# Patient Record
Sex: Female | Born: 1985 | Race: Black or African American | Hispanic: No | Marital: Married | Smoking: Never smoker
Health system: Southern US, Community
[De-identification: ages and names within clinical notes are randomized; demographics above are authoritative.]

## PROBLEM LIST (undated history)

## (undated) ENCOUNTER — Inpatient Hospital Stay (HOSPITAL_COMMUNITY): Payer: Self-pay

## (undated) DIAGNOSIS — M25569 Pain in unspecified knee: Secondary | ICD-10-CM

## (undated) DIAGNOSIS — F419 Anxiety disorder, unspecified: Secondary | ICD-10-CM

## (undated) DIAGNOSIS — M2550 Pain in unspecified joint: Secondary | ICD-10-CM

## (undated) DIAGNOSIS — N979 Female infertility, unspecified: Secondary | ICD-10-CM

## (undated) DIAGNOSIS — N390 Urinary tract infection, site not specified: Secondary | ICD-10-CM

## (undated) DIAGNOSIS — R002 Palpitations: Secondary | ICD-10-CM

## (undated) DIAGNOSIS — K5900 Constipation, unspecified: Secondary | ICD-10-CM

## (undated) DIAGNOSIS — E559 Vitamin D deficiency, unspecified: Secondary | ICD-10-CM

## (undated) DIAGNOSIS — O139 Gestational [pregnancy-induced] hypertension without significant proteinuria, unspecified trimester: Secondary | ICD-10-CM

## (undated) DIAGNOSIS — D649 Anemia, unspecified: Secondary | ICD-10-CM

## (undated) DIAGNOSIS — N289 Disorder of kidney and ureter, unspecified: Secondary | ICD-10-CM

## (undated) DIAGNOSIS — M1990 Unspecified osteoarthritis, unspecified site: Secondary | ICD-10-CM

## (undated) DIAGNOSIS — O165 Unspecified maternal hypertension, complicating the puerperium: Secondary | ICD-10-CM

## (undated) HISTORY — DX: Female infertility, unspecified: N97.9

## (undated) HISTORY — DX: Unspecified osteoarthritis, unspecified site: M19.90

## (undated) HISTORY — DX: Pain in unspecified joint: M25.50

## (undated) HISTORY — DX: Palpitations: R00.2

## (undated) HISTORY — DX: Unspecified maternal hypertension, complicating the puerperium: O16.5

## (undated) HISTORY — DX: Urinary tract infection, site not specified: N39.0

## (undated) HISTORY — DX: Constipation, unspecified: K59.00

## (undated) HISTORY — DX: Vitamin D deficiency, unspecified: E55.9

## (undated) HISTORY — PX: INCISE AND DRAIN ABCESS: PRO64

## (undated) HISTORY — DX: Disorder of kidney and ureter, unspecified: N28.9

## (undated) HISTORY — DX: Anemia, unspecified: D64.9

## (undated) HISTORY — DX: Gestational (pregnancy-induced) hypertension without significant proteinuria, unspecified trimester: O13.9

## (undated) HISTORY — DX: Pain in unspecified knee: M25.569

## (undated) HISTORY — DX: Anxiety disorder, unspecified: F41.9

---

## 2016-08-28 ENCOUNTER — Encounter: Payer: Self-pay | Admitting: Family

## 2016-08-28 ENCOUNTER — Ambulatory Visit (INDEPENDENT_AMBULATORY_CARE_PROVIDER_SITE_OTHER): Payer: 59 | Admitting: Family

## 2016-08-28 VITALS — BP 116/70 | HR 49 | Temp 98.1°F | Ht 69.0 in | Wt 216.4 lb

## 2016-08-28 DIAGNOSIS — Z0000 Encounter for general adult medical examination without abnormal findings: Secondary | ICD-10-CM | POA: Insufficient documentation

## 2016-08-28 LAB — TSH: TSH: 0.85 u[IU]/mL (ref 0.35–4.50)

## 2016-08-28 LAB — CBC WITH DIFFERENTIAL/PLATELET
BASOS PCT: 0.7 % (ref 0.0–3.0)
Basophils Absolute: 0 10*3/uL (ref 0.0–0.1)
EOS PCT: 1.6 % (ref 0.0–5.0)
Eosinophils Absolute: 0.1 10*3/uL (ref 0.0–0.7)
HCT: 37.2 % (ref 36.0–46.0)
HEMOGLOBIN: 12.2 g/dL (ref 12.0–15.0)
LYMPHS PCT: 38.3 % (ref 12.0–46.0)
Lymphs Abs: 2.2 10*3/uL (ref 0.7–4.0)
MCHC: 32.8 g/dL (ref 30.0–36.0)
MCV: 84 fl (ref 78.0–100.0)
Monocytes Absolute: 0.4 10*3/uL (ref 0.1–1.0)
Monocytes Relative: 6.9 % (ref 3.0–12.0)
NEUTROS PCT: 52.5 % (ref 43.0–77.0)
Neutro Abs: 3 10*3/uL (ref 1.4–7.7)
Platelets: 202 10*3/uL (ref 150.0–400.0)
RBC: 4.43 Mil/uL (ref 3.87–5.11)
RDW: 13.6 % (ref 11.5–15.5)
WBC: 5.6 10*3/uL (ref 4.0–10.5)

## 2016-08-28 LAB — COMPREHENSIVE METABOLIC PANEL
ALBUMIN: 4.2 g/dL (ref 3.5–5.2)
ALK PHOS: 36 U/L — AB (ref 39–117)
ALT: 10 U/L (ref 0–35)
AST: 15 U/L (ref 0–37)
BUN: 16 mg/dL (ref 6–23)
CHLORIDE: 103 meq/L (ref 96–112)
CO2: 29 mEq/L (ref 19–32)
CREATININE: 0.77 mg/dL (ref 0.40–1.20)
Calcium: 9.5 mg/dL (ref 8.4–10.5)
GFR: 91.98 mL/min (ref >60.00)
Glucose, Bld: 89 mg/dL (ref 70–99)
Potassium: 3.7 mEq/L (ref 3.5–5.1)
SODIUM: 138 meq/L (ref 135–145)
TOTAL PROTEIN: 7 g/dL (ref 6.0–8.3)
Total Bilirubin: 0.4 mg/dL (ref 0.2–1.2)

## 2016-08-28 LAB — VITAMIN D 25 HYDROXY (VIT D DEFICIENCY, FRACTURES): VITD: 23 ng/mL — ABNORMAL LOW (ref 30.00–100.00)

## 2016-08-28 LAB — LIPID PANEL
CHOL/HDL RATIO: 3
Cholesterol: 173 mg/dL (ref 0–200)
HDL: 59.1 mg/dL (ref >39.00)
LDL Cholesterol: 106 mg/dL — ABNORMAL HIGH (ref 0–99)
NonHDL: 114.05
Triglycerides: 40 mg/dL (ref 0.0–149.0)
VLDL: 8 mg/dL (ref 0.0–40.0)

## 2016-08-28 LAB — HEMOGLOBIN A1C: HEMOGLOBIN A1C: 5.6 % (ref 4.6–6.5)

## 2016-08-28 NOTE — Progress Notes (Signed)
Pre visit review using our clinic review tool, if applicable. No additional management support is needed unless otherwise documented below in the visit note. 

## 2016-08-28 NOTE — Progress Notes (Signed)
Subjective:    Patient ID: Jamie Bean, female    DOB: 12-29-1985, 31 y.o.   MRN: [id number]  CC: Jamie Bean is a 31 y.o. female who presents today for physical exam.    HPI: Prior care at been at [facility] clinic in [place]  Trying to conceive. On PNVs. '  Always had low resting HR. Excercises daily.     Colorectal Cancer Screening: No early family history Breast Cancer Screening: No early family history Cervical Cancer Screening: UTD, per patient; [facility]        Tetanus - unsure  HIV Screening- Candidate for .  Labs: Screening labs today. Exercise: Gets regular exercise.  Alcohol use: occasinal Smoking/tobacco use: Nonsmoker.  Regular dental exams: In need of dental exam. Wears seat belt: Yes. Skin: no concerning skin lesions; no h/o skin cancer. Follows with dermatologist.     HISTORY:  No past medical history on file.  No past surgical history on file. Family History  Problem Relation Age of Onset  . Renal cancer Maternal Grandmother 70  . Colon cancer Neg Hx   . Breast cancer Neg Hx       ALLERGIES: Other  No current outpatient prescriptions on file prior to visit.   No current facility-administered medications on file prior to visit.     Social History  Substance Use Topics  . Smoking status: Never Smoker  . Smokeless tobacco: Never Used  . Alcohol use Yes     Comment: occasional    Review of Systems  Constitutional: Negative for chills, fever and unexpected weight change.  HENT: Negative for congestion.   Respiratory: Negative for cough.   Cardiovascular: Negative for chest pain, palpitations and leg swelling.  Gastrointestinal: Negative for nausea and vomiting.  Musculoskeletal: Negative for arthralgias and myalgias.  Skin: Negative for rash.  Neurological: Negative for dizziness, syncope and headaches.  Hematological: Negative for adenopathy.  Psychiatric/Behavioral: Negative for confusion.      Objective:      BP 116/70   Pulse (!) 49   Temp 98.1 F (36.7 C) (Oral)   Ht  (1.753 m)   Wt 216 lb 6.4 oz (98.2 kg)   SpO2 99%   BMI 31.96 kg/m   BP Readings from Last 3 Encounters:  08/28/16 116/70   Wt Readings from Last 3 Encounters:  08/28/16 216 lb 6.4 oz (98.2 kg)    Physical Exam  Constitutional: She appears well-developed and well-nourished.  Eyes: Conjunctivae are normal.  Neck: No thyroid mass and no thyromegaly present.  Cardiovascular: Normal rate, regular rhythm, normal heart sounds and normal pulses.   Pulmonary/Chest: Effort normal and breath sounds normal. She has no wheezes. She has no rhonchi. She has no rales. Right breast exhibits no inverted nipple, no mass, no nipple discharge, no skin change and no tenderness. Left breast exhibits no inverted nipple, no mass, no nipple discharge, no skin change and no tenderness. Breasts are symmetrical.  CBE performed.   Lymphadenopathy:       Head (right side): No submental, no submandibular, no tonsillar, no preauricular, no posterior auricular and no occipital adenopathy present.       Head (left side): No submental, no submandibular, no tonsillar, no preauricular, no posterior auricular and no occipital adenopathy present.    She has no cervical adenopathy.       Right cervical: No superficial cervical, no deep cervical and no posterior cervical adenopathy present.      Left cervical: No  superficial cervical, no deep cervical and no posterior cervical adenopathy present.    She has no axillary adenopathy.  Neurological: She is alert.  Skin: Skin is warm and dry.  Psychiatric: She has a normal mood and affect. Her speech is normal and behavior is normal. Thought content normal.  Vitals reviewed.      Assessment & Plan:   Problem List Items Addressed This Visit      Other   Routine physical examination - Primary    No early family history of breast cancer, colon cancer. Pap smear normal and up-to-date per patient. She  will continue to follow with OB/GYN. Encouraged continued exercise. Advise Tdap vaccine at local pharmacy. Breast exam performed today. Screening labs.      Relevant Orders   CBC with Differential/Platelet   Comprehensive metabolic panel   Hemoglobin A1c   Lipid panel   TSH   VITAMIN D 25 Hydroxy (Vit-D Deficiency, Fractures)       Ms. Folz does not currently have medications on file.   No orders of the defined types were placed in this encounter.   Return precautions given.   Risks, benefits, and alternatives of the medications and treatment plan prescribed today were discussed, and patient expressed understanding.   Education regarding symptom management and diagnosis given to patient on AVS.   Continue to follow with Rennie Plowman, FNP for routine health maintenance.   Suzi Roots and I agreed with plan.   Rennie Plowman, FNP

## 2016-08-28 NOTE — Patient Instructions (Addendum)
Pleasure meeting you  Stay on prenatal.  Labs today  Health Maintenance, Female Adopting a healthy lifestyle and getting preventive care can go a long way to promote health and wellness. Talk with your health care provider about what schedule of regular examinations is right for you. This is a good chance for you to check in with your provider about disease prevention and staying healthy. In between checkups, there are plenty of things you can do on your own. Experts have done a lot of research about which lifestyle changes and preventive measures are most likely to keep you healthy. Ask your health care provider for more information. Weight and diet Eat a healthy diet  Be sure to include plenty of vegetables, fruits, low-fat dairy products, and lean protein.  Do not eat a lot of foods high in solid fats, added sugars, or salt.  Get regular exercise. This is one of the most important things you can do for your health.  Most adults should exercise for at least 150 minutes each week. The exercise should increase your heart rate and make you sweat (moderate-intensity exercise).  Most adults should also do strengthening exercises at least twice a week. This is in addition to the moderate-intensity exercise. Maintain a healthy weight  Body mass index (BMI) is a measurement that can be used to identify possible weight problems. It estimates body fat based on height and weight. Your health care provider can help determine your BMI and help you achieve or maintain a healthy weight.  For females 75 years of age and older:  A BMI below 18.5 is considered underweight.  A BMI of 18.5 to 24.9 is normal.  A BMI of 25 to 29.9 is considered overweight.  A BMI of 30 and above is considered obese. Watch levels of cholesterol and blood lipids  You should start having your blood tested for lipids and cholesterol at 33 years of age, then have this test every 5 years.  You may need to have your  cholesterol levels checked more often if:  Your lipid or cholesterol levels are high.  You are older than 33 years of age.  You are at high risk for heart disease. Cancer screening Lung Cancer  Lung cancer screening is recommended for adults 27-23 years old who are at high risk for lung cancer because of a history of smoking.  A yearly low-dose CT scan of the lungs is recommended for people who:  Currently smoke.  Have quit within the past 15 years.  Have at least a 30-pack-year history of smoking. A pack year is smoking an average of one pack of cigarettes a day for 1 year.  Yearly screening should continue until it has been 15 years since you quit.  Yearly screening should stop if you develop a health problem that would prevent you from having lung cancer treatment. Breast Cancer  Practice breast self-awareness. This means understanding how your breasts normally appear and feel.  It also means doing regular breast self-exams. Let your health care provider know about any changes, no matter how small.  If you are in your 20s or 30s, you should have a clinical breast exam (CBE) by a health care provider every 1-3 years as part of a regular health exam.  If you are 42 or older, have a CBE every year. Also consider having a breast X-ray (mammogram) every year.  If you have a family history of breast cancer, talk to your health care provider about genetic screening.  screening.  If you are at high risk for breast cancer, talk to your health care provider about having an MRI and a mammogram every year.  Breast cancer gene (BRCA) assessment is recommended for women who have family members with BRCA-related cancers. BRCA-related cancers include:  Breast.  Ovarian.  Tubal.  Peritoneal cancers.  Results of the assessment will determine the need for genetic counseling and BRCA1 and BRCA2 testing. Cervical Cancer  Your health care provider may recommend that you be screened regularly for  cancer of the pelvic organs (ovaries, uterus, and vagina). This screening involves a pelvic examination, including checking for microscopic changes to the surface of your cervix (Pap test). You may be encouraged to have this screening done every 3 years, beginning at age 21.  For women ages 30-65, health care providers may recommend pelvic exams and Pap testing every 3 years, or they may recommend the Pap and pelvic exam, combined with testing for human papilloma virus (HPV), every 5 years. Some types of HPV increase your risk of cervical cancer. Testing for HPV may also be done on women of any age with unclear Pap test results.  Other health care providers may not recommend any screening for nonpregnant women who are considered low risk for pelvic cancer and who do not have symptoms. Ask your health care provider if a screening pelvic exam is right for you.  If you have had past treatment for cervical cancer or a condition that could lead to cancer, you need Pap tests and screening for cancer for at least 20 years after your treatment. If Pap tests have been discontinued, your risk factors (such as having a new sexual partner) need to be reassessed to determine if screening should resume. Some women have medical problems that increase the chance of getting cervical cancer. In these cases, your health care provider may recommend more frequent screening and Pap tests. Colorectal Cancer  This type of cancer can be detected and often prevented.  Routine colorectal cancer screening usually begins at 33 years of age and continues through 33 years of age.  Your health care provider may recommend screening at an earlier age if you have risk factors for colon cancer.  Your health care provider may also recommend using home test kits to check for hidden blood in the stool.  A small camera at the end of a tube can be used to examine your colon directly (sigmoidoscopy or colonoscopy). This is done to check for  the earliest forms of colorectal cancer.  Routine screening usually begins at age 50.  Direct examination of the colon should be repeated every 5-10 years through 33 years of age. However, you may need to be screened more often if early forms of precancerous polyps or small growths are found. Skin Cancer  Check your skin from head to toe regularly.  Tell your health care provider about any new moles or changes in moles, especially if there is a change in a mole's shape or color.  Also tell your health care provider if you have a mole that is larger than the size of a pencil eraser.  Always use sunscreen. Apply sunscreen liberally and repeatedly throughout the day.  Protect yourself by wearing long sleeves, pants, a wide-brimmed hat, and sunglasses whenever you are outside. Heart disease, diabetes, and high blood pressure  High blood pressure causes heart disease and increases the risk of stroke. High blood pressure is more likely to develop in:  People who have blood pressure   in the high end of the normal range (130-139/85-89 mm Hg).  People who are overweight or obese.  People who are African American.  If you are 18-39 years of age, have your blood pressure checked every 3-5 years. If you are 40 years of age or older, have your blood pressure checked every year. You should have your blood pressure measured twice-once when you are at a hospital or clinic, and once when you are not at a hospital or clinic. Record the average of the two measurements. To check your blood pressure when you are not at a hospital or clinic, you can use:  An automated blood pressure machine at a pharmacy.  A home blood pressure monitor.  If you are between 55 years and 79 years old, ask your health care provider if you should take aspirin to prevent strokes.  Have regular diabetes screenings. This involves taking a blood sample to check your fasting blood sugar level.  If you are at a normal weight and  have a low risk for diabetes, have this test once every three years after 33 years of age.  If you are overweight and have a high risk for diabetes, consider being tested at a younger age or more often. Preventing infection Hepatitis B  If you have a higher risk for hepatitis B, you should be screened for this virus. You are considered at high risk for hepatitis B if:  You were born in a country where hepatitis B is common. Ask your health care provider which countries are considered high risk.  Your parents were born in a high-risk country, and you have not been immunized against hepatitis B (hepatitis B vaccine).  You have HIV or AIDS.  You use needles to inject street drugs.  You live with someone who has hepatitis B.  You have had sex with someone who has hepatitis B.  You get hemodialysis treatment.  You take certain medicines for conditions, including cancer, organ transplantation, and autoimmune conditions. Hepatitis C  Blood testing is recommended for:  Everyone born from 1945 through 1965.  Anyone with known risk factors for hepatitis C. Sexually transmitted infections (STIs)  You should be screened for sexually transmitted infections (STIs) including gonorrhea and chlamydia if:  You are sexually active and are younger than 33 years of age.  You are older than 33 years of age and your health care provider tells you that you are at risk for this type of infection.  Your sexual activity has changed since you were last screened and you are at an increased risk for chlamydia or gonorrhea. Ask your health care provider if you are at risk.  If you do not have HIV, but are at risk, it may be recommended that you take a prescription medicine daily to prevent HIV infection. This is called pre-exposure prophylaxis (PrEP). You are considered at risk if:  You are sexually active and do not regularly use condoms or know the HIV status of your partner(s).  You take drugs by  injection.  You are sexually active with a partner who has HIV. Talk with your health care provider about whether you are at high risk of being infected with HIV. If you choose to begin PrEP, you should first be tested for HIV. You should then be tested every 3 months for as long as you are taking PrEP. Pregnancy  If you are premenopausal and you may become pregnant, ask your health care provider about preconception counseling.  If you   pregnant, take 400 to 800 micrograms (mcg) of folic acid every day.  If you want to prevent pregnancy, talk to your health care provider about birth control (contraception). Osteoporosis and menopause  Osteoporosis is a disease in which the bones lose minerals and strength with aging. This can result in serious bone fractures. Your risk for osteoporosis can be identified using a bone density scan.  If you are 78 years of age or older, or if you are at risk for osteoporosis and fractures, ask your health care provider if you should be screened.  Ask your health care provider whether you should take a calcium or vitamin D supplement to lower your risk for osteoporosis.  Menopause may have certain physical symptoms and risks.  Hormone replacement therapy may reduce some of these symptoms and risks. Talk to your health care provider about whether hormone replacement therapy is right for you. Follow these instructions at home:  Schedule regular health, dental, and eye exams.  Stay current with your immunizations.  Do not use any tobacco products including cigarettes, chewing tobacco, or electronic cigarettes.  If you are pregnant, do not drink alcohol.  If you are breastfeeding, limit how much and how often you drink alcohol.  Limit alcohol intake to no more than 1 drink per day for nonpregnant women. One drink equals 12 ounces of beer, 5 ounces of wine, or 1 ounces of hard liquor.  Do not use street drugs.  Do not share needles.  Ask  your health care provider for help if you need support or information about quitting drugs.  Tell your health care provider if you often feel depressed.  Tell your health care provider if you have ever been abused or do not feel safe at home. This information is not intended to replace advice given to you by your health care provider. Make sure you discuss any questions you have with your health care provider. Document Released: 11/07/2010 Document Revised: 09/30/2015 Document Reviewed: 01/26/2015 Elsevier Interactive Patient Education  2017 [employer].

## 2016-08-28 NOTE — Assessment & Plan Note (Addendum)
No early family history of breast cancer, colon cancer. Pap smear normal and up-to-date per patient. She will continue to follow with OB/GYN. Encouraged continued exercise. Advise Tdap vaccine at local pharmacy. Breast exam performed today. Screening labs.

## 2017-08-31 ENCOUNTER — Ambulatory Visit (INDEPENDENT_AMBULATORY_CARE_PROVIDER_SITE_OTHER): Payer: 59 | Admitting: Family

## 2017-08-31 ENCOUNTER — Other Ambulatory Visit: Payer: Self-pay | Admitting: Family

## 2017-08-31 ENCOUNTER — Encounter: Payer: 59 | Admitting: Family

## 2017-08-31 ENCOUNTER — Encounter: Payer: Self-pay | Admitting: Family

## 2017-08-31 VITALS — BP 110/82 | HR 60 | Temp 98.6°F | Ht 68.75 in | Wt 225.2 lb

## 2017-08-31 DIAGNOSIS — M25561 Pain in right knee: Secondary | ICD-10-CM | POA: Diagnosis not present

## 2017-08-31 DIAGNOSIS — G8929 Other chronic pain: Secondary | ICD-10-CM | POA: Diagnosis not present

## 2017-08-31 DIAGNOSIS — R7989 Other specified abnormal findings of blood chemistry: Secondary | ICD-10-CM

## 2017-08-31 DIAGNOSIS — Z0000 Encounter for general adult medical examination without abnormal findings: Secondary | ICD-10-CM | POA: Diagnosis not present

## 2017-08-31 DIAGNOSIS — Z23 Encounter for immunization: Secondary | ICD-10-CM | POA: Diagnosis not present

## 2017-08-31 DIAGNOSIS — M25562 Pain in left knee: Secondary | ICD-10-CM | POA: Diagnosis not present

## 2017-08-31 DIAGNOSIS — R001 Bradycardia, unspecified: Secondary | ICD-10-CM

## 2017-08-31 LAB — LIPID PANEL
CHOLESTEROL: 167 mg/dL (ref 0–200)
HDL: 51.8 mg/dL (ref >39.00)
LDL Cholesterol: 107 mg/dL — ABNORMAL HIGH (ref 0–99)
NONHDL: 114.79
TRIGLYCERIDES: 38 mg/dL (ref 0.0–149.0)
Total CHOL/HDL Ratio: 3
VLDL: 7.6 mg/dL (ref 0.0–40.0)

## 2017-08-31 LAB — VITAMIN D 25 HYDROXY (VIT D DEFICIENCY, FRACTURES): VITD: 57.03 ng/mL (ref 30.00–100.00)

## 2017-08-31 LAB — COMPREHENSIVE METABOLIC PANEL
ALBUMIN: 3.8 g/dL (ref 3.5–5.2)
ALK PHOS: 31 U/L — AB (ref 39–117)
ALT: 9 U/L (ref 0–35)
AST: 13 U/L (ref 0–37)
BUN: 14 mg/dL (ref 6–23)
CALCIUM: 8.8 mg/dL (ref 8.4–10.5)
CO2: 27 mEq/L (ref 19–32)
Chloride: 106 mEq/L (ref 96–112)
Creatinine, Ser: 0.82 mg/dL (ref 0.40–1.20)
GFR: 85.01 mL/min (ref >60.00)
Glucose, Bld: 90 mg/dL (ref 70–99)
Potassium: 4.3 mEq/L (ref 3.5–5.1)
Sodium: 140 mEq/L (ref 135–145)
TOTAL PROTEIN: 6.6 g/dL (ref 6.0–8.3)
Total Bilirubin: 0.4 mg/dL (ref 0.2–1.2)

## 2017-08-31 LAB — CBC WITH DIFFERENTIAL/PLATELET
BASOS PCT: 0.6 % (ref 0.0–3.0)
Basophils Absolute: 0 10*3/uL (ref 0.0–0.1)
Eosinophils Absolute: 0.1 10*3/uL (ref 0.0–0.7)
Eosinophils Relative: 1.8 % (ref 0.0–5.0)
HCT: 34.9 % — ABNORMAL LOW (ref 36.0–46.0)
HEMOGLOBIN: 11.6 g/dL — AB (ref 12.0–15.0)
Lymphocytes Relative: 24.1 % (ref 12.0–46.0)
Lymphs Abs: 1.2 10*3/uL (ref 0.7–4.0)
MCHC: 33.2 g/dL (ref 30.0–36.0)
MCV: 83.8 fl (ref 78.0–100.0)
MONOS PCT: 9.8 % (ref 3.0–12.0)
Monocytes Absolute: 0.5 10*3/uL (ref 0.1–1.0)
Neutro Abs: 3.3 10*3/uL (ref 1.4–7.7)
Neutrophils Relative %: 63.7 % (ref 43.0–77.0)
Platelets: 198 10*3/uL (ref 150.0–400.0)
RBC: 4.17 Mil/uL (ref 3.87–5.11)
RDW: 14 % (ref 11.5–15.5)
WBC: 5.2 10*3/uL (ref 4.0–10.5)

## 2017-08-31 LAB — HEMOGLOBIN A1C: HEMOGLOBIN A1C: 5.4 % (ref 4.6–6.5)

## 2017-08-31 LAB — TSH: TSH: 0.89 u[IU]/mL (ref 0.35–4.50)

## 2017-08-31 NOTE — Assessment & Plan Note (Signed)
Pap UTD and follows with OB GYN, defers pelvic exam today in the absence of complaints. Advised baseline mammogram in a couple of years. Tdap boosted. Weight loss referral placed. Recommendations for dental practices given to patient.

## 2017-08-31 NOTE — Progress Notes (Signed)
Subjective:    Patient ID: Jamie RootsShiquita Bean, female    DOB: [birth year], 32 y.o.   MRN: [id number]  CC: Jamie KanarisShiquita Renard Bean is a 32 y.o. female who presents today for physical exam.    HPI: Chronic bilateral knee pain- used to run half marathons; now running 4 days per week 3 miles. Gets arrevated by [employer]-with jumping. When looses weight, knee pain improves. Painful to sit on knees or sitting Bangladeshindian style. Some use of ice. No ibuprofen.   Bradycardia-  Mother has low heart rate as well. Denies exertional chest pain or pressure, numbness or tingling radiating to left arm or jaw, palpitations, dizziness, frequent headaches, changes in vision, or shortness of breath.   Notes her uncle recently passed away age 34, she thinks it was cardiac related.  Unsure if it was sudden cardiac death.  Currently undergoing infertility treatment.   Colorectal Cancer Screening: no early family history Breast Cancer Screening: no early family history Cervical Cancer Screening: UTD.  Bone Health screening/DEXA for 65+: No increased fracture risk. Defer screening at this time. Lung Cancer Screening: Doesn't have 30 year pack year history and age > 55 years.       Tetanus - due Labs: Screening labs today. Exercise: Gets regular exercise.  Alcohol use: occasional Smoking/tobacco use: Nonsmoker.  Regular dental exams: In need of dental exam. Wears seat belt: Yes. Skin: no new lesions; no h/o skin cancer.   HISTORY:  Past Medical History:  Diagnosis Date  . Arthritis   . UTI (urinary tract infection)     History reviewed. No pertinent surgical history. Family History  Problem Relation Age of Onset  . Renal cancer Maternal Grandmother 70  . Alcohol abuse Maternal Grandmother   . Hyperlipidemia Maternal Grandmother   . Hypertension Maternal Grandmother   . Arthritis Father   . Alcohol abuse Maternal Uncle   . Mental illness Maternal Uncle   . Mental illness Paternal Aunt   . Diabetes Paternal Aunt     . Hyperlipidemia Maternal Grandfather   . Hypertension Maternal Grandfather   . Stroke Paternal Grandmother   . Diabetes Paternal Grandmother   . Cancer Paternal Grandfather        lung  . Colon cancer Neg Hx   . Breast cancer Neg Hx       ALLERGIES: Other and Sulfa antibiotics  Current Outpatient Medications on File Prior to Visit  Medication Sig Dispense Refill  . Biotin 1 MG CAPS Take 1 mg by mouth daily.    . Cholecalciferol (VITAMIN D3) 5000 units TABS Take by mouth daily.    [name]. co-enzyme Q-10 30 MG capsule Take 30 mg by mouth daily.    . ferrous sulfate 325 (65 FE) MG tablet Take 325 mg by mouth daily.    . Lactobacillus (PROBIOTIC ACIDOPHILUS PO) Take 1 capsule by mouth daily.    . Multiple Vitamin (MULTIVITAMIN) tablet Take by mouth.     No current facility-administered medications on file prior to visit.     Social History   Tobacco Use  . Smoking status: Never Smoker  . Smokeless tobacco: Never Used  Substance Use Topics  . Alcohol use: Yes    Comment: occasional  . Drug use: No    Review of Systems  Constitutional: Negative for chills, fever and unexpected weight change.  HENT: Negative for congestion.   Respiratory: Negative for cough.   Cardiovascular: Negative for chest pain, palpitations and leg swelling.  Gastrointestinal: Negative for nausea and vomiting.  Genitourinary: Negative for pelvic pain and vaginal pain.  Musculoskeletal: Negative for arthralgias, joint swelling and myalgias.  Skin: Negative for rash.  Neurological: Negative for dizziness, syncope and headaches.  Hematological: Negative for adenopathy.  Psychiatric/Behavioral: Negative for confusion.      Objective:    BP 110/82 (BP Location: Left Arm, Patient Position: Sitting, Cuff Size: Large)   Pulse 60   Temp 98.6 F (37 C) (Oral)   Ht 5' 8.75" (1.746 m)   Wt 225 lb 4 oz (102.2 kg)   LMP 08/30/2017   SpO2 98%   BMI 33.51 kg/m   BP Readings from Last 3 Encounters:   08/31/17 110/82  08/28/16 116/70   Wt Readings from Last 3 Encounters:  08/31/17 225 lb 4 oz (102.2 kg)  08/28/16 216 lb 6.4 oz (98.2 kg)    Physical Exam  Constitutional: She appears well-developed and well-nourished.  Eyes: Conjunctivae are normal.  Neck: No thyroid mass and no thyromegaly present.  Cardiovascular: Regular rhythm, normal heart sounds and normal pulses. Bradycardia present.  Pulmonary/Chest: Effort normal and breath sounds normal. She has no wheezes. She has no rhonchi. She has no rales. Right breast exhibits no inverted nipple, no mass, no nipple discharge, no skin change and no tenderness. Left breast exhibits no inverted nipple, no mass, no nipple discharge, no skin change and no tenderness. Breasts are symmetrical.  CBE performed.   Musculoskeletal:       Right knee: She exhibits normal range of motion, no swelling and no effusion. No tenderness found.       Left knee: She exhibits normal range of motion, no swelling and no effusion. No tenderness found.  Lymphadenopathy:       Head (right side): No submental, no submandibular, no tonsillar, no preauricular, no posterior auricular and no occipital adenopathy present.       Head (left side): No submental, no submandibular, no tonsillar, no preauricular, no posterior auricular and no occipital adenopathy present.    She has no cervical adenopathy.       Right cervical: No superficial cervical, no deep cervical and no posterior cervical adenopathy present.      Left cervical: No superficial cervical, no deep cervical and no posterior cervical adenopathy present.    She has no axillary adenopathy.  Neurological: She is alert.  Skin: Skin is warm and dry.  Psychiatric: She has a normal mood and affect. Her speech is normal and behavior is normal. Thought content normal.  Vitals reviewed.      Assessment & Plan:   Problem List Items Addressed This Visit      Other   Routine physical examination - Primary    Pap  UTD and follows with OB GYN, defers pelvic exam today in the absence of complaints. Advised baseline mammogram in a couple of years. Tdap boosted. Weight loss referral placed. Recommendations for dental practices given to patient.       Relevant Orders   Amb Ref to Medical Weight Management   Ambulatory referral to Infertility   CBC with Differential/Platelet   Comprehensive metabolic panel   Hemoglobin A1c   Lipid panel   TSH   VITAMIN D 25 Hydroxy (Vit-D Deficiency, Fractures)   Ambulatory referral to Cardiology   Bradycardia    HR fluctuates from 49-60 during exam today. Suspect from avid exercise, history of running. However in context of uncles death and persistence, we agreed cardiac consult appropriate. Reassured as no syncope, CP, SOB. Will follow.  Relevant Orders   Ambulatory referral to Cardiology   Chronic pain of both knees    Chronic. Suspect overuse syndrome in context of weight gain. Politely declines imaging today. We agreed weight loss and conservative measures at home ( ice, ACE wrap) appropriate steps. If no improvement, she will let me know and we will consult sports medicine. Will follow      Relevant Orders   Amb Ref to Medical Weight Management   Need for diphtheria-tetanus-pertussis (Tdap) vaccine   Relevant Orders   Tdap vaccine greater than or equal to 7yo IM (Completed)       I am having Jamie Bean maintain her Lactobacillus (PROBIOTIC ACIDOPHILUS PO), Biotin, ferrous sulfate, multivitamin, Vitamin D3, and co-enzyme Q-10.   No orders of the defined types were placed in this encounter.   Return precautions given.   Risks, benefits, and alternatives of the medications and treatment plan prescribed today were discussed, and patient expressed understanding.   Education regarding symptom management and diagnosis given to patient on AVS.   Continue to follow with Allegra Grana, FNP for routine health maintenance.   Jamie Bean and I  agreed with plan.   Rennie Plowman, FNP

## 2017-08-31 NOTE — Assessment & Plan Note (Signed)
HR fluctuates from 49-60 during exam today. Suspect from avid exercise, history of running. However in context of uncles death and persistence, we agreed cardiac consult appropriate. Reassured as no syncope, CP, SOB. Will follow.

## 2017-08-31 NOTE — Patient Instructions (Addendum)
[facility]  ACE wrap, ice at least 20 minutes after workouts.  If no improvement, please let me know and we will consult sports medicine.  Today we discussed referrals, orders.  Cardiology , weight loss  management, infertility   I have placed these orders in the system for you.  Please be sure to give [place] a call if you have not heard from our office regarding scheduling a test or regarding referral in a timely manner.  It is very important that you let me know as soon as possible.   Health Maintenance, Female Adopting a healthy lifestyle and getting preventive care can go a long way to promote health and wellness. Talk with your health care provider about what schedule of regular examinations is right for you. This is a good chance for you to check in with your provider about disease prevention and staying healthy. In between checkups, there are plenty of things you can do on your own. Experts have done a lot of research about which lifestyle changes and preventive measures are most likely to keep you healthy. Ask your health care provider for more information. Weight and diet Eat a healthy diet  Be sure to include plenty of vegetables, fruits, low-fat dairy products, and lean protein.  Do not eat a lot of foods high in solid fats, added sugars, or salt.  Get regular exercise. This is one of the most important things you can do for your health. ? Most adults should exercise for at least 150 minutes each week. The exercise should increase your heart rate and make you sweat (moderate-intensity exercise). ? Most adults should also do strengthening exercises at least twice a week. This is in addition to the moderate-intensity exercise.  Maintain a healthy weight  Body mass index (BMI) is a measurement that can be used to identify possible weight problems. It estimates body fat based on height and weight. Your health care provider can help determine your BMI and  help you achieve or maintain a healthy weight.  For females 66 years of age and older: ? A BMI below 18.5 is considered underweight. ? A BMI of 18.5 to 24.9 is normal. ? A BMI of 25 to 29.9 is considered overweight. ? A BMI of 30 and above is considered obese.  Watch levels of cholesterol and blood lipids  You should start having your blood tested for lipids and cholesterol at 34 years of age, then have this test every 5 years.  You may need to have your cholesterol levels checked more often if: ? Your lipid or cholesterol levels are high. ? You are older than 34 years of age. ? You are at high risk for heart disease.  Cancer screening Lung Cancer  Lung cancer screening is recommended for adults 53-48 years old who are at high risk for lung cancer because of a history of smoking.  A yearly low-dose CT scan of the lungs is recommended for people who: ? Currently smoke. ? Have quit within the past 15 years. ? Have at least a 30-pack-year history of smoking. A pack year is smoking an average of one pack of cigarettes a day for 1 year.  Yearly screening should continue until it has been 15 years since you quit.  Yearly screening should stop if you develop a health problem that would prevent you from having lung cancer treatment.  Breast Cancer  Practice breast self-awareness. This means understanding how your breasts normally appear and feel.  feel.  It also means doing regular breast self-exams. Let your health care provider know about any changes, no matter how small.  If you are in your 20s or 30s, you should have a clinical breast exam (CBE) by a health care provider every 1-3 years as part of a regular health exam.  If you are 40 or older, have a CBE every year. Also consider having a breast X-ray (mammogram) every year.  If you have a family history of breast cancer, talk to your health care provider about genetic screening.  If you are at high risk for breast cancer, talk to  your health care provider about having an MRI and a mammogram every year.  Breast cancer gene (BRCA) assessment is recommended for women who have family members with BRCA-related cancers. BRCA-related cancers include: ? Breast. ? Ovarian. ? Tubal. ? Peritoneal cancers.  Results of the assessment will determine the need for genetic counseling and BRCA1 and BRCA2 testing.  Cervical Cancer Your health care provider may recommend that you be screened regularly for cancer of the pelvic organs (ovaries, uterus, and vagina). This screening involves a pelvic examination, including checking for microscopic changes to the surface of your cervix (Pap test). You may be encouraged to have this screening done every 3 years, beginning at age 21.  For women ages 30-65, health care providers may recommend pelvic exams and Pap testing every 3 years, or they may recommend the Pap and pelvic exam, combined with testing for human papilloma virus (HPV), every 5 years. Some types of HPV increase your risk of cervical cancer. Testing for HPV may also be done on women of any age with unclear Pap test results.  Other health care providers may not recommend any screening for nonpregnant women who are considered low risk for pelvic cancer and who do not have symptoms. Ask your health care provider if a screening pelvic exam is right for you.  If you have had past treatment for cervical cancer or a condition that could lead to cancer, you need Pap tests and screening for cancer for at least 20 years after your treatment. If Pap tests have been discontinued, your risk factors (such as having a new sexual partner) need to be reassessed to determine if screening should resume. Some women have medical problems that increase the chance of getting cervical cancer. In these cases, your health care provider may recommend more frequent screening and Pap tests.  Colorectal Cancer  This type of cancer can be detected and often  prevented.  Routine colorectal cancer screening usually begins at 34 years of age and continues through 34 years of age.  Your health care provider may recommend screening at an earlier age if you have risk factors for colon cancer.  Your health care provider may also recommend using home test kits to check for hidden blood in the stool.  A small camera at the end of a tube can be used to examine your colon directly (sigmoidoscopy or colonoscopy). This is done to check for the earliest forms of colorectal cancer.  Routine screening usually begins at age 50.  Direct examination of the colon should be repeated every 5-10 years through 34 years of age. However, you may need to be screened more often if early forms of precancerous polyps or small growths are found.  Skin Cancer  Check your skin from head to toe regularly.  Tell your health care provider about any new moles or changes in moles, especially if there   change in a mole's shape or color.  Also tell your health care provider if you have a mole that is larger than the size of a pencil eraser.  Always use sunscreen. Apply sunscreen liberally and repeatedly throughout the day.  Protect yourself by wearing long sleeves, pants, a wide-brimmed hat, and sunglasses whenever you are outside.  Heart disease, diabetes, and high blood pressure  High blood pressure causes heart disease and increases the risk of stroke. High blood pressure is more likely to develop in: ? People who have blood pressure in the high end of the normal range (130-139/85-89 mm Hg). ? People who are overweight or obese. ? People who are African American.  If you are 79-13 years of age, have your blood pressure checked every 3-5 years. If you are 37 years of age or older, have your blood pressure checked every year. You should have your blood pressure measured twice-once when you are at a hospital or clinic, and once when you are not at a hospital or clinic.  Record the average of the two measurements. To check your blood pressure when you are not at a hospital or clinic, you can use: ? An automated blood pressure machine at a pharmacy. ? A home blood pressure monitor.  If you are between 71 years and 51 years old, ask your health care provider if you should take aspirin to prevent strokes.  Have regular diabetes screenings. This involves taking a blood sample to check your fasting blood sugar level. ? If you are at a normal weight and have a low risk for diabetes, have this test once every three years after 34 years of age. ? If you are overweight and have a high risk for diabetes, consider being tested at a younger age or more often. Preventing infection Hepatitis B  If you have a higher risk for hepatitis B, you should be screened for this virus. You are considered at high risk for hepatitis B if: ? You were born in a country where hepatitis B is common. Ask your health care provider which countries are considered high risk. ? Your parents were born in a high-risk country, and you have not been immunized against hepatitis B (hepatitis B vaccine). ? You have HIV or AIDS. ? You use needles to inject street drugs. ? You live with someone who has hepatitis B. ? You have had sex with someone who has hepatitis B. ? You get hemodialysis treatment. ? You take certain medicines for conditions, including cancer, organ transplantation, and autoimmune conditions.  Hepatitis C  Blood testing is recommended for: ? Everyone born from [birth year] through 1967. ? Anyone with known risk factors for hepatitis C.  Sexually transmitted infections (STIs)  You should be screened for sexually transmitted infections (STIs) including gonorrhea and chlamydia if: ? You are sexually active and are younger than 34 years of age. ? You are older than 35 years of age and your health care provider tells you that you are at risk for this type of infection. ? Your sexual  activity has changed since you were last screened and you are at an increased risk for chlamydia or gonorrhea. Ask your health care provider if you are at risk.  If you do not have HIV, but are at risk, it may be recommended that you take a prescription medicine daily to prevent HIV infection. This is called pre-exposure prophylaxis (PrEP). You are considered at risk if: ? You are sexually active and do not regularly  use condoms or know the HIV status of your partner(s). ? You take drugs by injection. ? You are sexually active with a partner who has HIV.  Talk with your health care provider about whether you are at high risk of being infected with HIV. If you choose to begin PrEP, you should first be tested for HIV. You should then be tested every 3 months for as long as you are taking PrEP. Pregnancy  If you are premenopausal and you may become pregnant, ask your health care provider about preconception counseling.  If you may become pregnant, take 400 to 800 micrograms (mcg) of folic acid every day.  If you want to prevent pregnancy, talk to your health care provider about birth control (contraception). Osteoporosis and menopause  Osteoporosis is a disease in which the bones lose minerals and strength with aging. This can result in serious bone fractures. Your risk for osteoporosis can be identified using a bone density scan.  If you are 89 years of age or older, or if you are at risk for osteoporosis and fractures, ask your health care provider if you should be screened.  Ask your health care provider whether you should take a calcium or vitamin D supplement to lower your risk for osteoporosis.  Menopause may have certain physical symptoms and risks.  Hormone replacement therapy may reduce some of these symptoms and risks. Talk to your health care provider about whether hormone replacement therapy is right for you. Follow these instructions at home:  Schedule regular health, dental,  and eye exams.  Stay current with your immunizations.  Do not use any tobacco products including cigarettes, chewing tobacco, or electronic cigarettes.  If you are pregnant, do not drink alcohol.  If you are breastfeeding, limit how much and how often you drink alcohol.  Limit alcohol intake to no more than 1 drink per day for nonpregnant women. One drink equals 12 ounces of beer, 5 ounces of wine, or 1 ounces of hard liquor.  Do not use street drugs.  Do not share needles.  Ask your health care provider for help if you need support or information about quitting drugs.  Tell your health care provider if you often feel depressed.  Tell your health care provider if you have ever been abused or do not feel safe at home. This information is not intended to replace advice given to you by your health care provider. Make sure you discuss any questions you have with your health care provider. Document Released: 11/07/2010 Document Revised: 09/30/2015 Document Reviewed: 01/26/2015 Elsevier Interactive Patient Education  [employer].

## 2017-08-31 NOTE — Progress Notes (Signed)
close

## 2017-08-31 NOTE — Assessment & Plan Note (Signed)
Chronic. Suspect overuse syndrome in context of weight gain. Politely declines imaging today. We agreed weight loss and conservative measures at home ( ice, ACE wrap) appropriate steps. If no improvement, she will let me know and we will consult sports medicine. Will follow

## 2017-09-14 ENCOUNTER — Encounter: Payer: 59 | Admitting: Family

## 2017-09-25 ENCOUNTER — Encounter (INDEPENDENT_AMBULATORY_CARE_PROVIDER_SITE_OTHER): Payer: Self-pay

## 2017-10-09 ENCOUNTER — Ambulatory Visit (INDEPENDENT_AMBULATORY_CARE_PROVIDER_SITE_OTHER): Payer: 59 | Admitting: Family Medicine

## 2017-10-09 ENCOUNTER — Encounter (INDEPENDENT_AMBULATORY_CARE_PROVIDER_SITE_OTHER): Payer: Self-pay | Admitting: Family Medicine

## 2017-10-09 VITALS — BP 116/80 | HR 57 | Temp 97.9°F | Ht 69.0 in | Wt 218.0 lb

## 2017-10-09 DIAGNOSIS — E7849 Other hyperlipidemia: Secondary | ICD-10-CM

## 2017-10-09 DIAGNOSIS — Z9189 Other specified personal risk factors, not elsewhere classified: Secondary | ICD-10-CM | POA: Diagnosis not present

## 2017-10-09 DIAGNOSIS — E559 Vitamin D deficiency, unspecified: Secondary | ICD-10-CM | POA: Insufficient documentation

## 2017-10-09 DIAGNOSIS — R739 Hyperglycemia, unspecified: Secondary | ICD-10-CM | POA: Insufficient documentation

## 2017-10-09 DIAGNOSIS — Z0289 Encounter for other administrative examinations: Secondary | ICD-10-CM

## 2017-10-09 DIAGNOSIS — R0602 Shortness of breath: Secondary | ICD-10-CM | POA: Diagnosis not present

## 2017-10-09 DIAGNOSIS — E785 Hyperlipidemia, unspecified: Secondary | ICD-10-CM | POA: Insufficient documentation

## 2017-10-09 DIAGNOSIS — E669 Obesity, unspecified: Secondary | ICD-10-CM | POA: Diagnosis not present

## 2017-10-09 DIAGNOSIS — E782 Mixed hyperlipidemia: Secondary | ICD-10-CM | POA: Insufficient documentation

## 2017-10-09 DIAGNOSIS — Z1331 Encounter for screening for depression: Secondary | ICD-10-CM

## 2017-10-09 DIAGNOSIS — Z6832 Body mass index (BMI) 32.0-32.9, adult: Secondary | ICD-10-CM

## 2017-10-09 NOTE — Progress Notes (Signed)
Office: [phone]  /  Fax: [phone]   Dear Lyn Records. Arnett, FNP,   Thank you for referring [name] to our clinic. The following note includes my evaluation and treatment recommendations.  HPI:   Chief Complaint: OBESITY    Jamie Bean has been referred by Lyn Records. Jason Coop, FNP for consultation regarding her obesity and obesity related comorbidities.    Jamie Bean (MR# [id number]) is a 32 y.o. female who presents on 10/09/2017 for obesity evaluation and treatment. Current BMI is Body mass index is 32.19 kg/m.Jamie Bean has been struggling with her weight for many years and has been unsuccessful in either losing weight, maintaining weight loss, or reaching her healthy weight goal.     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 She states she has a history of exercise bulimia approximately 6 years ago.     Jamie Bean attended our information session and states she is currently in the action stage of change and ready to dedicate time achieving and maintaining a healthier weight. Jamie Bean is interested in becoming our patient and working on intensive lifestyle modifications including (but not limited to) diet, exercise and weight loss.    Jamie Bean states her family eats meals together she thinks her family will eat healthier with  her her desired weight loss is 33 lbs she has been heavy most of  her life she started gaining weight she has been overweight all her life her heaviest weight ever was 238 lbs she has significant food cravings issues  she is frequently drinking liquids with calories she frequently makes poor food choices she frequently eats larger portions than normal    Jamie Bean on exertion Jamie Bean notes increasing shortness of breath with exercising and seems to be worsening over time with weight gain. She notes getting out of breath sooner with activity than she used to. This has not gotten worse recently. Jamie Bean denies  orthopnea.  Hyperlipidemia Jamie Bean has hyperlipidemia and has been trying to improve her cholesterol levels with intensive lifestyle modification including a low saturated fat diet, exercise and weight loss. LDL slightly elevated in the past but HDL and triglycerides within normal limits, she is not on statin. She denies any chest pain, claudication or myalgias.  At risk for cardiovascular disease Jamie Bean is at a higher than average risk for cardiovascular disease due to obesity and hyperlipidemia. She currently denies any chest pain.  Vitamin D Deficiency Jamie Bean has a diagnosis of vitamin D deficiency. She is not on Vit D, no recent levels. She notes fatigue and denies nausea, vomiting or muscle weakness.  Hyperglycemia Jamie Bean has a history of previous A1c slightly elevated without a diagnosis of diabetes. She denies polyphagia.  Depression Screen Jamie Bean's Food and Mood (modified PHQ-9) score was  Depression screen PHQ 2/9 10/09/2017  Decreased Interest 1  Down, Depressed, Hopeless 1  PHQ - 2 Score 2  Altered sleeping 0  Tired, decreased energy 0  Change in appetite 0  Feeling bad or failure about yourself  1  Trouble concentrating 0  Moving slowly or fidgety/restless 0  Suicidal thoughts 0  PHQ-9 Score 3  Difficult doing work/chores Not difficult at all    ALLERGIES: Allergies  Allergen Reactions  . Other Hives  . Sulfa Antibiotics Hives    MEDICATIONS: Current Outpatient Medications on File Prior to Visit  Medication Sig Dispense Refill  . Biotin 1 MG CAPS Take 1 mg by mouth daily.    . Cholecalciferol (VITAMIN D3) 5000 units TABS Take by  mouth daily.    Jamie Kitchen co-enzyme Q-10 30 MG capsule Take 30 mg by mouth daily.    . ferrous sulfate 325 (65 FE) MG tablet Take 325 mg by mouth daily.    . Lactobacillus (PROBIOTIC ACIDOPHILUS PO) Take 1 capsule by mouth daily.    . Melatonin 1 MG CAPS Take 1 capsule by mouth at bedtime.    . Multiple Vitamin (MULTIVITAMIN) tablet  Take by mouth.     No current facility-administered medications on file prior to visit.     PAST MEDICAL HISTORY: Past Medical History:  Diagnosis Date  . Anemia   . Anxiety   . Arthritis   . Constipation   . Joint pain   . Kidney problem   . Knee pain   . Palpitations   . UTI (urinary tract infection)   . Vitamin D deficiency     PAST SURGICAL HISTORY: Past Surgical History:  Procedure Laterality Date  . INCISE AND DRAIN ABCESS     1994    SOCIAL HISTORY: Social History   Tobacco Use  . Smoking status: Never Smoker  . Smokeless tobacco: Never Used  Substance Use Topics  . Alcohol use: Yes    Comment: occasional  . Drug use: No    FAMILY HISTORY: Family History  Problem Relation Age of Onset  . Renal cancer Maternal Grandmother 70  . Alcohol abuse Maternal Grandmother   . Hyperlipidemia Maternal Grandmother   . Hypertension Maternal Grandmother   . Arthritis Father   . Obesity Father   . Alcohol abuse Maternal Uncle   . Mental illness Maternal Uncle   . Mental illness Paternal Aunt   . Diabetes Paternal Aunt   . Hyperlipidemia Maternal Grandfather   . Hypertension Maternal Grandfather   . Stroke Paternal Grandmother   . Diabetes Paternal Grandmother   . Cancer Paternal Grandfather        lung  . Colon cancer Neg Hx   . Breast cancer Neg Hx     ROS: Review of Systems  Constitutional: Positive for malaise/fatigue. Negative for weight loss.  HENT:       + Nasal stuffiness  Respiratory: Positive for cough and shortness of breath (with exertion).   Cardiovascular: Positive for palpitations. Negative for chest pain, orthopnea and claudication.  Gastrointestinal: Negative for nausea and vomiting.  Musculoskeletal:       Negative muscle weakness +Muscle or joint pain  Endo/Heme/Allergies:       + Heat/cold intolerance    PHYSICAL EXAM: Blood pressure 116/80, pulse (!) 57, temperature 97.9 F (36.6 C), temperature source Oral, height 5\' 9"  (1.753  m), weight 218 lb (98.9 kg), last menstrual period 09/26/2017, SpO2 99 %. Body mass index is 32.19 kg/m. Physical Exam  Constitutional: She is oriented to person, place, and time. She appears well-developed and well-nourished.  HENT:  Head: Normocephalic and atraumatic.  Nose: Nose normal.  Eyes: EOM are normal. No scleral icterus.  Neck: Normal range of motion. Neck supple. No thyromegaly present.  Cardiovascular: Normal rate and regular rhythm.  Pulmonary/Chest: Effort normal. No respiratory distress.  Abdominal: Soft. There is no tenderness.  + Obesity  Musculoskeletal:  Range of Motion normal in all 4 extremities Trace edema noted in bilateral lower extremities  Neurological: She is alert and oriented to person, place, and time. Coordination normal.  Skin: Skin is warm and dry.  Psychiatric: She has a normal mood and affect. Her behavior is normal.  Vitals reviewed.   RECENT LABS AND  TESTS: BMET    Component Value Date/Time   NA 140 08/31/2017 0929   K 4.3 08/31/2017 0929   CL 106 08/31/2017 0929   CO2 27 08/31/2017 0929   GLUCOSE 90 08/31/2017 0929   BUN 14 08/31/2017 0929   CREATININE 0.82 08/31/2017 0929   CALCIUM 8.8 08/31/2017 0929   Lab Results  Component Value Date   HGBA1C 5.4 08/31/2017   No results found for: INSULIN CBC    Component Value Date/Time   WBC 5.2 08/31/2017 0929   RBC 4.17 08/31/2017 0929   HGB 11.6 (L) 08/31/2017 0929   HCT 34.9 (L) 08/31/2017 0929   PLT 198.0 08/31/2017 0929   MCV 83.8 08/31/2017 0929   MCHC 33.2 08/31/2017 0929   RDW 14.0 08/31/2017 0929   LYMPHSABS 1.2 08/31/2017 0929   MONOABS 0.5 08/31/2017 0929   EOSABS 0.1 08/31/2017 0929   BASOSABS 0.0 08/31/2017 0929   Iron/TIBC/Ferritin/ %Sat No results found for: IRON, TIBC, FERRITIN, IRONPCTSAT Lipid Panel     Component Value Date/Time   CHOL 167 08/31/2017 0929   TRIG 38.0 08/31/2017 0929   HDL 51.80 08/31/2017 0929   CHOLHDL 3 08/31/2017 0929   VLDL 7.6  08/31/2017 0929   LDLCALC 107 (H) 08/31/2017 0929   Hepatic Function Panel     Component Value Date/Time   PROT 6.6 08/31/2017 0929   ALBUMIN 3.8 08/31/2017 0929   AST 13 08/31/2017 0929   ALT 9 08/31/2017 0929   ALKPHOS 31 (L) 08/31/2017 0929   BILITOT 0.4 08/31/2017 0929      Component Value Date/Time   TSH 0.89 08/31/2017 0929   TSH 0.85 08/28/2016 1402    ECG  shows NSR with a rate of 59 BPM INDIRECT CALORIMETER done today shows a VO2 of 210 and a REE of 1463.  Her calculated basal metabolic rate is 1610 thus her basal metabolic rate is worse than expected.    ASSESSMENT AND PLAN: Shortness of breath on exertion - Plan: EKG 12-Lead, CBC With Differential  Other hyperlipidemia - Plan: Lipid Panel With LDL/HDL Ratio  Vitamin D deficiency - Plan: Vitamin B12, Folate, VITAMIN D 25 Hydroxy (Vit-D Deficiency, Fractures)  Hyperglycemia - Plan: Hemoglobin A1c, Insulin, random, Comprehensive metabolic panel  Depression screening  At risk for heart disease  Class 1 obesity with serious comorbidity and body mass index (BMI) of 32.0 to 32.9 in adult, unspecified obesity type  PLAN:  Jamie Bean on exertion Reighlynn's shortness of breath appears to be obesity related and exercise induced. She has agreed to work on weight loss and gradually increase exercise to treat her exercise induced shortness of breath. If Tyia follows our instructions and loses weight without improvement of her shortness of breath, we will plan to refer to pulmonology. We will monitor this condition regularly. Anushri agrees to this plan.  Hyperlipidemia Yaniyah was informed of the American Heart Association Guidelines emphasizing intensive lifestyle modifications as the first line treatment for hyperlipidemia. We discussed many lifestyle modifications today in depth, and Kadiatou will continue to work on decreasing saturated fats such as fatty red meat, butter and many fried foods. She will start diet  prescription, also increase vegetables and lean protein in her diet and continue to work on exercise and weight loss efforts. We will check labs and Justene agrees to follow up with our clinic in 2 weeks.  Cardiovascular risk counselling Damian was given extended (15 minutes) coronary artery disease prevention counseling today. She is 32 y.o. female and has risk  factors for heart disease including obesity and hyperlipidemia. We discussed intensive lifestyle modifications today with an emphasis on specific weight loss instructions and strategies. Pt was also informed of the importance of increasing exercise and decreasing saturated fats to help prevent heart disease.  Vitamin D Deficiency Gracie was informed that low vitamin D levels contributes to fatigue and are associated with obesity, breast, and colon cancer. She will follow up for routine testing of vitamin D, at least 2-3 times per year. She was informed of the risk of over-replacement of vitamin D and agrees to not increase her dose unless she discusses this with us first. We will check labs and Janayah agrees to follow up with our clinic in 2 weeks.  Hyperglycemia Fasting labs will be obtained and results with be discussed with Synda in 2 weeks at her follow up visit. In the meanwhile Akia will start diet prescription and will work on weight loss efforts.  Depression Screen Nelia had a negative depression screening. Depression is commonly associated with obesity and often results in emotional eating behaviors. We will monitor this closely and work on CBT to help improve the non-hunger eating patterns. Referral to Psychology may be required if no improvement is seen as she continues in our clinic.  Obesity Cherae is currently in the action stage of change and her goal is to continue with weight loss efforts. I recommend Renalda begin the structured treatment plan as follows:  She has agreed to follow the Category 2 plan +  100 calories Celine has been instructed to eventually work up to a goal of 150 minutes of combined cardio and strengthening exercise per week for weight loss and overall health benefits. We discussed the following Behavioral Modification Strategies today: increasing lean protein intake, decreasing simple carbohydrates  and work on meal planning and easy cooking plans   She was informed of the importance of frequent follow up visits to maximize her success with intensive lifestyle modifications for her multiple health conditions. She was informed we would discuss her lab results at her next visit unless there is a critical issue that needs to be addressed sooner. Sonja agreed to keep her next visit at the agreed upon time to discuss these results.    OBESITY BEHAVIORAL INTERVENTION VISIT  Today's visit was # 1 out of 22.  Starting weight: 218 lbs Starting date: 10/09/17 Today's weight : 218 lbs Today's date: 10/09/2017 Total lbs lost to date: 0 (Patients must lose 7 lbs in the first 6 months to continue with counseling)   ASK: We discussed the diagnosis of obesity with Suzi RootsShiquita Bechler today and Ranae agreed to give us permission to discuss obesity behavioral modification therapy today.  ASSESS: Burnett KanarisShiquita has the diagnosis of obesity and her BMI today is 32.18 Arbell is in the action stage of change   ADVISE: Burnett KanarisShiquita was educated on the multiple health risks of obesity as well as the benefit of weight loss to improve her health. She was advised of the need for long term treatment and the importance of lifestyle modifications.  AGREE: Multiple dietary modification options and treatment options were discussed and  Shacoya agreed to the above obesity treatment plan.   I, Burt KnackSharon Martin, am acting as transcriptionist for Quillian Quincearen Saathvik Every, MD    I have reviewed the above documentation for accuracy and completeness, and I agree with the above. -Quillian Quincearen Tanishi Nault, MD

## 2017-10-10 LAB — COMPREHENSIVE METABOLIC PANEL
ALK PHOS: 37 IU/L — AB (ref 39–117)
ALT: 10 IU/L (ref 0–32)
AST: 16 IU/L (ref 0–40)
Albumin/Globulin Ratio: 1.7 (ref 1.2–2.2)
Albumin: 4.2 g/dL (ref 3.5–5.5)
BUN / CREAT RATIO: 16 (ref 9–23)
BUN: 13 mg/dL (ref 6–20)
Bilirubin Total: 0.3 mg/dL (ref 0.0–1.2)
CHLORIDE: 102 mmol/L (ref 96–106)
CO2: 24 mmol/L (ref 20–29)
CREATININE: 0.82 mg/dL (ref 0.57–1.00)
Calcium: 9 mg/dL (ref 8.7–10.2)
GFR calc Af Amer: 109 mL/min/{1.73_m2} (ref >59)
GFR calc non Af Amer: 94 mL/min/{1.73_m2} (ref >59)
GLUCOSE: 84 mg/dL (ref 65–99)
Globulin, Total: 2.5 g/dL (ref 1.5–4.5)
Potassium: 4.3 mmol/L (ref 3.5–5.2)
Sodium: 140 mmol/L (ref 134–144)
Total Protein: 6.7 g/dL (ref 6.0–8.5)

## 2017-10-10 LAB — CBC WITH DIFFERENTIAL
BASOS ABS: 0 10*3/uL (ref 0.0–0.2)
Basos: 0 %
EOS (ABSOLUTE): 0.1 10*3/uL (ref 0.0–0.4)
Eos: 2 %
HEMOGLOBIN: 11.8 g/dL (ref 11.1–15.9)
Hematocrit: 36 % (ref 34.0–46.6)
IMMATURE GRANS (ABS): 0 10*3/uL (ref 0.0–0.1)
IMMATURE GRANULOCYTES: 0 %
LYMPHS ABS: 1.5 10*3/uL (ref 0.7–3.1)
LYMPHS: 26 %
MCH: 26.6 pg (ref 26.6–33.0)
MCHC: 32.8 g/dL (ref 31.5–35.7)
MCV: 81 fL (ref 79–97)
MONOCYTES: 6 %
Monocytes Absolute: 0.3 10*3/uL (ref 0.1–0.9)
NEUTROS PCT: 66 %
Neutrophils Absolute: 3.8 10*3/uL (ref 1.4–7.0)
RBC: 4.43 x10E6/uL (ref 3.77–5.28)
RDW: 13.9 % (ref 12.3–15.4)
WBC: 5.7 10*3/uL (ref 3.4–10.8)

## 2017-10-10 LAB — HEMOGLOBIN A1C
Est. average glucose Bld gHb Est-mCnc: 111 mg/dL
HEMOGLOBIN A1C: 5.5 % (ref 4.8–5.6)

## 2017-10-10 LAB — LIPID PANEL WITH LDL/HDL RATIO
Cholesterol, Total: 185 mg/dL (ref 100–199)
HDL: 59 mg/dL (ref >39)
LDL CALC: 115 mg/dL — AB (ref 0–99)
LDl/HDL Ratio: 1.9 ratio (ref 0.0–3.2)
Triglycerides: 57 mg/dL (ref 0–149)
VLDL Cholesterol Cal: 11 mg/dL (ref 5–40)

## 2017-10-10 LAB — INSULIN, RANDOM: INSULIN: 5.8 u[IU]/mL (ref 2.6–24.9)

## 2017-10-10 LAB — VITAMIN D 25 HYDROXY (VIT D DEFICIENCY, FRACTURES): VIT D 25 HYDROXY: 77.6 ng/mL (ref 30.0–100.0)

## 2017-10-10 LAB — VITAMIN B12: VITAMIN B 12: 881 pg/mL (ref 232–1245)

## 2017-10-10 LAB — FOLATE: FOLATE: 19.3 ng/mL (ref >3.0)

## 2017-10-17 ENCOUNTER — Encounter: Payer: Self-pay | Admitting: Obstetrics and Gynecology

## 2017-10-20 NOTE — Progress Notes (Addendum)
Cardiology Office Note  Date:  10/23/2017   ID:  Jamie Bean, DOB 09/03/1985, MRN [id number]  PCP:  Allegra GranaArnett, Margaret G, FNP   Chief Complaint  Patient presents with  . other    Bradycardia c/o heart palpitations. Meds reviewed verbally with pt.    HPI:  Jamie Bean is a 32 year old woman with past medical history of SOB Obesity, morbid Who presents by referral from Rennie PlowmanMargaret Arnett for bradycardia  Prior office visit April 2018, With Rennie PlowmanMargaret Arnett Pulse rate 49 with blood pressure 116/70, reports that she was asymptomatic Periodically with heart rate in the high 50s  She does use a pulse monitor wristwatch when she works out heart rate frequent up to 140 bpm when she does heavy work out, Psychologist, educationalZumba. Denies any shortness of breath chest pain on exertion avid runner in the past  She is working with the [facility] to lose weight Changed her diet, exercising on a regular basis  Reports her uncle passed age 34, exact etiology unclear, possibly cardiac but details uncertain No other close relatives siblings parents with major cardiac issues  Reports mother may have low heart rate Is asymptomatic at separate 2:00 in the morning to go work out  EKG personally reviewed by myself on todays visit Shows normal sinus rhythm rate 63 bpm no significant ST or T-wave changes   PMH:   has a past medical history of Anemia, Anxiety, Arthritis, Constipation, Joint pain, Kidney problem, Knee pain, Palpitations, UTI (urinary tract infection), and Vitamin D deficiency.  PSH:    Past Surgical History:  Procedure Laterality Date  . INCISE AND DRAIN ABCESS     1994    Current Outpatient Medications  Medication Sig Dispense Refill  . Biotin 1 MG CAPS Take 1 mg by mouth daily.    . Cholecalciferol (VITAMIN D3) 5000 units TABS Take by mouth daily.    [name]. co-enzyme Q-10 30 MG capsule Take 30 mg by mouth daily.    . ferrous sulfate 325 (65 FE) MG tablet Take 325 mg by mouth daily.    .  Lactobacillus (PROBIOTIC ACIDOPHILUS PO) Take 1 capsule by mouth daily.    . Melatonin 1 MG CAPS Take 1 capsule by mouth at bedtime.    . Multiple Vitamin (MULTIVITAMIN) tablet Take by mouth.     No current facility-administered medications for this visit.      Allergies:   Other and Sulfa antibiotics   Social History:  The patient  reports that she has never smoked. She has never used smokeless tobacco. She reports that she drinks alcohol. She reports that she does not use drugs.   Family History:   family history includes Alcohol abuse in her maternal grandmother and maternal uncle; Arthritis in her father; Cancer in her paternal grandfather; Diabetes in her paternal aunt and paternal grandmother; Hyperlipidemia in her maternal grandfather and maternal grandmother; Hypertension in her maternal grandfather and maternal grandmother; Mental illness in her maternal uncle and paternal aunt; Obesity in her father; Renal cancer (age of onset: 2570) in her maternal grandmother; Stroke in her paternal grandmother.    Review of Systems: Review of Systems  Constitutional: Negative.   Respiratory: Negative.   Cardiovascular: Negative.   Gastrointestinal: Negative.   Musculoskeletal: Negative.   Neurological: Negative.   Psychiatric/Behavioral: Negative.   All other systems reviewed and are negative.    PHYSICAL EXAM: VS:  BP 116/70 (BP Location: Right Arm, Patient Position: Sitting, Cuff Size: Large)   Pulse 63   Ht  5\' 9"  (1.753 m)   Wt 217 lb 8 oz (98.7 kg)   LMP 09/26/2017 (Exact Date)   BMI 32.12 kg/m  , BMI Body mass index is 32.12 kg/m. GEN: Well nourished, well developed, in no acute distress  HEENT: normal  Neck: no JVD, carotid bruits, or masses Cardiac: RRR; no murmurs, rubs, or gallops,no edema  Respiratory:  clear to auscultation bilaterally, normal work of breathing GI: soft, nontender, nondistended, + BS MS: no deformity or atrophy  Skin: warm and dry, no rash Neuro:   Strength and sensation are intact Psych: euthymic mood, full affect    Recent Labs: 08/31/2017: Platelets 198.0; TSH 0.89 10/09/2017: ALT 10; BUN 13; Creatinine, Ser 0.82; Hemoglobin 11.8; Potassium 4.3; Sodium 140    Lipid Panel Lab Results  Component Value Date   CHOL 185 10/09/2017   HDL 59 10/09/2017   LDLCALC 115 (H) 10/09/2017   TRIG 57 10/09/2017      Wt Readings from Last 3 Encounters:  10/23/17 217 lb 8 oz (98.7 kg)  10/09/17 218 lb (98.9 kg)  08/31/17 225 lb 4 oz (102.2 kg)       ASSESSMENT AND PLAN:  Bradycardia - Plan: EKG 12-Lead Asymptomatic bradycardia No further workup needed at this time Good chronotropic competence, Especially noted with workouts  Hyperglycemia Recent lab work showing sugar 80s Complemented on recent weight loss and habits  Other hyperlipidemia Cholesterol within reasonable range, no medication needed No significant risk factors for cardiac disease  Shortness of breath on exertion - Plan: EKG 12-Lead Previous notes indicating shortness of breath on exertion but she reports having good exercise tolerance, aggressive workout regimen No further workup needed  Disposition:   F/U  As needed   Total encounter time more than 45 minutes  Greater than 50% was spent in counseling and coordination of care with the patient   Orders Placed This Encounter  Procedures  . EKG 12-Lead     Signed, Dossie Arbour, M.D., Ph.D. 10/23/2017  [facility] [place], [place] [phone]

## 2017-10-23 ENCOUNTER — Encounter: Payer: Self-pay | Admitting: Cardiovascular Disease

## 2017-10-23 ENCOUNTER — Ambulatory Visit: Payer: 59 | Admitting: Cardiovascular Disease

## 2017-10-23 VITALS — BP 116/70 | HR 63 | Ht 69.0 in | Wt 217.5 lb

## 2017-10-23 DIAGNOSIS — R0602 Shortness of breath: Secondary | ICD-10-CM

## 2017-10-23 DIAGNOSIS — R739 Hyperglycemia, unspecified: Secondary | ICD-10-CM

## 2017-10-23 DIAGNOSIS — E7849 Other hyperlipidemia: Secondary | ICD-10-CM

## 2017-10-23 DIAGNOSIS — R001 Bradycardia, unspecified: Secondary | ICD-10-CM

## 2017-10-23 NOTE — Patient Instructions (Signed)

## 2017-10-24 ENCOUNTER — Ambulatory Visit (INDEPENDENT_AMBULATORY_CARE_PROVIDER_SITE_OTHER): Payer: 59 | Admitting: Family Medicine

## 2017-10-24 VITALS — BP 110/71 | HR 56 | Temp 98.1°F | Ht 69.0 in | Wt 212.0 lb

## 2017-10-24 DIAGNOSIS — E669 Obesity, unspecified: Secondary | ICD-10-CM

## 2017-10-24 DIAGNOSIS — Z6831 Body mass index (BMI) 31.0-31.9, adult: Secondary | ICD-10-CM | POA: Diagnosis not present

## 2017-10-24 DIAGNOSIS — E7849 Other hyperlipidemia: Secondary | ICD-10-CM

## 2017-10-24 NOTE — Progress Notes (Signed)
Office: [phone]  /  Fax: [phone]   HPI:   Chief Complaint: OBESITY Jamie Bean is here to discuss her progress with her obesity treatment plan. She is on the Category 2 plan + 100 calories and is following her eating plan approximately 80 % of the time. She states she is doing zumba for 45-60 minutes 3-4 times per week. [name] has done well with weight loss on Category 2 plan. She had some celebration eating and temptations. She was able to eat all her food and hunger was mostly controlled. She is get bored with her plan.  Her weight is 212 lb (96.2 kg) today and has had a weight loss of 6 pounds over a period of 2 weeks since her last visit. She has lost 6 lbs since starting treatment with us.  Hyperlipidemia [name] has hyperlipidemia, her LDL is elevated but HDL and triglycerides are within normal limits. She is not on statin and she denies  any chest pain, claudication or myalgias. She would like to try to improve her cholesterol levels with intensive lifestyle modification including a low saturated fat diet, exercise and weight loss.   ALLERGIES: Allergies  Allergen Reactions  . Other Hives  . Sulfa Antibiotics Hives    MEDICATIONS: Current Outpatient Medications on File Prior to Visit  Medication Sig Dispense Refill  . Biotin 1 MG CAPS Take 1 mg by mouth daily.    . Cholecalciferol (VITAMIN D3) 5000 units TABS Take by mouth daily.    [name]. co-enzyme Q-10 30 MG capsule Take 30 mg by mouth daily.    . ferrous sulfate 325 (65 FE) MG tablet Take 325 mg by mouth daily.    . Lactobacillus (PROBIOTIC ACIDOPHILUS PO) Take 1 capsule by mouth daily.    . Melatonin 1 MG CAPS Take 1 capsule by mouth at bedtime.    . Multiple Vitamin (MULTIVITAMIN) tablet Take by mouth.     No current facility-administered medications on file prior to visit.     PAST MEDICAL HISTORY: Past Medical History:  Diagnosis Date  . Anemia   . Anxiety   . Arthritis   . Constipation   . Joint pain     . Kidney problem   . Knee pain   . Palpitations   . UTI (urinary tract infection)   . Vitamin D deficiency     PAST SURGICAL HISTORY: Past Surgical History:  Procedure Laterality Date  . INCISE AND DRAIN ABCESS     1994    SOCIAL HISTORY: Social History   Tobacco Use  . Smoking status: Never Smoker  . Smokeless tobacco: Never Used  Substance Use Topics  . Alcohol use: Yes    Comment: occasional  . Drug use: No    FAMILY HISTORY: Family History  Problem Relation Age of Onset  . Renal cancer Maternal Grandmother 70  . Alcohol abuse Maternal Grandmother   . Hyperlipidemia Maternal Grandmother   . Hypertension Maternal Grandmother   . Arthritis Father   . Obesity Father   . Alcohol abuse Maternal Uncle   . Mental illness Maternal Uncle   . Mental illness Paternal Aunt   . Diabetes Paternal Aunt   . Hyperlipidemia Maternal Grandfather   . Hypertension Maternal Grandfather   . Stroke Paternal Grandmother   . Diabetes Paternal Grandmother   . Cancer Paternal Grandfather        lung  . Colon cancer Neg Hx   . Breast cancer Neg Hx     ROS: Review  of Systems  Constitutional: Positive for weight loss.  Cardiovascular: Negative for chest pain and claudication.  Musculoskeletal: Negative for myalgias.    PHYSICAL EXAM: Blood pressure 110/71, pulse (!) 56, temperature 98.1 F (36.7 C), temperature source Oral, height 5\' 9"  (1.753 m), weight 212 lb (96.2 kg), last menstrual period 09/26/2017, SpO2 99 %. Body mass index is 31.31 kg/m. Physical Exam  Constitutional: She is oriented to person, place, and time. She appears well-developed and well-nourished.  Cardiovascular: Normal rate.  Pulmonary/Chest: Effort normal.  Musculoskeletal: Normal range of motion.  Neurological: She is oriented to person, place, and time.  Skin: Skin is warm and dry.  Psychiatric: She has a normal mood and affect. Her behavior is normal.  Vitals reviewed.   RECENT LABS AND  TESTS: BMET    Component Value Date/Time   NA 140 10/09/2017 0910   K 4.3 10/09/2017 0910   CL 102 10/09/2017 0910   CO2 24 10/09/2017 0910   GLUCOSE 84 10/09/2017 0910   GLUCOSE 90 08/31/2017 0929   BUN 13 10/09/2017 0910   CREATININE 0.82 10/09/2017 0910   CALCIUM 9.0 10/09/2017 0910   GFRNONAA 94 10/09/2017 0910   GFRAA 109 10/09/2017 0910   Lab Results  Component Value Date   HGBA1C 5.5 10/09/2017   HGBA1C 5.4 08/31/2017   HGBA1C 5.6 08/28/2016   Lab Results  Component Value Date   INSULIN 5.8 10/09/2017   CBC    Component Value Date/Time   WBC 5.7 10/09/2017 0910   WBC 5.2 08/31/2017 0929   RBC 4.43 10/09/2017 0910   RBC 4.17 08/31/2017 0929   HGB 11.8 10/09/2017 0910   HCT 36.0 10/09/2017 0910   PLT 198.0 08/31/2017 0929   MCV 81 10/09/2017 0910   MCH 26.6 10/09/2017 0910   MCHC 32.8 10/09/2017 0910   MCHC 33.2 08/31/2017 0929   RDW 13.9 10/09/2017 0910   LYMPHSABS 1.5 10/09/2017 0910   MONOABS 0.5 08/31/2017 0929   EOSABS 0.1 10/09/2017 0910   BASOSABS 0.0 10/09/2017 0910   Iron/TIBC/Ferritin/ %Sat No results found for: IRON, TIBC, FERRITIN, IRONPCTSAT Lipid Panel     Component Value Date/Time   CHOL 185 10/09/2017 0910   TRIG 57 10/09/2017 0910   HDL 59 10/09/2017 0910   CHOLHDL 3 08/31/2017 0929   VLDL 7.6 08/31/2017 0929   LDLCALC 115 (H) 10/09/2017 0910   Hepatic Function Panel     Component Value Date/Time   PROT 6.7 10/09/2017 0910   ALBUMIN 4.2 10/09/2017 0910   AST 16 10/09/2017 0910   ALT 10 10/09/2017 0910   ALKPHOS 37 (L) 10/09/2017 0910   BILITOT 0.3 10/09/2017 0910      Component Value Date/Time   TSH 0.89 08/31/2017 0929   TSH 0.85 08/28/2016 1402    ASSESSMENT AND PLAN: Other hyperlipidemia  Class 1 obesity with serious comorbidity and body mass index (BMI) of 31.0 to 31.9 in adult, unspecified obesity type  PLAN:  Hyperlipidemia [name] was informed of the American Heart Association Guidelines emphasizing  intensive lifestyle modifications as the first line treatment for hyperlipidemia. We discussed many lifestyle modifications today in depth, and Honey will continue to work on decreasing saturated fats such as fatty red meat, butter and many fried foods. She will also increase vegetables and lean protein in her diet and continue to work on diet, exercise, and weight loss efforts. We will recheck labs in 3 months and Lunabelle agrees to follow up with our clinic in 3 weeks.  We  spent > than 50% of the 30 minute visit on the counseling as documented in the note.  Obesity Azariyah is currently in the action stage of change. As such, her goal is to continue with weight loss efforts She has agreed to follow the Category 2 plan + 100 calories Daphne has been instructed to work up to a goal of 150 minutes of combined cardio and strengthening exercise per week for weight loss and overall health benefits. We discussed the following Behavioral Modification Strategies today: increasing lean protein intake, decreasing simple carbohydrates  and work on meal planning and easy cooking plans   Iriel has agreed to follow up with our clinic in 3 weeks. She was informed of the importance of frequent follow up visits to maximize her success with intensive lifestyle modifications for her multiple health conditions.   OBESITY BEHAVIORAL INTERVENTION VISIT  Today's visit was # 2 out of 22.  Starting weight: 218 lbs Starting date: 10/09/17 Today's weight : 212 lbs Today's date: 10/24/2017 Total lbs lost to date: 6 (Patients must lose 7 lbs in the first 6 months to continue with counseling)   ASK: We discussed the diagnosis of obesity with Suzi Roots today and Matea agreed to give [place] permission to discuss obesity behavioral modification therapy today.  ASSESS: Meighan has the diagnosis of obesity and her BMI today is 31.29 Srishti is in the action stage of change   ADVISE: Haadiya was educated  on the multiple health risks of obesity as well as the benefit of weight loss to improve her health. She was advised of the need for long term treatment and the importance of lifestyle modifications.  AGREE: Multiple dietary modification options and treatment options were discussed and  Brightyn agreed to the above obesity treatment plan.  I, Burt Knack, am acting as transcriptionist for Quillian Quince, MD  I have reviewed the above documentation for accuracy and completeness, and I agree with the above. -Quillian Quince, MD

## 2017-11-07 ENCOUNTER — Ambulatory Visit (INDEPENDENT_AMBULATORY_CARE_PROVIDER_SITE_OTHER): Payer: 59 | Admitting: Family Medicine

## 2017-11-07 VITALS — BP 111/73 | HR 62 | Temp 98.0°F | Ht 69.0 in | Wt 213.0 lb

## 2017-11-07 DIAGNOSIS — E669 Obesity, unspecified: Secondary | ICD-10-CM

## 2017-11-07 DIAGNOSIS — Z6831 Body mass index (BMI) 31.0-31.9, adult: Secondary | ICD-10-CM | POA: Diagnosis not present

## 2017-11-07 DIAGNOSIS — E7849 Other hyperlipidemia: Secondary | ICD-10-CM | POA: Diagnosis not present

## 2017-11-07 NOTE — Progress Notes (Signed)
Office: [phone]  /  Fax: [phone]   HPI:   Chief Complaint: OBESITY Jamie Bean is here to discuss her progress with her obesity treatment plan. She is on the Category 2 plan + 100 calories and is following her eating plan approximately 60 % of the time. She states she is doing zumba for 45-60 minutes 3-4 times per week. Jamie Bean had increase in traveling for work and tried to stick to plan but found it difficult. May have gotten a bit comfortable with making exceptions.  Her weight is 213 lb (96.6 kg) today and has gained 1 pound since her last visit. She has lost 5 lbs since starting treatment with [place].  Hyperlipidemia Jamie Bean has hyperlipidemia and has been trying to improve her cholesterol levels with intensive lifestyle modification including a low saturated fat diet, exercise and weight loss. She is not on medications currently. She denies any chest pain, claudication or myalgias.  ALLERGIES: Allergies  Allergen Reactions  . Other Hives  . Sulfa Antibiotics Hives    MEDICATIONS: Current Outpatient Medications on File Prior to Visit  Medication Sig Dispense Refill  . Biotin 1 MG CAPS Take 1 mg by mouth daily.    . Cholecalciferol (VITAMIN D3) 5000 units TABS Take by mouth daily.    [name] co-enzyme Q-10 30 MG capsule Take 30 mg by mouth daily.    . ferrous sulfate 325 (65 FE) MG tablet Take 325 mg by mouth daily.    . Lactobacillus (PROBIOTIC ACIDOPHILUS PO) Take 1 capsule by mouth daily.    . Melatonin 1 MG CAPS Take 1 capsule by mouth at bedtime.    . Multiple Vitamin (MULTIVITAMIN) tablet Take by mouth.     No current facility-administered medications on file prior to visit.     PAST MEDICAL HISTORY: Past Medical History:  Diagnosis Date  . Anemia   . Anxiety   . Arthritis   . Constipation   . Joint pain   . Kidney problem   . Knee pain   . Palpitations   . UTI (urinary tract infection)   . Vitamin D deficiency     PAST SURGICAL HISTORY: Past Surgical  History:  Procedure Laterality Date  . INCISE AND DRAIN ABCESS     1994    SOCIAL HISTORY: Social History   Tobacco Use  . Smoking status: Never Smoker  . Smokeless tobacco: Never Used  Substance Use Topics  . Alcohol use: Yes    Comment: occasional  . Drug use: No    FAMILY HISTORY: Family History  Problem Relation Age of Onset  . Renal cancer Maternal Grandmother 70  . Alcohol abuse Maternal Grandmother   . Hyperlipidemia Maternal Grandmother   . Hypertension Maternal Grandmother   . Arthritis Father   . Obesity Father   . Alcohol abuse Maternal Uncle   . Mental illness Maternal Uncle   . Mental illness Paternal Aunt   . Diabetes Paternal Aunt   . Hyperlipidemia Maternal Grandfather   . Hypertension Maternal Grandfather   . Stroke Paternal Grandmother   . Diabetes Paternal Grandmother   . Cancer Paternal Grandfather        lung  . Colon cancer Neg Hx   . Breast cancer Neg Hx     ROS: Review of Systems  Constitutional: Negative for weight loss.  Cardiovascular: Negative for chest pain and claudication.  Musculoskeletal: Negative for myalgias.    PHYSICAL EXAM: Blood pressure 111/73, pulse 62, temperature 98 F (36.7 C), temperature source  Oral, height 5\' 9"  (1.753 m), weight 213 lb (96.6 kg), SpO2 98 %. Body mass index is 31.45 kg/m. Physical Exam  Constitutional: She is oriented to person, place, and time. She appears well-developed and well-nourished.  Cardiovascular: Normal rate.  Pulmonary/Chest: Effort normal.  Musculoskeletal: Normal range of motion.  Neurological: She is oriented to person, place, and time.  Skin: Skin is warm and dry.  Psychiatric: She has a normal mood and affect. Her behavior is normal.  Vitals reviewed.   RECENT LABS AND TESTS: BMET    Component Value Date/Time   NA 140 10/09/2017 0910   K 4.3 10/09/2017 0910   CL 102 10/09/2017 0910   CO2 24 10/09/2017 0910   GLUCOSE 84 10/09/2017 0910   GLUCOSE 90 08/31/2017 0929     BUN 13 10/09/2017 0910   CREATININE 0.82 10/09/2017 0910   CALCIUM 9.0 10/09/2017 0910   GFRNONAA 94 10/09/2017 0910   GFRAA 109 10/09/2017 0910   Lab Results  Component Value Date   HGBA1C 5.5 10/09/2017   HGBA1C 5.4 08/31/2017   HGBA1C 5.6 08/28/2016   Lab Results  Component Value Date   INSULIN 5.8 10/09/2017   CBC    Component Value Date/Time   WBC 5.7 10/09/2017 0910   WBC 5.2 08/31/2017 0929   RBC 4.43 10/09/2017 0910   RBC 4.17 08/31/2017 0929   HGB 11.8 10/09/2017 0910   HCT 36.0 10/09/2017 0910   PLT 198.0 08/31/2017 0929   MCV 81 10/09/2017 0910   MCH 26.6 10/09/2017 0910   MCHC 32.8 10/09/2017 0910   MCHC 33.2 08/31/2017 0929   RDW 13.9 10/09/2017 0910   LYMPHSABS 1.5 10/09/2017 0910   MONOABS 0.5 08/31/2017 0929   EOSABS 0.1 10/09/2017 0910   BASOSABS 0.0 10/09/2017 0910   Iron/TIBC/Ferritin/ %Sat No results found for: IRON, TIBC, FERRITIN, IRONPCTSAT Lipid Panel     Component Value Date/Time   CHOL 185 10/09/2017 0910   TRIG 57 10/09/2017 0910   HDL 59 10/09/2017 0910   CHOLHDL 3 08/31/2017 0929   VLDL 7.6 08/31/2017 0929   LDLCALC 115 (H) 10/09/2017 0910   Hepatic Function Panel     Component Value Date/Time   PROT 6.7 10/09/2017 0910   ALBUMIN 4.2 10/09/2017 0910   AST 16 10/09/2017 0910   ALT 10 10/09/2017 0910   ALKPHOS 37 (L) 10/09/2017 0910   BILITOT 0.3 10/09/2017 0910      Component Value Date/Time   TSH 0.89 08/31/2017 0929   TSH 0.85 08/28/2016 1402    ASSESSMENT AND PLAN: Other hyperlipidemia  Class 1 obesity with serious comorbidity and body mass index (BMI) of 31.0 to 31.9 in adult, unspecified obesity type  PLAN:  Hyperlipidemia Maeson was informed of the American Heart Association Guidelines emphasizing intensive lifestyle modifications as the first line treatment for hyperlipidemia. We discussed many lifestyle modifications today in depth, and Jamie Bean will continue to work on decreasing saturated fats such as  fatty red meat, butter and many fried foods. She will continue Category 2 plan and she will also increase vegetables and lean protein in her diet and continue to work on exercise and weight loss efforts. Jamie Bean agrees to follow up with our clinic in 2 weeks.  We spent > than 50% of the 15 minute visit on the counseling as documented in the note.  Obesity Jamie Bean is currently in the action stage of change. As such, her goal is to continue with weight loss efforts She has agreed to follow  the Category 2 plan Jamie Bean has been instructed to work up to a goal of 150 minutes of combined cardio and strengthening exercise per week for weight loss and overall health benefits. We discussed the following Behavioral Modification Strategies today: increasing lean protein intake, increasing vegetables, work on meal planning and easy cooking plans, travel eating strategies, and planning for success   Jamie Bean has agreed to follow up with our clinic in 2 weeks. She was informed of the importance of frequent follow up visits to maximize her success with intensive lifestyle modifications for her multiple health conditions.   OBESITY BEHAVIORAL INTERVENTION VISIT  Today's visit was # 3 out of 22.  Starting weight: 218 lbs Starting date: 10/09/17 Today's weight : 213 lbs Today's date: 11/07/2017 Total lbs lost to date: 5 (Patients must lose 7 lbs in the first 6 months to continue with counseling)   ASK: We discussed the diagnosis of obesity with Jamie Bean today and Jamie Bean agreed to give [place] permission to discuss obesity behavioral modification therapy today.  ASSESS: Jamie Bean has the diagnosis of obesity and her BMI today is 31.44 Jamie Bean is in the action stage of change   ADVISE: Jamie Bean was educated on the multiple health risks of obesity as well as the benefit of weight loss to improve her health. She was advised of the need for long term treatment and the importance of lifestyle  modifications.  AGREE: Multiple dietary modification options and treatment options were discussed and  Jamie Bean agreed to the above obesity treatment plan.  I, Burt Knack, am acting as transcriptionist for Debbra Riding, MD  I have reviewed the above documentation for accuracy and completeness, and I agree with the above. - Debbra Riding, MD

## 2017-11-28 ENCOUNTER — Ambulatory Visit (INDEPENDENT_AMBULATORY_CARE_PROVIDER_SITE_OTHER): Payer: 59 | Admitting: Family Medicine

## 2017-11-28 VITALS — BP 120/76 | HR 58 | Temp 97.5°F | Ht 69.0 in | Wt 206.0 lb

## 2017-11-28 DIAGNOSIS — E669 Obesity, unspecified: Secondary | ICD-10-CM | POA: Diagnosis not present

## 2017-11-28 DIAGNOSIS — G8929 Other chronic pain: Secondary | ICD-10-CM

## 2017-11-28 DIAGNOSIS — Z9189 Other specified personal risk factors, not elsewhere classified: Secondary | ICD-10-CM | POA: Diagnosis not present

## 2017-11-28 DIAGNOSIS — E559 Vitamin D deficiency, unspecified: Secondary | ICD-10-CM | POA: Diagnosis not present

## 2017-11-28 DIAGNOSIS — M25562 Pain in left knee: Secondary | ICD-10-CM

## 2017-11-28 DIAGNOSIS — M25561 Pain in right knee: Secondary | ICD-10-CM

## 2017-11-28 DIAGNOSIS — Z6830 Body mass index (BMI) 30.0-30.9, adult: Secondary | ICD-10-CM

## 2017-11-29 NOTE — Progress Notes (Signed)
Office: [phone]  /  Fax: [phone]   HPI:   Chief Complaint: OBESITY Jamie Bean is here to discuss her progress with her obesity treatment plan. She is on the Category 2 plan and is following her eating plan approximately 75 % of the time. She states she is doing Zumba and weights 45 minutes 4 times per week. Jamie Bean is doing well with breakfast and lunch, and occasionally she would rather go to the gym, than make dinner. She has been eating [employer]. Her weight is 206 lb (93.4 kg) today and has had a weight loss of 7 pounds over a period of 3 weeks since her last visit. She has lost 12 lbs since starting treatment with us.  Vitamin D deficiency Jamie Bean has a diagnosis of vitamin D deficiency. She is currently taking OTC vit D. Fatigue is improving and she denies nausea, vomiting or muscle weakness.  At risk for osteopenia and osteoporosis Jamie Bean is at higher risk of osteopenia and osteoporosis due to vitamin D deficiency.   Knee Pain, bilateral Jamie Bean reports knee pain. This sounds like osteoarthritis.  ALLERGIES: Allergies  Allergen Reactions  . Other Hives  . Sulfa Antibiotics Hives    MEDICATIONS: Current Outpatient Medications on File Prior to Visit  Medication Sig Dispense Refill  . Biotin 1 MG CAPS Take 1 mg by mouth daily.    . Cholecalciferol (VITAMIN D3) 5000 units TABS Take by mouth daily.    [name]. co-enzyme Q-10 30 MG capsule Take 30 mg by mouth daily.    . ferrous sulfate 325 (65 FE) MG tablet Take 325 mg by mouth daily.    . Lactobacillus (PROBIOTIC ACIDOPHILUS PO) Take 1 capsule by mouth daily.    . Melatonin 1 MG CAPS Take 1 capsule by mouth at bedtime.    . Multiple Vitamin (MULTIVITAMIN) tablet Take by mouth.     No current facility-administered medications on file prior to visit.     PAST MEDICAL HISTORY: Past Medical History:  Diagnosis Date  . Anemia   . Anxiety   . Arthritis   . Constipation   . Joint pain   . Kidney problem   . Knee  pain   . Palpitations   . UTI (urinary tract infection)   . Vitamin D deficiency     PAST SURGICAL HISTORY: Past Surgical History:  Procedure Laterality Date  . INCISE AND DRAIN ABCESS     1994    SOCIAL HISTORY: Social History   Tobacco Use  . Smoking status: Never Smoker  . Smokeless tobacco: Never Used  Substance Use Topics  . Alcohol use: Yes    Comment: occasional  . Drug use: No    FAMILY HISTORY: Family History  Problem Relation Age of Onset  . Renal cancer Maternal Grandmother 70  . Alcohol abuse Maternal Grandmother   . Hyperlipidemia Maternal Grandmother   . Hypertension Maternal Grandmother   . Arthritis Father   . Obesity Father   . Alcohol abuse Maternal Uncle   . Mental illness Maternal Uncle   . Mental illness Paternal Aunt   . Diabetes Paternal Aunt   . Hyperlipidemia Maternal Grandfather   . Hypertension Maternal Grandfather   . Stroke Paternal Grandmother   . Diabetes Paternal Grandmother   . Cancer Paternal Grandfather        lung  . Colon cancer Neg Hx   . Breast cancer Neg Hx     ROS: Review of Systems  Constitutional: Positive for malaise/fatigue and weight loss.  Gastrointestinal: Negative for nausea and vomiting.  Musculoskeletal:       Negative for muscle weakness Positive for knee pain    PHYSICAL EXAM: Blood pressure 120/76, pulse (!) 58, temperature (!) 97.5 F (36.4 C), temperature source Oral, height 5\' 9"  (1.753 m), weight 206 lb (93.4 kg), SpO2 99 %. Body mass index is 30.42 kg/m. Physical Exam  Constitutional: She is oriented to person, place, and time. She appears well-developed and well-nourished.  Cardiovascular: Normal rate.  Pulmonary/Chest: Effort normal.  Musculoskeletal: Normal range of motion.  Neurological: She is oriented to person, place, and time.  Skin: Skin is warm and dry.  Psychiatric: She has a normal mood and affect. Her behavior is normal.  Vitals reviewed.   RECENT LABS AND TESTS: BMET      Component Value Date/Time   NA 140 10/09/2017 0910   K 4.3 10/09/2017 0910   CL 102 10/09/2017 0910   CO2 24 10/09/2017 0910   GLUCOSE 84 10/09/2017 0910   GLUCOSE 90 08/31/2017 0929   BUN 13 10/09/2017 0910   CREATININE 0.82 10/09/2017 0910   CALCIUM 9.0 10/09/2017 0910   GFRNONAA 94 10/09/2017 0910   GFRAA 109 10/09/2017 0910   Lab Results  Component Value Date   HGBA1C 5.5 10/09/2017   HGBA1C 5.4 08/31/2017   HGBA1C 5.6 08/28/2016   Lab Results  Component Value Date   INSULIN 5.8 10/09/2017   CBC    Component Value Date/Time   WBC 5.7 10/09/2017 0910   WBC 5.2 08/31/2017 0929   RBC 4.43 10/09/2017 0910   RBC 4.17 08/31/2017 0929   HGB 11.8 10/09/2017 0910   HCT 36.0 10/09/2017 0910   PLT 198.0 08/31/2017 0929   MCV 81 10/09/2017 0910   MCH 26.6 10/09/2017 0910   MCHC 32.8 10/09/2017 0910   MCHC 33.2 08/31/2017 0929   RDW 13.9 10/09/2017 0910   LYMPHSABS 1.5 10/09/2017 0910   MONOABS 0.5 08/31/2017 0929   EOSABS 0.1 10/09/2017 0910   BASOSABS 0.0 10/09/2017 0910   Iron/TIBC/Ferritin/ %Sat No results found for: IRON, TIBC, FERRITIN, IRONPCTSAT Lipid Panel     Component Value Date/Time   CHOL 185 10/09/2017 0910   TRIG 57 10/09/2017 0910   HDL 59 10/09/2017 0910   CHOLHDL 3 08/31/2017 0929   VLDL 7.6 08/31/2017 0929   LDLCALC 115 (H) 10/09/2017 0910   Hepatic Function Panel     Component Value Date/Time   PROT 6.7 10/09/2017 0910   ALBUMIN 4.2 10/09/2017 0910   AST 16 10/09/2017 0910   ALT 10 10/09/2017 0910   ALKPHOS 37 (L) 10/09/2017 0910   BILITOT 0.3 10/09/2017 0910      Component Value Date/Time   TSH 0.89 08/31/2017 0929   TSH 0.85 08/28/2016 1402   Results for THRESSA, SHIFFER (MRN [id number]) as of 11/29/2017 09:28  Ref. Range 10/09/2017 09:10  Vitamin D, 25-Hydroxy Latest Ref Range: 30.0 - 100.0 ng/mL 77.6   ASSESSMENT AND PLAN: Vitamin D deficiency  Chronic pain of both knees  At risk for osteoporosis  Class 1 obesity with  serious comorbidity and body mass index (BMI) of 30.0 to 30.9 in adult, unspecified obesity type  PLAN:  Vitamin D Deficiency Errin was informed that low vitamin D levels contributes to fatigue and are associated with obesity, breast, and colon cancer. She agrees to continue to take OTC Vit D and will follow up for routine testing of vitamin D, at least 2-3 times per year. She was informed of the  risk of over-replacement of vitamin D and agrees to not increase her dose unless she discusses this with [place] first.  At risk for osteopenia and osteoporosis October is at risk for osteopenia and osteoporosis due to her vitamin D deficiency. She was encouraged to take her vitamin D and follow her higher calcium diet and increase strengthening exercise to help strengthen her bones and decrease her risk of osteopenia and osteoporosis.  Knee Pain, bilateral Deshon can take 500 to 650 mg Tylenol q 6 to 8 hours as needed for pain. Avayah will follow up with our clinic in 2 weeks.  Obesity Tailey is currently in the action stage of change. As such, her goal is to continue with weight loss efforts She has agreed to keep a food journal with 400 to 500 calories and 30+ grams of protein at supper daily and follow the Category 2 plan Nicolas has been instructed to work up to a goal of 150 minutes of combined cardio and strengthening exercise per week for weight loss and overall health benefits. We discussed the following Behavioral Modification Strategies today: planning for success, better snacking choices, increasing lean protein intake, increasing vegetables and work on meal planning and easy cooking plans  Jo-Anne has agreed to follow up with our clinic in 2 weeks. She was informed of the importance of frequent follow up visits to maximize her success with intensive lifestyle modifications for her multiple health conditions.   OBESITY BEHAVIORAL INTERVENTION VISIT  Today's visit was # 4 out of  22.  Starting weight: 218 lbs Starting date: 10/09/17 Today's weight : 206 lbs Today's date: 11/28/2017 Total lbs lost to date: 12    ASK: We discussed the diagnosis of obesity with Suzi Roots today and Aislee agreed to give [place] permission to discuss obesity behavioral modification therapy today.  ASSESS: Aleksa has the diagnosis of obesity and her BMI today is 30.41 Nereida is in the action stage of change   ADVISE: Nichola was educated on the multiple health risks of obesity as well as the benefit of weight loss to improve her health. She was advised of the need for long term treatment and the importance of lifestyle modifications.  AGREE: Multiple dietary modification options and treatment options were discussed and  Devika agreed to the above obesity treatment plan.  I, [place] Crane, am acting as transcriptionist for Filbert Schilder, MD  I have reviewed the above documentation for accuracy and completeness, and I agree with the above. - Debbra Riding, MD

## 2017-12-12 ENCOUNTER — Encounter (INDEPENDENT_AMBULATORY_CARE_PROVIDER_SITE_OTHER): Payer: Self-pay

## 2017-12-12 ENCOUNTER — Ambulatory Visit (INDEPENDENT_AMBULATORY_CARE_PROVIDER_SITE_OTHER): Payer: Self-pay | Admitting: Physician Assistant

## 2017-12-26 ENCOUNTER — Ambulatory Visit (INDEPENDENT_AMBULATORY_CARE_PROVIDER_SITE_OTHER): Payer: 59 | Admitting: Physician Assistant

## 2017-12-26 VITALS — BP 119/60 | HR 62 | Temp 97.7°F | Ht 69.0 in | Wt 204.0 lb

## 2017-12-26 DIAGNOSIS — Z6830 Body mass index (BMI) 30.0-30.9, adult: Secondary | ICD-10-CM

## 2017-12-26 DIAGNOSIS — E559 Vitamin D deficiency, unspecified: Secondary | ICD-10-CM

## 2017-12-26 DIAGNOSIS — E669 Obesity, unspecified: Secondary | ICD-10-CM

## 2017-12-26 NOTE — Progress Notes (Signed)
Office: [phone]  /  Fax: [phone]   HPI:   Chief Complaint: OBESITY Jamie Bean is here to discuss her progress with her obesity treatment plan. She is on the keep a food journal with 400 to 500 calories and 30+ grams of protein at supper daily and the Category 2 plan and is following her eating plan approximately 75 % of the time. She states she is doing Zumba 45 to 60 minutes 3 to 4 times per week. Latrenda did well with weight loss. She reports struggling with meal planning, especially at dinner. She would like more ideas for meals. Her weight is 204 lb (92.5 kg) today and has had a weight loss of 2 pounds over a period of 4 weeks since her last visit. She has lost 14 lbs since starting treatment with us.  Vitamin D deficiency Katilynn has a diagnosis of vitamin D deficiency. She is currently taking OTC vit D and denies nausea, vomiting or muscle weakness.  ALLERGIES: Allergies  Allergen Reactions  . Other Hives  . Sulfa Antibiotics Hives    MEDICATIONS: Current Outpatient Medications on File Prior to Visit  Medication Sig Dispense Refill  . Biotin 1 MG CAPS Take 1 mg by mouth daily.    . Cholecalciferol (VITAMIN D3) 5000 units TABS Take by mouth daily.    [name]. co-enzyme Q-10 30 MG capsule Take 30 mg by mouth daily.    . ferrous sulfate 325 (65 FE) MG tablet Take 325 mg by mouth daily.    . Lactobacillus (PROBIOTIC ACIDOPHILUS PO) Take 1 capsule by mouth daily.    . Melatonin 1 MG CAPS Take 1 capsule by mouth at bedtime.    . Multiple Vitamin (MULTIVITAMIN) tablet Take by mouth.     No current facility-administered medications on file prior to visit.     PAST MEDICAL HISTORY: Past Medical History:  Diagnosis Date  . Anemia   . Anxiety   . Arthritis   . Constipation   . Joint pain   . Kidney problem   . Knee pain   . Palpitations   . UTI (urinary tract infection)   . Vitamin D deficiency     PAST SURGICAL HISTORY: Past Surgical History:  Procedure  Laterality Date  . INCISE AND DRAIN ABCESS     1994    SOCIAL HISTORY: Social History   Tobacco Use  . Smoking status: Never Smoker  . Smokeless tobacco: Never Used  Substance Use Topics  . Alcohol use: Yes    Comment: occasional  . Drug use: No    FAMILY HISTORY: Family History  Problem Relation Age of Onset  . Renal cancer Maternal Grandmother 70  . Alcohol abuse Maternal Grandmother   . Hyperlipidemia Maternal Grandmother   . Hypertension Maternal Grandmother   . Arthritis Father   . Obesity Father   . Alcohol abuse Maternal Uncle   . Mental illness Maternal Uncle   . Mental illness Paternal Aunt   . Diabetes Paternal Aunt   . Hyperlipidemia Maternal Grandfather   . Hypertension Maternal Grandfather   . Stroke Paternal Grandmother   . Diabetes Paternal Grandmother   . Cancer Paternal Grandfather        lung  . Colon cancer Neg Hx   . Breast cancer Neg Hx     ROS: Review of Systems  Constitutional: Positive for weight loss.  Gastrointestinal: Negative for nausea and vomiting.  Musculoskeletal:       Negative for muscle weakness  PHYSICAL EXAM: Blood pressure 119/60, pulse 62, temperature 97.7 F (36.5 C), temperature source Oral, height 5\' 9"  (1.753 m), weight 204 lb (92.5 kg), last menstrual period 12/20/2017, SpO2 100 %. Body mass index is 30.13 kg/m. Physical Exam  Constitutional: She is oriented to person, place, and time. She appears well-developed and well-nourished.  Cardiovascular: Normal rate.  Pulmonary/Chest: Effort normal.  Musculoskeletal: Normal range of motion.  Neurological: She is oriented to person, place, and time.  Skin: Skin is warm and dry.  Psychiatric: She has a normal mood and affect.  Vitals reviewed.   RECENT LABS AND TESTS: BMET    Component Value Date/Time   NA 140 10/09/2017 0910   K 4.3 10/09/2017 0910   CL 102 10/09/2017 0910   CO2 24 10/09/2017 0910   GLUCOSE 84 10/09/2017 0910   GLUCOSE 90 08/31/2017 0929     BUN 13 10/09/2017 0910   CREATININE 0.82 10/09/2017 0910   CALCIUM 9.0 10/09/2017 0910   GFRNONAA 94 10/09/2017 0910   GFRAA 109 10/09/2017 0910   Lab Results  Component Value Date   HGBA1C 5.5 10/09/2017   HGBA1C 5.4 08/31/2017   HGBA1C 5.6 08/28/2016   Lab Results  Component Value Date   INSULIN 5.8 10/09/2017   CBC    Component Value Date/Time   WBC 5.7 10/09/2017 0910   WBC 5.2 08/31/2017 0929   RBC 4.43 10/09/2017 0910   RBC 4.17 08/31/2017 0929   HGB 11.8 10/09/2017 0910   HCT 36.0 10/09/2017 0910   PLT 198.0 08/31/2017 0929   MCV 81 10/09/2017 0910   MCH 26.6 10/09/2017 0910   MCHC 32.8 10/09/2017 0910   MCHC 33.2 08/31/2017 0929   RDW 13.9 10/09/2017 0910   LYMPHSABS 1.5 10/09/2017 0910   MONOABS 0.5 08/31/2017 0929   EOSABS 0.1 10/09/2017 0910   BASOSABS 0.0 10/09/2017 0910   Iron/TIBC/Ferritin/ %Sat No results found for: IRON, TIBC, FERRITIN, IRONPCTSAT Lipid Panel     Component Value Date/Time   CHOL 185 10/09/2017 0910   TRIG 57 10/09/2017 0910   HDL 59 10/09/2017 0910   CHOLHDL 3 08/31/2017 0929   VLDL 7.6 08/31/2017 0929   LDLCALC 115 (H) 10/09/2017 0910   Hepatic Function Panel     Component Value Date/Time   PROT 6.7 10/09/2017 0910   ALBUMIN 4.2 10/09/2017 0910   AST 16 10/09/2017 0910   ALT 10 10/09/2017 0910   ALKPHOS 37 (L) 10/09/2017 0910   BILITOT 0.3 10/09/2017 0910      Component Value Date/Time   TSH 0.89 08/31/2017 0929   TSH 0.85 08/28/2016 1402   Results for MYLIYAH, REBUCK (MRN [id number]) as of 12/26/2017 12:14  Ref. Range 10/09/2017 09:10  Vitamin D, 25-Hydroxy Latest Ref Range: 30.0 - 100.0 ng/mL 77.6   ASSESSMENT AND PLAN: Vitamin D deficiency  Class 1 obesity with serious comorbidity and body mass index (BMI) of 30.0 to 30.9 in adult, unspecified obesity type  PLAN:  Vitamin D Deficiency Asa was informed that low vitamin D levels contributes to fatigue and are associated with obesity, breast, and colon  cancer. She agrees to continue to take OTC Vit D @5 ,000 IU daily and will follow up for routine testing of vitamin D, at least 2-3 times per year. She was informed of the risk of over-replacement of vitamin D and agrees to not increase her dose unless she discusses this with [place] first.  We spent > than 50% of the 15 minute visit on the counseling as  documented in the note.  Obesity Lexianna is currently in the action stage of change. As such, her goal is to continue with weight loss efforts She has agreed to keep a food journal with 400 to 500 calories and 30+ grams of protein at supper daily and follow the Category 2 plan. Recipes were given today. Jamie Bean has been instructed to work up to a goal of 150 minutes of combined cardio and strengthening exercise per week for weight loss and overall health benefits. We discussed the following Behavioral Modification Strategies today: decrease eating out and work on meal planning and easy cooking plans  Denni has agreed to follow up with our clinic in 2 to 3 weeks. She was informed of the importance of frequent follow up visits to maximize her success with intensive lifestyle modifications for her multiple health conditions.   OBESITY BEHAVIORAL INTERVENTION VISIT  Today's visit was # 5   Starting weight: 218 lbs Starting date: 10/09/17 Today's weight : 204 lbs Today's date: 12/26/2017 Total lbs lost to date: 14 At least 15 minutes were spent on discussing the following behavioral intervention visit.   ASK: We discussed the diagnosis of obesity with Suzi RootsShiquita Siefert today and Fermina agreed to give us permission to discuss obesity behavioral modification therapy today.  ASSESS: Jamie Bean has the diagnosis of obesity and her BMI today is 30.11 Aleyza is in the action stage of change   ADVISE: Jamie Bean was educated on the multiple health risks of obesity as well as the benefit of weight loss to improve her health. She was advised of the need  for long term treatment and the importance of lifestyle modifications to improve her current health and to decrease her risk of future health problems.  AGREE: Multiple dietary modification options and treatment options were discussed and  Porsche agreed to follow the recommendations documented in the above note.  ARRANGE: Jamie Bean was educated on the importance of frequent visits to treat obesity as outlined per CMS and USPSTF guidelines and agreed to schedule her next follow up appointment today.  Cristi LoronI, Joanne Murray, am acting as transcriptionist for Alois Clicheracey Rylen Swindler, PA-C I, Alois Clicheracey Feliciana Narayan, PA-C have reviewed above note and agree with its content

## 2018-01-10 ENCOUNTER — Encounter (INDEPENDENT_AMBULATORY_CARE_PROVIDER_SITE_OTHER): Payer: Self-pay | Admitting: Physician Assistant

## 2018-01-10 ENCOUNTER — Ambulatory Visit (INDEPENDENT_AMBULATORY_CARE_PROVIDER_SITE_OTHER): Payer: 59 | Admitting: Physician Assistant

## 2018-01-10 VITALS — BP 114/70 | HR 62 | Temp 98.7°F | Ht 69.0 in | Wt 206.0 lb

## 2018-01-10 DIAGNOSIS — E669 Obesity, unspecified: Secondary | ICD-10-CM | POA: Diagnosis not present

## 2018-01-10 DIAGNOSIS — Z6830 Body mass index (BMI) 30.0-30.9, adult: Secondary | ICD-10-CM | POA: Diagnosis not present

## 2018-01-10 DIAGNOSIS — E559 Vitamin D deficiency, unspecified: Secondary | ICD-10-CM

## 2018-01-10 NOTE — Progress Notes (Signed)
Office: [phone]  /  Fax: [phone]   HPI:   Chief Complaint: OBESITY Jamie Bean is here to discuss her progress with her obesity treatment plan. She is on the keep a food journal with 400-500 calories and 30+ grams of protein at supper daily and follow the Category 2 plan and is following her eating plan approximately 70 % of the time. She states she is doing zumba for 45 minutes 3 times per week. Elisabeth reports that she struggled with meal planning especially with dinner. She also states that she did a lot of indulging over the holiday. She is ready to get back on track.  Her weight is 206 lb (93.4 kg) today and has gained 2 pounds since her last visit. She has lost 12 lbs since starting treatment with [place].  Vitamin D Deficiency Jamie Bean has a diagnosis of vitamin D deficiency. She is on OTC Vit D and denies nausea, vomiting or muscle weakness.  ALLERGIES: Allergies  Allergen Reactions  . Other Hives  . Sulfa Antibiotics Hives    MEDICATIONS: Current Outpatient Medications on File Prior to Visit  Medication Sig Dispense Refill  . Biotin 1 MG CAPS Take 1 mg by mouth daily.    . Cholecalciferol (VITAMIN D3) 5000 units TABS Take by mouth daily.    [name] co-enzyme Q-10 30 MG capsule Take 30 mg by mouth daily.    . ferrous sulfate 325 (65 FE) MG tablet Take 325 mg by mouth daily.    . Lactobacillus (PROBIOTIC ACIDOPHILUS PO) Take 1 capsule by mouth daily.    . Melatonin 1 MG CAPS Take 1 capsule by mouth at bedtime.    . Multiple Vitamin (MULTIVITAMIN) tablet Take by mouth.     No current facility-administered medications on file prior to visit.     PAST MEDICAL HISTORY: Past Medical History:  Diagnosis Date  . Anemia   . Anxiety   . Arthritis   . Constipation   . Joint pain   . Kidney problem   . Knee pain   . Palpitations   . UTI (urinary tract infection)   . Vitamin D deficiency     PAST SURGICAL HISTORY: Past Surgical History:  Procedure Laterality Date  .  INCISE AND DRAIN ABCESS     1994    SOCIAL HISTORY: Social History   Tobacco Use  . Smoking status: Never Smoker  . Smokeless tobacco: Never Used  Substance Use Topics  . Alcohol use: Yes    Comment: occasional  . Drug use: No    FAMILY HISTORY: Family History  Problem Relation Age of Onset  . Renal cancer Maternal Grandmother 70  . Alcohol abuse Maternal Grandmother   . Hyperlipidemia Maternal Grandmother   . Hypertension Maternal Grandmother   . Arthritis Father   . Obesity Father   . Alcohol abuse Maternal Uncle   . Mental illness Maternal Uncle   . Mental illness Paternal Aunt   . Diabetes Paternal Aunt   . Hyperlipidemia Maternal Grandfather   . Hypertension Maternal Grandfather   . Stroke Paternal Grandmother   . Diabetes Paternal Grandmother   . Cancer Paternal Grandfather        lung  . Colon cancer Neg Hx   . Breast cancer Neg Hx     ROS: Review of Systems  Constitutional: Negative for weight loss.  Gastrointestinal: Negative for nausea and vomiting.  Musculoskeletal:       Negative muscle weakness    PHYSICAL EXAM: Blood pressure 114/70,  pulse 62, temperature 98.7 F (37.1 C), temperature source Oral, height 5\' 9"  (1.753 m), weight 206 lb (93.4 kg), last menstrual period 12/20/2017, SpO2 99 %. Body mass index is 30.42 kg/m. Physical Exam  Constitutional: She is oriented to person, place, and time. She appears well-developed and well-nourished.  Cardiovascular: Normal rate.  Pulmonary/Chest: Effort normal.  Musculoskeletal: Normal range of motion.  Neurological: She is oriented to person, place, and time.  Skin: Skin is warm and dry.  Psychiatric: She has a normal mood and affect. Her behavior is normal.  Vitals reviewed.   RECENT LABS AND TESTS: BMET    Component Value Date/Time   NA 140 10/09/2017 0910   K 4.3 10/09/2017 0910   CL 102 10/09/2017 0910   CO2 24 10/09/2017 0910   GLUCOSE 84 10/09/2017 0910   GLUCOSE 90 08/31/2017 0929    BUN 13 10/09/2017 0910   CREATININE 0.82 10/09/2017 0910   CALCIUM 9.0 10/09/2017 0910   GFRNONAA 94 10/09/2017 0910   GFRAA 109 10/09/2017 0910   Lab Results  Component Value Date   HGBA1C 5.5 10/09/2017   HGBA1C 5.4 08/31/2017   HGBA1C 5.6 08/28/2016   Lab Results  Component Value Date   INSULIN 5.8 10/09/2017   CBC    Component Value Date/Time   WBC 5.7 10/09/2017 0910   WBC 5.2 08/31/2017 0929   RBC 4.43 10/09/2017 0910   RBC 4.17 08/31/2017 0929   HGB 11.8 10/09/2017 0910   HCT 36.0 10/09/2017 0910   PLT 198.0 08/31/2017 0929   MCV 81 10/09/2017 0910   MCH 26.6 10/09/2017 0910   MCHC 32.8 10/09/2017 0910   MCHC 33.2 08/31/2017 0929   RDW 13.9 10/09/2017 0910   LYMPHSABS 1.5 10/09/2017 0910   MONOABS 0.5 08/31/2017 0929   EOSABS 0.1 10/09/2017 0910   BASOSABS 0.0 10/09/2017 0910   Iron/TIBC/Ferritin/ %Sat No results found for: IRON, TIBC, FERRITIN, IRONPCTSAT Lipid Panel     Component Value Date/Time   CHOL 185 10/09/2017 0910   TRIG 57 10/09/2017 0910   HDL 59 10/09/2017 0910   CHOLHDL 3 08/31/2017 0929   VLDL 7.6 08/31/2017 0929   LDLCALC 115 (H) 10/09/2017 0910   Hepatic Function Panel     Component Value Date/Time   PROT 6.7 10/09/2017 0910   ALBUMIN 4.2 10/09/2017 0910   AST 16 10/09/2017 0910   ALT 10 10/09/2017 0910   ALKPHOS 37 (L) 10/09/2017 0910   BILITOT 0.3 10/09/2017 0910      Component Value Date/Time   TSH 0.89 08/31/2017 0929   TSH 0.85 08/28/2016 1402  Results for Jamie, Bean (MRN [id number]) as of 01/10/2018 17:14  Ref. Range 10/09/2017 09:10  Vitamin D, 25-Hydroxy Latest Ref Range: 30.0 - 100.0 ng/mL 77.6    ASSESSMENT AND PLAN: Vitamin D deficiency  Class 1 obesity with serious comorbidity and body mass index (BMI) of 30.0 to 30.9 in adult, unspecified obesity type  PLAN:  Vitamin D Deficiency Asjia was informed that low vitamin D levels contributes to fatigue and are associated with obesity, breast, and colon  cancer. Makailyn agrees to continue taking OTC Vit D and will follow up for routine testing of vitamin D, at least 2-3 times per year. She was informed of the risk of over-replacement of vitamin D and agrees to not increase her dose unless she discusses this with [place] first. Betta agrees to follow up with our clinic in 2 to 3 weeks and we will recheck labs at that time.  I spent > than 50% of the 15 minute visit on counseling as documented in the note.  Obesity Chelbie is currently in the action stage of change. As such, her goal is to continue with weight loss efforts She has agreed to change to keep a food journal with 1200 calories and 90+ grams of protein daily Tyna has been instructed to work up to a goal of 150 minutes of combined cardio and strengthening exercise per week for weight loss and overall health benefits. We discussed the following Behavioral Modification Strategies today: increasing lean protein intake, work on meal planning and easy cooking plans, and no skipping meals   Raylynne has agreed to follow up with our clinic in 2 to 3 weeks. She was informed of the importance of frequent follow up visits to maximize her success with intensive lifestyle modifications for her multiple health conditions.   OBESITY BEHAVIORAL INTERVENTION VISIT  Today's visit was # 6 out of 22.  Starting weight: 218 lbs Starting date: 10/09/17 Today's weight : 206 lbs Today's date: 01/10/2018 Total lbs lost to date: 12    ASK: We discussed the diagnosis of obesity with Suzi Roots today and Genevia agreed to give [place] permission to discuss obesity behavioral modification therapy today.  ASSESS: Kian has the diagnosis of obesity and her BMI today is 30.41 Annaclaire is in the action stage of change   ADVISE: Chloeanne was educated on the multiple health risks of obesity as well as the benefit of weight loss to improve her health. She was advised of the need for long term treatment and  the importance of lifestyle modifications.  AGREE: Multiple dietary modification options and treatment options were discussed and  Saralynn agreed to the above obesity treatment plan.  Trude Mcburney, am acting as transcriptionist for Alois Cliche, PA-C I, Alois Cliche, PA-C have reviewed above note and agree with its content

## 2018-01-30 ENCOUNTER — Ambulatory Visit (INDEPENDENT_AMBULATORY_CARE_PROVIDER_SITE_OTHER): Payer: Self-pay | Admitting: Physician Assistant

## 2018-02-04 ENCOUNTER — Ambulatory Visit (INDEPENDENT_AMBULATORY_CARE_PROVIDER_SITE_OTHER): Payer: 59 | Admitting: Physician Assistant

## 2018-02-04 ENCOUNTER — Encounter (INDEPENDENT_AMBULATORY_CARE_PROVIDER_SITE_OTHER): Payer: Self-pay | Admitting: Physician Assistant

## 2018-02-04 VITALS — BP 113/70 | HR 56 | Temp 98.5°F | Ht 69.0 in | Wt 207.0 lb

## 2018-02-04 DIAGNOSIS — Z9189 Other specified personal risk factors, not elsewhere classified: Secondary | ICD-10-CM | POA: Diagnosis not present

## 2018-02-04 DIAGNOSIS — E7849 Other hyperlipidemia: Secondary | ICD-10-CM

## 2018-02-04 DIAGNOSIS — E559 Vitamin D deficiency, unspecified: Secondary | ICD-10-CM | POA: Diagnosis not present

## 2018-02-04 DIAGNOSIS — E669 Obesity, unspecified: Secondary | ICD-10-CM

## 2018-02-04 DIAGNOSIS — Z6830 Body mass index (BMI) 30.0-30.9, adult: Secondary | ICD-10-CM

## 2018-02-04 DIAGNOSIS — E8881 Metabolic syndrome: Secondary | ICD-10-CM | POA: Diagnosis not present

## 2018-02-04 NOTE — Progress Notes (Signed)
Office: [phone]  /  Fax: [phone]   HPI:   Chief Complaint: OBESITY Gwenneth is here to discuss her progress with her obesity treatment plan. She is on the  keep a food journal with 1200 calories and 90g protein  and is following her eating plan approximately 65 % of the time. She states she is exercising by doing Zumba for 45-60 minutes 3-4 times per week. Shatyra reports eating out more recently. She is motivated to get back to cooking at home. She also notes that she has not worked out as much this past week.  Her weight is 207 lb (93.9 kg) today and has not lost weight since her last visit. She has lost 11 lbs since starting treatment with [place].  Hyperlipidemia Tambria has hyperlipidemia and has been trying to improve her cholesterol levels with intensive lifestyle modification including a low saturated fat diet, exercise and weight loss. She denies any chest pain, claudication or myalgias.  Vitamin D deficiency Janeka has a diagnosis of vitamin D deficiency. She is currently taking OTC vit D and denies nausea, vomiting or muscle weakness.   Ref. Range 10/09/2017 09:10  Vitamin D, 25-Hydroxy Latest Ref Range: 30.0 - 100.0 ng/mL 77.6   Insulin Resistance Anaiyah has a diagnosis of insulin resistance based on her elevated fasting insulin level >5. Although Fatima's blood glucose readings are still under good control, insulin resistance puts her at greater risk of metabolic syndrome and diabetes. She is not taking metformin currently and continues to work on diet and exercise to decrease risk of diabetes. She denies polyphagia.   At risk for osteopenia and osteoporosis Ronique is at higher risk of osteopenia and osteoporosis due to vitamin D deficiency.   ALLERGIES: Allergies  Allergen Reactions  . Other Hives  . Sulfa Antibiotics Hives    MEDICATIONS: Current Outpatient Medications on File Prior to Visit  Medication Sig Dispense Refill  . Biotin 1 MG CAPS Take 1  mg by mouth daily.    . Cholecalciferol (VITAMIN D3) 5000 units TABS Take by mouth daily.    [name] co-enzyme Q-10 30 MG capsule Take 30 mg by mouth daily.    . ferrous sulfate 325 (65 FE) MG tablet Take 325 mg by mouth daily.    . Lactobacillus (PROBIOTIC ACIDOPHILUS PO) Take 1 capsule by mouth daily.    . Melatonin 1 MG CAPS Take 1 capsule by mouth at bedtime.    . Multiple Vitamin (MULTIVITAMIN) tablet Take by mouth.     No current facility-administered medications on file prior to visit.     PAST MEDICAL HISTORY: Past Medical History:  Diagnosis Date  . Anemia   . Anxiety   . Arthritis   . Constipation   . Joint pain   . Kidney problem   . Knee pain   . Palpitations   . UTI (urinary tract infection)   . Vitamin D deficiency     PAST SURGICAL HISTORY: Past Surgical History:  Procedure Laterality Date  . INCISE AND DRAIN ABCESS     1994    SOCIAL HISTORY: Social History   Tobacco Use  . Smoking status: Never Smoker  . Smokeless tobacco: Never Used  Substance Use Topics  . Alcohol use: Yes    Comment: occasional  . Drug use: No    FAMILY HISTORY: Family History  Problem Relation Age of Onset  . Renal cancer Maternal Grandmother 70  . Alcohol abuse Maternal Grandmother   . Hyperlipidemia Maternal Grandmother   .  Hypertension Maternal Grandmother   . Arthritis Father   . Obesity Father   . Alcohol abuse Maternal Uncle   . Mental illness Maternal Uncle   . Mental illness Paternal Aunt   . Diabetes Paternal Aunt   . Hyperlipidemia Maternal Grandfather   . Hypertension Maternal Grandfather   . Stroke Paternal Grandmother   . Diabetes Paternal Grandmother   . Cancer Paternal Grandfather        lung  . Colon cancer Neg Hx   . Breast cancer Neg Hx     ROS: Review of Systems  Constitutional: Negative for weight loss.  Cardiovascular: Negative for chest pain and claudication.  Gastrointestinal: Negative for nausea and vomiting.  Musculoskeletal: Negative for  myalgias.       Negative for muscle weakness  Endo/Heme/Allergies:       Negative for polyphagia     PHYSICAL EXAM: Blood pressure 113/70, pulse (!) 56, temperature 98.5 F (36.9 C), temperature source Oral, height 5\' 9"  (1.753 m), weight 207 lb (93.9 kg), SpO2 99 %. Body mass index is 30.57 kg/m. Physical Exam  Constitutional: She is oriented to person, place, and time. She appears well-developed and well-nourished.  HENT:  Head: Normocephalic.  Cardiovascular: Normal rate.  Pulmonary/Chest: Effort normal.  Musculoskeletal: Normal range of motion.  Neurological: She is alert and oriented to person, place, and time.  Skin: Skin is warm and dry.  Psychiatric: She has a normal mood and affect. Her behavior is normal.  Vitals reviewed.   RECENT LABS AND TESTS: BMET    Component Value Date/Time   NA 140 10/09/2017 0910   K 4.3 10/09/2017 0910   CL 102 10/09/2017 0910   CO2 24 10/09/2017 0910   GLUCOSE 84 10/09/2017 0910   GLUCOSE 90 08/31/2017 0929   BUN 13 10/09/2017 0910   CREATININE 0.82 10/09/2017 0910   CALCIUM 9.0 10/09/2017 0910   GFRNONAA 94 10/09/2017 0910   GFRAA 109 10/09/2017 0910   Lab Results  Component Value Date   HGBA1C 5.5 10/09/2017   HGBA1C 5.4 08/31/2017   HGBA1C 5.6 08/28/2016   Lab Results  Component Value Date   INSULIN 5.8 10/09/2017   CBC    Component Value Date/Time   WBC 5.7 10/09/2017 0910   WBC 5.2 08/31/2017 0929   RBC 4.43 10/09/2017 0910   RBC 4.17 08/31/2017 0929   HGB 11.8 10/09/2017 0910   HCT 36.0 10/09/2017 0910   PLT 198.0 08/31/2017 0929   MCV 81 10/09/2017 0910   MCH 26.6 10/09/2017 0910   MCHC 32.8 10/09/2017 0910   MCHC 33.2 08/31/2017 0929   RDW 13.9 10/09/2017 0910   LYMPHSABS 1.5 10/09/2017 0910   MONOABS 0.5 08/31/2017 0929   EOSABS 0.1 10/09/2017 0910   BASOSABS 0.0 10/09/2017 0910   Iron/TIBC/Ferritin/ %Sat No results found for: IRON, TIBC, FERRITIN, IRONPCTSAT Lipid Panel     Component Value  Date/Time   CHOL 185 10/09/2017 0910   TRIG 57 10/09/2017 0910   HDL 59 10/09/2017 0910   CHOLHDL 3 08/31/2017 0929   VLDL 7.6 08/31/2017 0929   LDLCALC 115 (H) 10/09/2017 0910   Hepatic Function Panel     Component Value Date/Time   PROT 6.7 10/09/2017 0910   ALBUMIN 4.2 10/09/2017 0910   AST 16 10/09/2017 0910   ALT 10 10/09/2017 0910   ALKPHOS 37 (L) 10/09/2017 0910   BILITOT 0.3 10/09/2017 0910      Component Value Date/Time   TSH 0.89 08/31/2017 0929  TSH 0.85 08/28/2016 1402     Ref. Range 10/09/2017 09:10  Vitamin D, 25-Hydroxy Latest Ref Range: 30.0 - 100.0 ng/mL 77.6   ASSESSMENT AND PLAN: Other hyperlipidemia - Plan: Lipid Panel With LDL/HDL Ratio  Vitamin D deficiency - Plan: VITAMIN D 25 Hydroxy (Vit-D Deficiency, Fractures)  Insulin resistance - Plan: Comprehensive metabolic panel, Hemoglobin A1c, Insulin, random  At risk for osteoporosis  Class 1 obesity with serious comorbidity and body mass index (BMI) of 30.0 to 30.9 in adult, unspecified obesity type  PLAN: Hyperlipidemia Fannye was informed of the American Heart Association Guidelines emphasizing intensive lifestyle modifications as the first line treatment for hyperlipidemia. We discussed many lifestyle modifications today in depth, and Latrece will continue to work on decreasing saturated fats such as fatty red meat, butter and many fried foods. She will also increase vegetables and lean protein in her diet and continue to work on exercise and weight loss efforts. We will check labs today.   Vitamin D Deficiency Dazani was informed that low vitamin D levels contributes to fatigue and are associated with obesity, breast, and colon cancer. She agrees to continue to take OTC Vit D daily and will follow up for routine testing of vitamin D, at least 2-3 times per year. She was informed of the risk of over-replacement of vitamin D and agrees to not increase her dose unless she discusses this with [place]  first. Agrees to follow up with our clinic as directed.   Insulin Resistance Terisha will continue to work on weight loss, exercise, and decreasing simple carbohydrates in her diet to help decrease the risk of diabetes. We dicussed metformin including benefits and risks. She was informed that eating too many simple carbohydrates or too many calories at one sitting increases the likelihood of GI side effects.  Teghan agreed to follow up with [place] as directed to monitor her progress. We will check labs today.   At risk for osteopenia and osteoporosis Antigone was given extended  (15 minutes) osteoporosis prevention counseling today. Carrera is at risk for osteopenia and osteoporosis due to her vitamin D deficiency. She was encouraged to take her vitamin D and follow her higher calcium diet and increase strengthening exercise to help strengthen her bones and decrease her risk of osteopenia and osteoporosis.  Obesity Hanaa is currently in the action stage of change. As such, her goal is to continue with weight loss efforts She has agreed to keep a food journal with 1200 calories and 90g protein  Marguita has been instructed to work up to a goal of 150 minutes of combined cardio and strengthening exercise per week for weight loss and overall health benefits. We discussed the following Behavioral Modification Strategies today: decrease eating out and keeping healthy foods in the house.    Selby has agreed to follow up with our clinic in 2 weeks. She was informed of the importance of frequent follow up visits to maximize her success with intensive lifestyle modifications for her multiple health conditions.   OBESITY BEHAVIORAL INTERVENTION VISIT  Today's visit was # 7   Starting weight: 218 lb Starting date: 10/09/17 Today's weight : 207 lb Today's date: 02/04/2018 Total lbs lost to date: 11 lb    ASK: We discussed the diagnosis of obesity with Suzi Roots today and Tekila agreed  to give [place] permission to discuss obesity behavioral modification therapy today.  ASSESS: Rolena has the diagnosis of obesity and her BMI today is 30.55 Lam is in the action stage of  change   ADVISE: Aleyssa was educated on the multiple health risks of obesity as well as the benefit of weight loss to improve her health. She was advised of the need for long term treatment and the importance of lifestyle modifications to improve her current health and to decrease her risk of future health problems.  AGREE: Multiple dietary modification options and treatment options were discussed and  Katessa agreed to follow the recommendations documented in the above note.  ARRANGE: Madhuri was educated on the importance of frequent visits to treat obesity as outlined per CMS and USPSTF guidelines and agreed to schedule her next follow up appointment today.  Otis Peak, am acting as transcriptionist for Alois Cliche, PA-C I, Alois Cliche, PA-C have reviewed above note and agree with its content

## 2018-02-05 LAB — COMPREHENSIVE METABOLIC PANEL
ALBUMIN: 4.2 g/dL (ref 3.5–5.5)
ALK PHOS: 35 IU/L — AB (ref 39–117)
ALT: 9 IU/L (ref 0–32)
AST: 14 IU/L (ref 0–40)
Albumin/Globulin Ratio: 1.7 (ref 1.2–2.2)
BUN / CREAT RATIO: 14 (ref 9–23)
BUN: 12 mg/dL (ref 6–20)
Bilirubin Total: 0.3 mg/dL (ref 0.0–1.2)
CHLORIDE: 102 mmol/L (ref 96–106)
CO2: 26 mmol/L (ref 20–29)
CREATININE: 0.84 mg/dL (ref 0.57–1.00)
Calcium: 9.2 mg/dL (ref 8.7–10.2)
GFR calc Af Amer: 105 mL/min/{1.73_m2} (ref >59)
GFR calc non Af Amer: 91 mL/min/{1.73_m2} (ref >59)
GLOBULIN, TOTAL: 2.5 g/dL (ref 1.5–4.5)
GLUCOSE: 90 mg/dL (ref 65–99)
Potassium: 4.5 mmol/L (ref 3.5–5.2)
SODIUM: 140 mmol/L (ref 134–144)
TOTAL PROTEIN: 6.7 g/dL (ref 6.0–8.5)

## 2018-02-05 LAB — INSULIN, RANDOM: INSULIN: 6.9 u[IU]/mL (ref 2.6–24.9)

## 2018-02-05 LAB — LIPID PANEL WITH LDL/HDL RATIO
CHOLESTEROL TOTAL: 157 mg/dL (ref 100–199)
HDL: 60 mg/dL (ref >39)
LDL CALC: 91 mg/dL (ref 0–99)
LDl/HDL Ratio: 1.5 ratio (ref 0.0–3.2)
TRIGLYCERIDES: 32 mg/dL (ref 0–149)
VLDL CHOLESTEROL CAL: 6 mg/dL (ref 5–40)

## 2018-02-05 LAB — HEMOGLOBIN A1C
Est. average glucose Bld gHb Est-mCnc: 108 mg/dL
HEMOGLOBIN A1C: 5.4 % (ref 4.8–5.6)

## 2018-02-05 LAB — VITAMIN D 25 HYDROXY (VIT D DEFICIENCY, FRACTURES): Vit D, 25-Hydroxy: 69.5 ng/mL (ref 30.0–100.0)

## 2018-02-18 ENCOUNTER — Encounter (INDEPENDENT_AMBULATORY_CARE_PROVIDER_SITE_OTHER): Payer: Self-pay | Admitting: Physician Assistant

## 2018-02-18 ENCOUNTER — Ambulatory Visit (INDEPENDENT_AMBULATORY_CARE_PROVIDER_SITE_OTHER): Payer: 59 | Admitting: Physician Assistant

## 2018-02-18 VITALS — BP 123/77 | HR 55 | Temp 98.3°F | Ht 69.0 in | Wt 207.0 lb

## 2018-02-18 DIAGNOSIS — E669 Obesity, unspecified: Secondary | ICD-10-CM

## 2018-02-18 DIAGNOSIS — E8881 Metabolic syndrome: Secondary | ICD-10-CM

## 2018-02-18 DIAGNOSIS — Z6830 Body mass index (BMI) 30.0-30.9, adult: Secondary | ICD-10-CM

## 2018-02-18 DIAGNOSIS — E88819 Insulin resistance, unspecified: Secondary | ICD-10-CM

## 2018-02-18 DIAGNOSIS — E66811 Obesity, class 1: Secondary | ICD-10-CM

## 2018-02-19 NOTE — Progress Notes (Signed)
Office: [phone]  /  Fax: [phone]   HPI:    Chief Complaint: OBESITY Jamie Bean Bean here to discuss her progress with her obesity treatment plan. She Bean on the  keep a food journal with 1200 calories and 90 grams of protein  plan and Bean following her eating plan approximately 75 % of the time. She states she Bean doing Zumba 45-60 minutes 3 times per week. Jamie Bean Bean currently struggling with meal planning for dinner and Bean eating out at times. She Bean ready to get back on track.  Her weight Bean 207 lb (93.9 kg) today and Bean not lost weight since her last visit. She Bean lost 11 lbs since starting treatment with [place].  Insulin Resistance Jamie Bean Bean a diagnosis of insulin resistance based on her elevated fasting insulin level >5. Although Jamie Bean's blood glucose readings are still under good control, insulin resistance puts her at greater risk of metabolic syndrome and diabetes. She Bean not taking metformin currently and continues to work on diet and exercise to decrease risk of diabetes. She denies polyphagia.   ALLERGIES: Allergies  Allergen Reactions  . Other Hives  . Sulfa Antibiotics Hives    MEDICATIONS: Current Outpatient Medications on File Prior to Visit  Medication Sig Dispense Refill  . Biotin 1 MG CAPS Take 1 mg by mouth daily.    . Cholecalciferol (VITAMIN D3) 5000 units TABS Take by mouth daily.    [name] co-enzyme Q-10 30 MG capsule Take 30 mg by mouth daily.    . ferrous sulfate 325 (65 FE) MG tablet Take 325 mg by mouth daily.    . Lactobacillus (PROBIOTIC ACIDOPHILUS PO) Take 1 capsule by mouth daily.    . Melatonin 1 MG CAPS Take 1 capsule by mouth at bedtime.    . Multiple Vitamin (MULTIVITAMIN) tablet Take by mouth.     No current facility-administered medications on file prior to visit.     PAST MEDICAL HISTORY: Past Medical History:  Diagnosis Date  . Anemia   . Anxiety   . Arthritis   . Constipation   . Joint pain   . Kidney problem   . Knee pain     . Palpitations   . UTI (urinary tract infection)   . Vitamin D deficiency     PAST SURGICAL HISTORY: Past Surgical History:  Procedure Laterality Date  . INCISE AND DRAIN ABCESS     1994    SOCIAL HISTORY: Social History   Tobacco Use  . Smoking status: Never Smoker  . Smokeless tobacco: Never Used  Substance Use Topics  . Alcohol use: Yes    Comment: occasional  . Drug use: No    FAMILY HISTORY: Family History  Problem Relation Age of Onset  . Renal cancer Maternal Grandmother 70  . Alcohol abuse Maternal Grandmother   . Hyperlipidemia Maternal Grandmother   . Hypertension Maternal Grandmother   . Arthritis Father   . Obesity Father   . Alcohol abuse Maternal Uncle   . Mental illness Maternal Uncle   . Mental illness Paternal Aunt   . Diabetes Paternal Aunt   . Hyperlipidemia Maternal Grandfather   . Hypertension Maternal Grandfather   . Stroke Paternal Grandmother   . Diabetes Paternal Grandmother   . Cancer Paternal Grandfather        lung  . Colon cancer Neg Hx   . Breast cancer Neg Hx     ROS: Review of Systems  Constitutional: Negative for weight loss.  Endo/Heme/Allergies:  Negative for polyphagia.    PHYSICAL EXAM: Blood pressure 123/77, pulse (!) 55, temperature 98.3 F (36.8 C), temperature source Oral, height 5\' 9"  (1.753 m), weight 207 lb (93.9 kg), SpO2 100 %. Body mass index Bean 30.57 kg/m. Physical Exam  Constitutional: She Bean oriented to person, place, and time. She appears well-developed and well-nourished.  Cardiovascular: Normal rate.  Pulmonary/Chest: Effort normal.  Musculoskeletal: Normal range of motion.  Neurological: She Bean oriented to person, place, and time.  Skin: Skin Bean warm and dry.  Psychiatric: She Bean a normal mood and affect. Her behavior Bean normal.  Vitals reviewed.   RECENT LABS AND TESTS: BMET    Component Value Date/Time   NA 140 02/04/2018 1019   K 4.5 02/04/2018 1019   CL 102 02/04/2018 1019    CO2 26 02/04/2018 1019   GLUCOSE 90 02/04/2018 1019   GLUCOSE 90 08/31/2017 0929   BUN 12 02/04/2018 1019   CREATININE 0.84 02/04/2018 1019   CALCIUM 9.2 02/04/2018 1019   GFRNONAA 91 02/04/2018 1019   GFRAA 105 02/04/2018 1019   Lab Results  Component Value Date   HGBA1C 5.4 02/04/2018   HGBA1C 5.5 10/09/2017   HGBA1C 5.4 08/31/2017   HGBA1C 5.6 08/28/2016   Lab Results  Component Value Date   INSULIN 6.9 02/04/2018   INSULIN 5.8 10/09/2017   CBC    Component Value Date/Time   WBC 5.7 10/09/2017 0910   WBC 5.2 08/31/2017 0929   RBC 4.43 10/09/2017 0910   RBC 4.17 08/31/2017 0929   HGB 11.8 10/09/2017 0910   HCT 36.0 10/09/2017 0910   PLT 198.0 08/31/2017 0929   MCV 81 10/09/2017 0910   MCH 26.6 10/09/2017 0910   MCHC 32.8 10/09/2017 0910   MCHC 33.2 08/31/2017 0929   RDW 13.9 10/09/2017 0910   LYMPHSABS 1.5 10/09/2017 0910   MONOABS 0.5 08/31/2017 0929   EOSABS 0.1 10/09/2017 0910   BASOSABS 0.0 10/09/2017 0910   Iron/TIBC/Ferritin/ %Sat No results found for: IRON, TIBC, FERRITIN, IRONPCTSAT Lipid Panel     Component Value Date/Time   CHOL 157 02/04/2018 1019   TRIG 32 02/04/2018 1019   HDL 60 02/04/2018 1019   CHOLHDL 3 08/31/2017 0929   VLDL 7.6 08/31/2017 0929   LDLCALC 91 02/04/2018 1019   Hepatic Function Panel     Component Value Date/Time   PROT 6.7 02/04/2018 1019   ALBUMIN 4.2 02/04/2018 1019   AST 14 02/04/2018 1019   ALT 9 02/04/2018 1019   ALKPHOS 35 (L) 02/04/2018 1019   BILITOT 0.3 02/04/2018 1019      Component Value Date/Time   TSH 0.89 08/31/2017 0929   TSH 0.85 08/28/2016 1402   Results for Jamie, Bean (MRN [id number]) as of 02/19/2018 10:09  Ref. Range 02/04/2018 10:19  Vitamin D, 25-Hydroxy Latest Ref Range: 30.0 - 100.0 ng/mL 69.5   ASSESSMENT AND PLAN: Insulin resistance  Class 1 obesity with serious comorbidity and body mass index (BMI) of 30.0 to 30.9 in adult, unspecified obesity type  PLAN:  Insulin  Resistance Jamie Bean will continue to work on weight loss, exercise, and decreasing simple carbohydrates in her diet to help decrease the risk of diabetes.  She was informed that eating too many simple carbohydrates or too many calories at one sitting increases the likelihood of GI side effects. Jamie Bean agreed to follow up with [place] as directed to monitor her progress.  I spent > than 50% of the 15 minute visit on counseling as documented  in the note.  Obesity Jamie Bean Bean currently in the action stage of change. As such, her goal Bean to continue with weight loss efforts She Bean agreed to keep a food journal with 1200 calories and 90 grams of protein  Jamie Bean Bean been instructed to work up to a goal of 150 minutes of combined cardio and strengthening exercise per week for weight loss and overall health benefits. We discussed the following Behavioral Modification Strategies today: work on meal planning and easy cooking plans and keeping healthy foods in the home.   Jamie Bean agreed to follow up with our clinic in 2 weeks. She was informed of the importance of frequent follow up visits to maximize her success with intensive lifestyle modifications for her multiple health conditions.   OBESITY BEHAVIORAL INTERVENTION VISIT  Today's visit was # 8  Starting weight: 218 lbs Starting date: 10/09/17 Today's weight : 207 lbs Today's date: 02/18/18 Total lbs lost to date: 11  ASK: We discussed the diagnosis of obesity with Jamie Bean today and Jamie Bean agreed to give [place] permission to discuss obesity behavioral modification therapy today.  ASSESS: Jamie Bean Bean the diagnosis of obesity and her BMI today Bean 30.55. Jamie Bean in the action stage of change   ADVISE: Jamie Bean was educated on the multiple health risks of obesity as well as the benefit of weight loss to improve her health. She was advised of the need for long term treatment and the importance of lifestyle modifications to improve  her current health and to decrease her risk of future health problems.  AGREE: Multiple dietary modification options and treatment options were discussed and  Jamie Bean agreed to follow the recommendations documented in the above note.  ARRANGE: Jamie Bean was educated on the importance of frequent visits to treat obesity as outlined per CMS and USPSTF guidelines and agreed to schedule her next follow up appointment today.  Cristi Loron, am acting as Energy manager for [employer], PA-C. IAlois Cliche, PA-C have reviewed above note and agree with its content

## 2018-03-04 ENCOUNTER — Encounter (INDEPENDENT_AMBULATORY_CARE_PROVIDER_SITE_OTHER): Payer: Self-pay | Admitting: Physician Assistant

## 2018-03-04 ENCOUNTER — Ambulatory Visit (INDEPENDENT_AMBULATORY_CARE_PROVIDER_SITE_OTHER): Payer: 59 | Admitting: Physician Assistant

## 2018-03-04 VITALS — BP 113/69 | HR 66 | Temp 98.4°F | Ht 69.0 in | Wt 210.0 lb

## 2018-03-04 DIAGNOSIS — Z6831 Body mass index (BMI) 31.0-31.9, adult: Secondary | ICD-10-CM | POA: Diagnosis not present

## 2018-03-04 DIAGNOSIS — E8881 Metabolic syndrome: Secondary | ICD-10-CM

## 2018-03-04 DIAGNOSIS — E669 Obesity, unspecified: Secondary | ICD-10-CM | POA: Diagnosis not present

## 2018-03-04 DIAGNOSIS — E88819 Insulin resistance, unspecified: Secondary | ICD-10-CM

## 2018-03-05 NOTE — Progress Notes (Signed)
Office: [phone]  /  Fax: [phone]   HPI:   Chief Complaint: OBESITY Jamie Bean is here to discuss her progress with her obesity treatment plan. She is keeping a food journal with 1200 calories and 90 grams of protein and is following her eating plan approximately 75 % of the time. She states she is doing Zumba 45 minutes 3 times per week. Jamie Bean struggled to follow the plan the last 2 weeks due to eating out at different events. She is ready to get back on track.  Her weight is 210 lb (95.3 kg) today and has had a weight gain of 3 pounds over a period of 2 weeks since her last visit. She has lost 8 lbs since starting treatment with [place].  Insulin Resistance Jamie Bean has a diagnosis of insulin resistance based on her elevated fasting insulin level >5. Although Jamie Bean's blood glucose readings are still under good control, insulin resistance puts her at greater risk of metabolic syndrome and diabetes. She is not taking metformin currently and continues to work on diet and exercise to decrease risk of diabetes. She denies polyphagia.  ALLERGIES: Allergies  Allergen Reactions  . Other Hives  . Sulfa Antibiotics Hives    MEDICATIONS: Current Outpatient Medications on File Prior to Visit  Medication Sig Dispense Refill  . Biotin 1 MG CAPS Take 1 mg by mouth daily.    . Cholecalciferol (VITAMIN D3) 5000 units TABS Take by mouth daily.    [name] co-enzyme Q-10 30 MG capsule Take 30 mg by mouth daily.    . ferrous sulfate 325 (65 FE) MG tablet Take 325 mg by mouth daily.    . Lactobacillus (PROBIOTIC ACIDOPHILUS PO) Take 1 capsule by mouth daily.    . Melatonin 1 MG CAPS Take 1 capsule by mouth at bedtime.    . Multiple Vitamin (MULTIVITAMIN) tablet Take by mouth.     No current facility-administered medications on file prior to visit.     PAST MEDICAL HISTORY: Past Medical History:  Diagnosis Date  . Anemia   . Anxiety   . Arthritis   . Constipation   . Joint pain   . Kidney  problem   . Knee pain   . Palpitations   . UTI (urinary tract infection)   . Vitamin D deficiency     PAST SURGICAL HISTORY: Past Surgical History:  Procedure Laterality Date  . INCISE AND DRAIN ABCESS     1994    SOCIAL HISTORY: Social History   Tobacco Use  . Smoking status: Never Smoker  . Smokeless tobacco: Never Used  Substance Use Topics  . Alcohol use: Yes    Comment: occasional  . Drug use: No    FAMILY HISTORY: Family History  Problem Relation Age of Onset  . Renal cancer Maternal Grandmother 70  . Alcohol abuse Maternal Grandmother   . Hyperlipidemia Maternal Grandmother   . Hypertension Maternal Grandmother   . Arthritis Father   . Obesity Father   . Alcohol abuse Maternal Uncle   . Mental illness Maternal Uncle   . Mental illness Paternal Aunt   . Diabetes Paternal Aunt   . Hyperlipidemia Maternal Grandfather   . Hypertension Maternal Grandfather   . Stroke Paternal Grandmother   . Diabetes Paternal Grandmother   . Cancer Paternal Grandfather        lung  . Colon cancer Neg Hx   . Breast cancer Neg Hx     ROS: Review of Systems  Constitutional: Negative for  weight loss.  Endo/Heme/Allergies:       Negative for polyphagia.    PHYSICAL EXAM: Blood pressure 113/69, pulse 66, temperature 98.4 F (36.9 C), temperature source Oral, height 5\' 9"  (1.753 m), weight 210 lb (95.3 kg), SpO2 100 %. Body mass index is 31.01 kg/m. Physical Exam  Constitutional: She is oriented to person, place, and time. She appears well-developed and well-nourished.  Cardiovascular: Normal rate.  Pulmonary/Chest: Effort normal.  Musculoskeletal: Normal range of motion.  Neurological: She is oriented to person, place, and time.  Skin: Skin is warm and dry.  Psychiatric: She has a normal mood and affect. Her behavior is normal.  Vitals reviewed.   RECENT LABS AND TESTS: BMET    Component Value Date/Time   NA 140 02/04/2018 1019   K 4.5 02/04/2018 1019   CL 102  02/04/2018 1019   CO2 26 02/04/2018 1019   GLUCOSE 90 02/04/2018 1019   GLUCOSE 90 08/31/2017 0929   BUN 12 02/04/2018 1019   CREATININE 0.84 02/04/2018 1019   CALCIUM 9.2 02/04/2018 1019   GFRNONAA 91 02/04/2018 1019   GFRAA 105 02/04/2018 1019   Lab Results  Component Value Date   HGBA1C 5.4 02/04/2018   HGBA1C 5.5 10/09/2017   HGBA1C 5.4 08/31/2017   HGBA1C 5.6 08/28/2016   Lab Results  Component Value Date   INSULIN 6.9 02/04/2018   INSULIN 5.8 10/09/2017   CBC    Component Value Date/Time   WBC 5.7 10/09/2017 0910   WBC 5.2 08/31/2017 0929   RBC 4.43 10/09/2017 0910   RBC 4.17 08/31/2017 0929   HGB 11.8 10/09/2017 0910   HCT 36.0 10/09/2017 0910   PLT 198.0 08/31/2017 0929   MCV 81 10/09/2017 0910   MCH 26.6 10/09/2017 0910   MCHC 32.8 10/09/2017 0910   MCHC 33.2 08/31/2017 0929   RDW 13.9 10/09/2017 0910   LYMPHSABS 1.5 10/09/2017 0910   MONOABS 0.5 08/31/2017 0929   EOSABS 0.1 10/09/2017 0910   BASOSABS 0.0 10/09/2017 0910   Iron/TIBC/Ferritin/ %Sat No results found for: IRON, TIBC, FERRITIN, IRONPCTSAT Lipid Panel     Component Value Date/Time   CHOL 157 02/04/2018 1019   TRIG 32 02/04/2018 1019   HDL 60 02/04/2018 1019   CHOLHDL 3 08/31/2017 0929   VLDL 7.6 08/31/2017 0929   LDLCALC 91 02/04/2018 1019   Hepatic Function Panel     Component Value Date/Time   PROT 6.7 02/04/2018 1019   ALBUMIN 4.2 02/04/2018 1019   AST 14 02/04/2018 1019   ALT 9 02/04/2018 1019   ALKPHOS 35 (L) 02/04/2018 1019   BILITOT 0.3 02/04/2018 1019      Component Value Date/Time   TSH 0.89 08/31/2017 0929   TSH 0.85 08/28/2016 1402   Results for SADIA, BELFIORE (MRN [id number]) as of 03/05/2018 13:03  Ref. Range 02/04/2018 10:19  Vitamin D, 25-Hydroxy Latest Ref Range: 30.0 - 100.0 ng/mL 69.5   ASSESSMENT AND PLAN: Insulin resistance  Class 1 obesity with serious comorbidity and body mass index (BMI) of 31.0 to 31.9 in adult, unspecified obesity  type  PLAN:  Insulin Resistance Mahagony will continue to work on weight loss, exercise, and decreasing simple carbohydrates in her diet to help decrease the risk of diabetes. She was informed that eating too many simple carbohydrates or too many calories at one sitting increases the likelihood of GI side effects. She agrees to continue with diet, exercise, and weight loss. Alleene agreed to follow up with [place] as directed to  monitor her progress in 2 weeks.  I spent > than 50% of the 15 minute visit on counseling as documented in the note.  Obesity Tamani is currently in the action stage of change. As such, her goal is to continue with weight loss efforts. She has agreed to keep a food journal with 1200 calories and 90 grams of protein daily. Mariavictoria has been instructed to work up to a goal of 150 minutes of combined cardio and strengthening exercise per week for weight loss and overall health benefits. We discussed the following Behavioral Modification Strategies today: no skipping meals and work on meal planning and easy cooking plans.  Parneet has agreed to follow up with our clinic in 2 weeks. She was informed of the importance of frequent follow up visits to maximize her success with intensive lifestyle modifications for her multiple health conditions.   OBESITY BEHAVIORAL INTERVENTION VISIT  Today's visit was # 9  Starting weight: 218 lbs Starting date: 10/09/17 Today's weight : Weight: 210 lb (95.3 kg)  Today's date: 03/04/2018 Total lbs lost to date: 8  ASK: We discussed the diagnosis of obesity with Suzi Roots today and Elanah agreed to give [place] permission to discuss obesity behavioral modification therapy today.  ASSESS: Jola has the diagnosis of obesity and her BMI today is 31.0 Toni is in the action stage of change.   ADVISE: Abagale was educated on the multiple health risks of obesity as well as the benefit of weight loss to improve her health. She  was advised of the need for long term treatment and the importance of lifestyle modifications to improve her current health and to decrease her risk of future health problems.  AGREE: Multiple dietary modification options and treatment options were discussed and Maezie agreed to follow the recommendations documented in the above note.  ARRANGE: Nelson was educated on the importance of frequent visits to treat obesity as outlined per CMS and USPSTF guidelines and agreed to schedule her next follow up appointment today.  Launa Flight, am acting as transcriptionist for Alois Cliche, PA-C I, Alois Cliche, PA-C have reviewed above note and agree with its content

## 2018-03-19 ENCOUNTER — Encounter (INDEPENDENT_AMBULATORY_CARE_PROVIDER_SITE_OTHER): Payer: Self-pay | Admitting: Physician Assistant

## 2018-03-19 ENCOUNTER — Ambulatory Visit (INDEPENDENT_AMBULATORY_CARE_PROVIDER_SITE_OTHER): Payer: 59 | Admitting: Physician Assistant

## 2018-03-19 VITALS — BP 106/66 | HR 57 | Temp 98.5°F | Ht 69.0 in | Wt 204.0 lb

## 2018-03-19 DIAGNOSIS — Z6830 Body mass index (BMI) 30.0-30.9, adult: Secondary | ICD-10-CM

## 2018-03-19 DIAGNOSIS — E66811 Obesity, class 1: Secondary | ICD-10-CM

## 2018-03-19 DIAGNOSIS — E559 Vitamin D deficiency, unspecified: Secondary | ICD-10-CM | POA: Diagnosis not present

## 2018-03-19 DIAGNOSIS — E669 Obesity, unspecified: Secondary | ICD-10-CM

## 2018-03-20 NOTE — Progress Notes (Signed)
Office: [phone]  /  Fax: [phone]   HPI:   Chief Complaint: OBESITY Jamie Bean is here to discuss her progress with her obesity treatment plan. She is on the keep a food journal with 1200 calories and 90 grams of protein daily plan and is following her eating plan approximately 80 % of the time. She states she is doing Zumba and weights 45 to 60 minutes 3 to 4 times per week. Jamie Bean did very well with weight loss. She reports that she has been more mindful of what she has been eating and she is eating smaller portions at dinner. Her weight is 204 lb (92.5 kg) today and has had a weight loss of 6 pounds over a period of 2 weeks since her last visit. She has lost 14 lbs since starting treatment with [place].  Vitamin D deficiency Jamie Bean has a diagnosis of vitamin D deficiency. She is currently taking OTC vit D and denies nausea, vomiting or muscle weakness.  ALLERGIES: Allergies  Allergen Reactions  . Other Hives  . Sulfa Antibiotics Hives    MEDICATIONS: Current Outpatient Medications on File Prior to Visit  Medication Sig Dispense Refill  . Biotin 1 MG CAPS Take 1 mg by mouth daily.    . Cholecalciferol (VITAMIN D3) 5000 units TABS Take by mouth daily.    [name] co-enzyme Q-10 30 MG capsule Take 30 mg by mouth daily.    . ferrous sulfate 325 (65 FE) MG tablet Take 325 mg by mouth daily.    . Lactobacillus (PROBIOTIC ACIDOPHILUS PO) Take 1 capsule by mouth daily.    . Melatonin 1 MG CAPS Take 1 capsule by mouth at bedtime.    . Multiple Vitamin (MULTIVITAMIN) tablet Take by mouth.     No current facility-administered medications on file prior to visit.     PAST MEDICAL HISTORY: Past Medical History:  Diagnosis Date  . Anemia   . Anxiety   . Arthritis   . Constipation   . Joint pain   . Kidney problem   . Knee pain   . Palpitations   . UTI (urinary tract infection)   . Vitamin D deficiency     PAST SURGICAL HISTORY: Past Surgical History:  Procedure Laterality  Date  . INCISE AND DRAIN ABCESS     1994    SOCIAL HISTORY: Social History   Tobacco Use  . Smoking status: Never Smoker  . Smokeless tobacco: Never Used  Substance Use Topics  . Alcohol use: Yes    Comment: occasional  . Drug use: No    FAMILY HISTORY: Family History  Problem Relation Age of Onset  . Renal cancer Maternal Grandmother 70  . Alcohol abuse Maternal Grandmother   . Hyperlipidemia Maternal Grandmother   . Hypertension Maternal Grandmother   . Arthritis Father   . Obesity Father   . Alcohol abuse Maternal Uncle   . Mental illness Maternal Uncle   . Mental illness Paternal Aunt   . Diabetes Paternal Aunt   . Hyperlipidemia Maternal Grandfather   . Hypertension Maternal Grandfather   . Stroke Paternal Grandmother   . Diabetes Paternal Grandmother   . Cancer Paternal Grandfather        lung  . Colon cancer Neg Hx   . Breast cancer Neg Hx     ROS: Review of Systems  Constitutional: Positive for weight loss.  Gastrointestinal: Negative for nausea and vomiting.  Musculoskeletal:       Negative for muscle weakness  PHYSICAL EXAM: Blood pressure 106/66, pulse (!) 57, temperature 98.5 F (36.9 C), temperature source Oral, height 5\' 9"  (1.753 m), weight 204 lb (92.5 kg), SpO2 100 %. Body mass index is 30.13 kg/m. Physical Exam  Constitutional: She is oriented to person, place, and time. She appears well-developed and well-nourished.  Cardiovascular: Normal rate.  Pulmonary/Chest: Effort normal.  Musculoskeletal: Normal range of motion.  Neurological: She is oriented to person, place, and time.  Skin: Skin is warm and dry.  Psychiatric: She has a normal mood and affect. Her behavior is normal.  Vitals reviewed.   RECENT LABS AND TESTS: BMET    Component Value Date/Time   NA 140 02/04/2018 1019   K 4.5 02/04/2018 1019   CL 102 02/04/2018 1019   CO2 26 02/04/2018 1019   GLUCOSE 90 02/04/2018 1019   GLUCOSE 90 08/31/2017 0929   BUN 12  02/04/2018 1019   CREATININE 0.84 02/04/2018 1019   CALCIUM 9.2 02/04/2018 1019   GFRNONAA 91 02/04/2018 1019   GFRAA 105 02/04/2018 1019   Lab Results  Component Value Date   HGBA1C 5.4 02/04/2018   HGBA1C 5.5 10/09/2017   HGBA1C 5.4 08/31/2017   HGBA1C 5.6 08/28/2016   Lab Results  Component Value Date   INSULIN 6.9 02/04/2018   INSULIN 5.8 10/09/2017   CBC    Component Value Date/Time   WBC 5.7 10/09/2017 0910   WBC 5.2 08/31/2017 0929   RBC 4.43 10/09/2017 0910   RBC 4.17 08/31/2017 0929   HGB 11.8 10/09/2017 0910   HCT 36.0 10/09/2017 0910   PLT 198.0 08/31/2017 0929   MCV 81 10/09/2017 0910   MCH 26.6 10/09/2017 0910   MCHC 32.8 10/09/2017 0910   MCHC 33.2 08/31/2017 0929   RDW 13.9 10/09/2017 0910   LYMPHSABS 1.5 10/09/2017 0910   MONOABS 0.5 08/31/2017 0929   EOSABS 0.1 10/09/2017 0910   BASOSABS 0.0 10/09/2017 0910   Iron/TIBC/Ferritin/ %Sat No results found for: IRON, TIBC, FERRITIN, IRONPCTSAT Lipid Panel     Component Value Date/Time   CHOL 157 02/04/2018 1019   TRIG 32 02/04/2018 1019   HDL 60 02/04/2018 1019   CHOLHDL 3 08/31/2017 0929   VLDL 7.6 08/31/2017 0929   LDLCALC 91 02/04/2018 1019   Hepatic Function Panel     Component Value Date/Time   PROT 6.7 02/04/2018 1019   ALBUMIN 4.2 02/04/2018 1019   AST 14 02/04/2018 1019   ALT 9 02/04/2018 1019   ALKPHOS 35 (L) 02/04/2018 1019   BILITOT 0.3 02/04/2018 1019      Component Value Date/Time   TSH 0.89 08/31/2017 0929   TSH 0.85 08/28/2016 1402   Results for Jamie Bean, Jamie Bean (MRN [id number]) as of 03/20/2018 09:21  Ref. Range 02/04/2018 10:19  Vitamin D, 25-Hydroxy Latest Ref Range: 30.0 - 100.0 ng/mL 69.5   ASSESSMENT AND PLAN: Vitamin D deficiency  Class 1 obesity with serious comorbidity and body mass index (BMI) of 30.0 to 30.9 in adult, unspecified obesity type  PLAN:  Vitamin D Deficiency Jamie Bean was informed that low vitamin D levels contributes to fatigue and are  associated with obesity, breast, and colon cancer. She agrees to continue to take OTC Vit D and will follow up for routine testing of vitamin D, at least 2-3 times per year. She was informed of the risk of over-replacement of vitamin D and agrees to not increase her dose unless she discusses this with us first.  I spent > than 50% of the 15  minute visit on counseling as documented in the note.  Obesity Jamie Bean is currently in the action stage of change. As such, her goal is to continue with weight loss efforts She has agreed to keep a food journal with 1200 calories and 90 grams of protein daily Jamie Bean has been instructed to work up to a goal of 150 minutes of combined cardio and strengthening exercise per week for weight loss and overall health benefits. We discussed the following Behavioral Modification Strategies today: work on meal planning and easy cooking plans and keep a strict food journal  Jamie Bean has agreed to follow up with our clinic in 2 weeks. She was informed of the importance of frequent follow up visits to maximize her success with intensive lifestyle modifications for her multiple health conditions.   OBESITY BEHAVIORAL INTERVENTION VISIT  Today's visit was # 10   Starting weight: 218 lbs Starting date: 10/09/17 Today's weight : 204 lbs  Today's date: 03/19/2018 Total lbs lost to date: 14   ASK: We discussed the diagnosis of obesity with Jamie Roots today and Jamie Bean agreed to give [place] permission to discuss obesity behavioral modification therapy today.  ASSESS: Jamie Bean has the diagnosis of obesity and her BMI today is 30.11 Jamie Bean is in the action stage of change   ADVISE: Jamie Bean was educated on the multiple health risks of obesity as well as the benefit of weight loss to improve her health. She was advised of the need for long term treatment and the importance of lifestyle modifications to improve her current health and to decrease her risk of future  health problems.  AGREE: Multiple dietary modification options and treatment options were discussed and  Jamie Bean agreed to follow the recommendations documented in the above note.  ARRANGE: Jamie Bean was educated on the importance of frequent visits to treat obesity as outlined per CMS and USPSTF guidelines and agreed to schedule her next follow up appointment today.  Cristi Loron, am acting as transcriptionist for Alois Cliche, PA-C I, Alois Cliche, PA-C have reviewed above note and agree with its content

## 2018-04-03 ENCOUNTER — Encounter (INDEPENDENT_AMBULATORY_CARE_PROVIDER_SITE_OTHER): Payer: Self-pay | Admitting: Physician Assistant

## 2018-04-03 ENCOUNTER — Ambulatory Visit (INDEPENDENT_AMBULATORY_CARE_PROVIDER_SITE_OTHER): Payer: 59 | Admitting: Physician Assistant

## 2018-04-03 VITALS — BP 112/70 | HR 61 | Temp 99.0°F | Ht 69.0 in | Wt 212.0 lb

## 2018-04-03 DIAGNOSIS — E669 Obesity, unspecified: Secondary | ICD-10-CM

## 2018-04-03 DIAGNOSIS — Z6831 Body mass index (BMI) 31.0-31.9, adult: Secondary | ICD-10-CM

## 2018-04-03 DIAGNOSIS — E559 Vitamin D deficiency, unspecified: Secondary | ICD-10-CM | POA: Diagnosis not present

## 2018-04-10 NOTE — Progress Notes (Signed)
Office: [phone]  /  Fax: [phone]   HPI:   Chief Complaint: OBESITY Jamie Bean is here to discuss her progress with her obesity treatment plan. She is keeping a food journal with 1200 calories and 90 protein  and is following her eating plan approximately 85 % of the time. She states she is doing zumba and light weights 45-60 minutes 3-4 times per week. Jamie Bean reports celebrating her anniversary and not staying on track. She also states that she is not getting all of her protein in.   Her weight is 212 lb (96.2 kg) today and had a weight gain of 8 lbs since her last visit. She has lost 14 lbs since starting treatment with [place].  Vitamin D deficiency Jamie Bean has a diagnosis of vitamin D deficiency. She is currently taking OTC Vit D and denies nausea, vomiting or muscle weakness.   ALLERGIES: Allergies  Allergen Reactions  . Other Hives  . Sulfa Antibiotics Hives    MEDICATIONS: Current Outpatient Medications on File Prior to Visit  Medication Sig Dispense Refill  . Biotin 1 MG CAPS Take 1 mg by mouth daily.    . Cholecalciferol (VITAMIN D3) 5000 units TABS Take by mouth daily.    [name] co-enzyme Q-10 30 MG capsule Take 30 mg by mouth daily.    . ferrous sulfate 325 (65 FE) MG tablet Take 325 mg by mouth daily.    . Lactobacillus (PROBIOTIC ACIDOPHILUS PO) Take 1 capsule by mouth daily.    . Melatonin 1 MG CAPS Take 1 capsule by mouth at bedtime.    . Multiple Vitamin (MULTIVITAMIN) tablet Take by mouth.     No current facility-administered medications on file prior to visit.     PAST MEDICAL HISTORY: Past Medical History:  Diagnosis Date  . Anemia   . Anxiety   . Arthritis   . Constipation   . Joint pain   . Kidney problem   . Knee pain   . Palpitations   . UTI (urinary tract infection)   . Vitamin D deficiency     PAST SURGICAL HISTORY: Past Surgical History:  Procedure Laterality Date  . INCISE AND DRAIN ABCESS     1994    SOCIAL HISTORY: Social  History   Tobacco Use  . Smoking status: Never Smoker  . Smokeless tobacco: Never Used  Substance Use Topics  . Alcohol use: Yes    Comment: occasional  . Drug use: No    FAMILY HISTORY: Family History  Problem Relation Age of Onset  . Renal cancer Maternal Grandmother 70  . Alcohol abuse Maternal Grandmother   . Hyperlipidemia Maternal Grandmother   . Hypertension Maternal Grandmother   . Arthritis Father   . Obesity Father   . Alcohol abuse Maternal Uncle   . Mental illness Maternal Uncle   . Mental illness Paternal Aunt   . Diabetes Paternal Aunt   . Hyperlipidemia Maternal Grandfather   . Hypertension Maternal Grandfather   . Stroke Paternal Grandmother   . Diabetes Paternal Grandmother   . Cancer Paternal Grandfather        lung  . Colon cancer Neg Hx   . Breast cancer Neg Hx     ROS: Review of Systems  Constitutional: Negative for weight loss.  Gastrointestinal: Negative for nausea and vomiting.  Musculoskeletal:       Negative muscle weakness    PHYSICAL EXAM: Blood pressure 112/70, pulse 61, temperature 99 F (37.2 C), temperature source Oral, height 5\' 9"  (  1.753 m), weight 212 lb (96.2 kg), SpO2 97 %. Body mass index is 31.31 kg/m. Physical Exam  Constitutional: She is oriented to person, place, and time. She appears well-developed and well-nourished.  Cardiovascular: Normal rate.  Pulmonary/Chest: Effort normal.  Neurological: She is alert and oriented to person, place, and time.  Skin: Skin is warm and dry.  Psychiatric: She has a normal mood and affect. Her behavior is normal.  Vitals reviewed.   RECENT LABS AND TESTS: BMET    Component Value Date/Time   NA 140 02/04/2018 1019   K 4.5 02/04/2018 1019   CL 102 02/04/2018 1019   CO2 26 02/04/2018 1019   GLUCOSE 90 02/04/2018 1019   GLUCOSE 90 08/31/2017 0929   BUN 12 02/04/2018 1019   CREATININE 0.84 02/04/2018 1019   CALCIUM 9.2 02/04/2018 1019   GFRNONAA 91 02/04/2018 1019   GFRAA 105  02/04/2018 1019   Lab Results  Component Value Date   HGBA1C 5.4 02/04/2018   HGBA1C 5.5 10/09/2017   HGBA1C 5.4 08/31/2017   HGBA1C 5.6 08/28/2016   Lab Results  Component Value Date   INSULIN 6.9 02/04/2018   INSULIN 5.8 10/09/2017   CBC    Component Value Date/Time   WBC 5.7 10/09/2017 0910   WBC 5.2 08/31/2017 0929   RBC 4.43 10/09/2017 0910   RBC 4.17 08/31/2017 0929   HGB 11.8 10/09/2017 0910   HCT 36.0 10/09/2017 0910   PLT 198.0 08/31/2017 0929   MCV 81 10/09/2017 0910   MCH 26.6 10/09/2017 0910   MCHC 32.8 10/09/2017 0910   MCHC 33.2 08/31/2017 0929   RDW 13.9 10/09/2017 0910   LYMPHSABS 1.5 10/09/2017 0910   MONOABS 0.5 08/31/2017 0929   EOSABS 0.1 10/09/2017 0910   BASOSABS 0.0 10/09/2017 0910   Iron/TIBC/Ferritin/ %Sat No results found for: IRON, TIBC, FERRITIN, IRONPCTSAT Lipid Panel     Component Value Date/Time   CHOL 157 02/04/2018 1019   TRIG 32 02/04/2018 1019   HDL 60 02/04/2018 1019   CHOLHDL 3 08/31/2017 0929   VLDL 7.6 08/31/2017 0929   LDLCALC 91 02/04/2018 1019   Hepatic Function Panel     Component Value Date/Time   PROT 6.7 02/04/2018 1019   ALBUMIN 4.2 02/04/2018 1019   AST 14 02/04/2018 1019   ALT 9 02/04/2018 1019   ALKPHOS 35 (L) 02/04/2018 1019   BILITOT 0.3 02/04/2018 1019      Component Value Date/Time   TSH 0.89 08/31/2017 0929   TSH 0.85 08/28/2016 1402   Results for Jamie Bean (MRN [id number]) as of 04/10/2018 08:14  Ref. Range 08/28/2016 14:02  VITD Latest Ref Range: 30.00 - 100.00 ng/mL 23.00 (L)   ASSESSMENT AND PLAN: Vitamin D deficiency  Class 1 obesity with serious comorbidity and body mass index (BMI) of 31.0 to 31.9 in adult, unspecified obesity type  PLAN: Vitamin D Deficiency Jamie Bean was informed that low vitamin D levels contributes to fatigue and are associated with obesity, breast, and colon cancer. She agrees to continue taking OTC Vit D and will follow up for routine testing of vitamin D, at  least 2-3 times per year. She was informed of the risk of over-replacement of vitamin D and agrees to not increase her dose unless she discusses this with [place] first. Jamie Bean agrees to follow up with our office in 2 weeks.   Obesity Jamie Bean is currently in the action stage of change. As such, her goal is to continue with weight loss efforts She  has agreed to follow the Category 2 plan Jamie Bean has been instructed to work up to a goal of 150 minutes of combined cardio and strengthening exercise per week for weight loss and overall health benefits. We discussed the following Behavioral Modification Strategies today: work on meal planning and easy cooking plans and holiday eating strategies    Jamie Bean has agreed to follow up with our clinic in 2 weeks. She was informed of the importance of frequent follow up visits to maximize her success with intensive lifestyle modifications for her multiple health conditions.   OBESITY BEHAVIORAL INTERVENTION VISIT  Today's visit was # 15   Starting weight: 218 lbs Starting date: 10/09/2017 Today's weight : Weight: 212 lb (96.2 kg)  Today's date: 04/03/2018 Total lbs lost to date: 14 lbs I spent > than 50% of the 15 minute visit on counseling as documented in the note.   ASK: We discussed the diagnosis of obesity with Jamie Bean today and Jamie Bean agreed to give us permission to discuss obesity behavioral modification therapy today.  ASSESS: Jamie Bean has the diagnosis of obesity and her BMI today is 31.29 Jamie Bean is in the action stage of change   ADVISE: Jamie Bean was educated on the multiple health risks of obesity as well as the benefit of weight loss to improve her health. She was advised of the need for long term treatment and the importance of lifestyle modifications to improve her current health and to decrease her risk of future health problems.  AGREE: Multiple dietary modification options and treatment options were discussed and   Jamie Bean agreed to follow the recommendations documented in the above note.  ARRANGE: Jamie Bean was educated on the importance of frequent visits to treat obesity as outlined per CMS and USPSTF guidelines and agreed to schedule her next follow up appointment today.  I, Ellery PlunkShanon Wright, CMA, am acting as transcriptionist for Alois Clicheracey Eniola Cerullo, PA-C I, Alois Clicheracey Zuly Belkin, PA-C have reviewed above note and agree with its content

## 2018-04-17 ENCOUNTER — Encounter (INDEPENDENT_AMBULATORY_CARE_PROVIDER_SITE_OTHER): Payer: Self-pay | Admitting: Physician Assistant

## 2018-04-17 ENCOUNTER — Ambulatory Visit (INDEPENDENT_AMBULATORY_CARE_PROVIDER_SITE_OTHER): Payer: 59 | Admitting: Physician Assistant

## 2018-04-17 VITALS — BP 124/81 | HR 57 | Temp 97.8°F | Ht 69.0 in | Wt 207.0 lb

## 2018-04-17 DIAGNOSIS — Z6830 Body mass index (BMI) 30.0-30.9, adult: Secondary | ICD-10-CM

## 2018-04-17 DIAGNOSIS — E669 Obesity, unspecified: Secondary | ICD-10-CM | POA: Diagnosis not present

## 2018-04-17 DIAGNOSIS — E66811 Obesity, class 1: Secondary | ICD-10-CM

## 2018-04-17 DIAGNOSIS — E559 Vitamin D deficiency, unspecified: Secondary | ICD-10-CM

## 2018-04-18 NOTE — Progress Notes (Signed)
Office: [phone]  /  Fax: [phone]   HPI:   Chief Complaint: OBESITY Jamie Bean is here to discuss her progress with her obesity treatment plan. She is on the Category 2 plan and is following her eating plan approximately 70 % of the time. She states she is doing Zumba for 45-60 minutes 2 times per week. Jamie Bean did very well with weight loss. She reports that she did well on Thanksgiving, but has been eating simple carbohydrates periodically since then.  Her weight is 207 lb (93.9 kg) Bean and has had a weight loss of 5 pounds over a period of 2 weeks since her last visit. She has lost 11 lbs since starting treatment with [place].  Vitamin D Deficiency Jamie Bean has a diagnosis of vitamin D deficiency. She is currently taking OTC Vit D and denies nausea, vomiting or muscle weakness.  ALLERGIES: Allergies  Allergen Reactions  . Other Hives  . Sulfa Antibiotics Hives    MEDICATIONS: Current Outpatient Medications on File Prior to Visit  Medication Sig Dispense Refill  . Biotin 1 MG CAPS Take 1 mg by mouth daily.    . Cholecalciferol (VITAMIN D3) 5000 units TABS Take by mouth daily.    [name] co-enzyme Q-10 30 MG capsule Take 30 mg by mouth daily.    . ferrous sulfate 325 (65 FE) MG tablet Take 325 mg by mouth daily.    . Lactobacillus (PROBIOTIC ACIDOPHILUS PO) Take 1 capsule by mouth daily.    [name] letrozole (FEMARA) 2.5 MG tablet Take 2.5 mg by mouth daily. Take one every 3 days    . Melatonin 1 MG CAPS Take 1 capsule by mouth at bedtime.    . Multiple Vitamin (MULTIVITAMIN) tablet Take by mouth.     No current facility-administered medications on file prior to visit.     PAST MEDICAL HISTORY: Past Medical History:  Diagnosis Date  . Anemia   . Anxiety   . Arthritis   . Constipation   . Joint pain   . Kidney problem   . Knee pain   . Palpitations   . UTI (urinary tract infection)   . Vitamin D deficiency     PAST SURGICAL HISTORY: Past Surgical History:  Procedure  Laterality Date  . INCISE AND DRAIN ABCESS     1994    SOCIAL HISTORY: Social History   Tobacco Use  . Smoking status: Never Smoker  . Smokeless tobacco: Never Used  Substance Use Topics  . Alcohol use: Yes    Comment: occasional  . Drug use: No    FAMILY HISTORY: Family History  Problem Relation Age of Onset  . Renal cancer Maternal Grandmother 70  . Alcohol abuse Maternal Grandmother   . Hyperlipidemia Maternal Grandmother   . Hypertension Maternal Grandmother   . Arthritis Father   . Obesity Father   . Alcohol abuse Maternal Uncle   . Mental illness Maternal Uncle   . Mental illness Paternal Aunt   . Diabetes Paternal Aunt   . Hyperlipidemia Maternal Grandfather   . Hypertension Maternal Grandfather   . Stroke Paternal Grandmother   . Diabetes Paternal Grandmother   . Cancer Paternal Grandfather        lung  . Colon cancer Neg Hx   . Breast cancer Neg Hx     ROS: Review of Systems  Constitutional: Positive for weight loss.  Gastrointestinal: Negative for nausea and vomiting.  Musculoskeletal:       Negative muscle weakness  PHYSICAL EXAM: Blood pressure 124/81, pulse (!) 57, temperature 97.8 F (36.6 C), temperature source Oral, height 5\' 9"  (1.753 m), weight 207 lb (93.9 kg), SpO2 98 %. Body mass index is 30.57 kg/m. Physical Exam Vitals signs reviewed.  Constitutional:      Appearance: Normal appearance. She is obese.  Cardiovascular:     Rate and Rhythm: Normal rate.  Pulmonary:     Effort: Pulmonary effort is normal.  Musculoskeletal: Normal range of motion.  Skin:    General: Skin is warm and dry.  Neurological:     Mental Status: She is alert and oriented to person, place, and time.  Psychiatric:        Mood and Affect: Mood normal.        Behavior: Behavior normal.     RECENT LABS AND TESTS: BMET    Component Value Date/Time   NA 140 02/04/2018 1019   K 4.5 02/04/2018 1019   CL 102 02/04/2018 1019   CO2 26 02/04/2018 1019    GLUCOSE 90 02/04/2018 1019   GLUCOSE 90 08/31/2017 0929   BUN 12 02/04/2018 1019   CREATININE 0.84 02/04/2018 1019   CALCIUM 9.2 02/04/2018 1019   GFRNONAA 91 02/04/2018 1019   GFRAA 105 02/04/2018 1019   Lab Results  Component Value Date   HGBA1C 5.4 02/04/2018   HGBA1C 5.5 10/09/2017   HGBA1C 5.4 08/31/2017   HGBA1C 5.6 08/28/2016   Lab Results  Component Value Date   INSULIN 6.9 02/04/2018   INSULIN 5.8 10/09/2017   CBC    Component Value Date/Time   WBC 5.7 10/09/2017 0910   WBC 5.2 08/31/2017 0929   RBC 4.43 10/09/2017 0910   RBC 4.17 08/31/2017 0929   HGB 11.8 10/09/2017 0910   HCT 36.0 10/09/2017 0910   PLT 198.0 08/31/2017 0929   MCV 81 10/09/2017 0910   MCH 26.6 10/09/2017 0910   MCHC 32.8 10/09/2017 0910   MCHC 33.2 08/31/2017 0929   RDW 13.9 10/09/2017 0910   LYMPHSABS 1.5 10/09/2017 0910   MONOABS 0.5 08/31/2017 0929   EOSABS 0.1 10/09/2017 0910   BASOSABS 0.0 10/09/2017 0910   Iron/TIBC/Ferritin/ %Sat No results found for: IRON, TIBC, FERRITIN, IRONPCTSAT Lipid Panel     Component Value Date/Time   CHOL 157 02/04/2018 1019   TRIG 32 02/04/2018 1019   HDL 60 02/04/2018 1019   CHOLHDL 3 08/31/2017 0929   VLDL 7.6 08/31/2017 0929   LDLCALC 91 02/04/2018 1019   Hepatic Function Panel     Component Value Date/Time   PROT 6.7 02/04/2018 1019   ALBUMIN 4.2 02/04/2018 1019   AST 14 02/04/2018 1019   ALT 9 02/04/2018 1019   ALKPHOS 35 (L) 02/04/2018 1019   BILITOT 0.3 02/04/2018 1019      Component Value Date/Time   TSH 0.89 08/31/2017 0929   TSH 0.85 08/28/2016 1402    ASSESSMENT AND PLAN: Vitamin D deficiency  Class 1 obesity with serious comorbidity and body mass index (BMI) of 30.0 to 30.9 in adult, unspecified obesity type  PLAN:  Vitamin D Deficiency Jamie Bean was informed that low vitamin D levels contributes to fatigue and are associated with obesity, breast, and colon cancer. Jamie Bean agrees to continue taking OTC Vitamin D and  will follow up for routine testing of vitamin D, at least 2-3 times per year. She was informed of the risk of over-replacement of vitamin D and agrees to not increase her dose unless she discusses this with us first. Jamie Bean  agrees to follow up with out clinic in 3 weeks.  I spent > than 50% of the 15 minute visit on counseling as documented in the note.  Obesity Jamie Bean is currently in the action stage of change. As such, her goal is to continue with weight loss efforts She has agreed to follow the Category 2 plan Jamie Bean has been instructed to work up to a goal of 150 minutes of combined cardio and strengthening exercise per week for weight loss and overall health benefits. Jamie discussed the following Behavioral Modification Strategies Bean: work on meal planning and easy cooking plans   Jamie Bean has agreed to follow up with our clinic in 3 weeks. She was informed of the importance of frequent follow up visits to maximize her success with intensive lifestyle modifications for her multiple health conditions.   OBESITY BEHAVIORAL INTERVENTION VISIT  Bean's visit was # 12 .  Starting weight: 218 lbs Starting date: 10/09/17 Bean's weight : 207 lbs Bean's date: 04/17/2018 Total lbs lost to date: 11    ASK: Jamie Bean.  ASSESS: Jamie Bean has the diagnosis of obesity and her BMI Bean is 30.55 Jamie Bean is in the action stage of change   ADVISE: Jamie Bean was educated on the multiple health risks of obesity as well as the benefit of weight loss to improve her health. She was advised of the need for long term treatment and the importance of lifestyle modifications.  AGREE: Multiple dietary modification options and treatment options were discussed and  Jamie Bean agreed to the above obesity treatment plan.  I, Tammy Wysor, am acting as  Energy manager for [employer], PA-C I, Alois Cliche, PA-C have reviewed above note and agree with its content

## 2018-05-13 ENCOUNTER — Encounter (INDEPENDENT_AMBULATORY_CARE_PROVIDER_SITE_OTHER): Payer: Self-pay | Admitting: Physician Assistant

## 2018-05-13 ENCOUNTER — Ambulatory Visit (INDEPENDENT_AMBULATORY_CARE_PROVIDER_SITE_OTHER): Payer: 59 | Admitting: Physician Assistant

## 2018-05-13 VITALS — BP 113/73 | HR 60 | Temp 97.9°F | Ht 69.0 in | Wt 212.0 lb

## 2018-05-13 DIAGNOSIS — Z6831 Body mass index (BMI) 31.0-31.9, adult: Secondary | ICD-10-CM

## 2018-05-13 DIAGNOSIS — E669 Obesity, unspecified: Secondary | ICD-10-CM | POA: Diagnosis not present

## 2018-05-13 DIAGNOSIS — E559 Vitamin D deficiency, unspecified: Secondary | ICD-10-CM | POA: Diagnosis not present

## 2018-05-14 NOTE — Progress Notes (Signed)
Office: [phone]  /  Fax: [phone]   HPI:   Chief Complaint: OBESITY Burnett KanarisShiquita is here to discuss her progress with her obesity treatment plan. She is on the Category 2 plan and is following her eating plan approximately 33 % of the time. She states she is exercising at the gym 30-60 minutes 2 times per week. Ronetta reports that she struggled with the plan over the holidays. She is ready to get back on track.  Her weight is 212 lb (96.2 kg) today and she has gained  5 lbs since her last visit. She has lost 6 lbs since starting treatment with us.  Vitamin D deficiency Anael has a diagnosis of vitamin D deficiency. She is currently taking OTC Vit D and denies nausea, vomiting or muscle weakness.  ASSESSMENT AND PLAN:  Vitamin D deficiency  Class 1 obesity with serious comorbidity and body mass index (BMI) of 31.0 to 31.9 in adult, unspecified obesity type  PLAN:  Vitamin D Deficiency Madelynn was informed that low vitamin D levels contributes to fatigue and are associated with obesity, breast, and colon cancer. She agrees to continue to take OTC Vit D 5000 IU daily and will follow up for routine testing of vitamin D, at least 2-3 times per year. She was informed of the risk of over-replacement of vitamin D and agrees to not increase her dose unless she discusses this with us first. Lutricia agrees to follow up with our clinic in 2 weeks.  Obesity Jackalynn is currently in the action stage of change. As such, her goal is to continue with weight loss efforts She has agreed to follow the Category 2 plan Seila has been instructed to work up to a goal of 150 minutes of combined cardio and strengthening exercise per week for weight loss and overall health benefits. We discussed the following Behavioral Modification Strategies today: decrease eating out and work on meal planning and cooking strategies  I spent > than 50% of the 15 minute visit on counseling as documented in  the note.  Burnett KanarisShiquita has agreed to follow up with our clinic in 2 weeks. She was informed of the importance of frequent follow up visits to maximize her success with intensive lifestyle modifications for her multiple health conditions.  ALLERGIES: Allergies  Allergen Reactions  . Other Hives  . Sulfa Antibiotics Hives    MEDICATIONS: Current Outpatient Medications on File Prior to Visit  Medication Sig Dispense Refill  . Biotin 1 MG CAPS Take 1 mg by mouth daily.    . Cholecalciferol (VITAMIN D3) 5000 units TABS Take by mouth daily.    [name]. co-enzyme Q-10 30 MG capsule Take 30 mg by mouth daily.    . ferrous sulfate 325 (65 FE) MG tablet Take 325 mg by mouth daily.    . Lactobacillus (PROBIOTIC ACIDOPHILUS PO) Take 1 capsule by mouth daily.    [name]. letrozole (FEMARA) 2.5 MG tablet Take 2.5 mg by mouth daily. Take one every 3 days    . Melatonin 1 MG CAPS Take 1 capsule by mouth at bedtime.    . Multiple Vitamin (MULTIVITAMIN) tablet Take by mouth.     No current facility-administered medications on file prior to visit.     PAST MEDICAL HISTORY: Past Medical History:  Diagnosis Date  . Anemia   . Anxiety   . Arthritis   . Constipation   . Joint pain   . Kidney problem   . Knee pain   . Palpitations   .  UTI (urinary tract infection)   . Vitamin D deficiency     PAST SURGICAL HISTORY: Past Surgical History:  Procedure Laterality Date  . INCISE AND DRAIN ABCESS     1994    SOCIAL HISTORY: Social History   Tobacco Use  . Smoking status: Never Smoker  . Smokeless tobacco: Never Used  Substance Use Topics  . Alcohol use: Yes    Comment: occasional  . Drug use: No    FAMILY HISTORY: Family History  Problem Relation Age of Onset  . Renal cancer Maternal Grandmother 70  . Alcohol abuse Maternal Grandmother   . Hyperlipidemia Maternal Grandmother   . Hypertension Maternal Grandmother   . Arthritis Father   . Obesity Father   . Alcohol abuse Maternal Uncle   .  Mental illness Maternal Uncle   . Mental illness Paternal Aunt   . Diabetes Paternal Aunt   . Hyperlipidemia Maternal Grandfather   . Hypertension Maternal Grandfather   . Stroke Paternal Grandmother   . Diabetes Paternal Grandmother   . Cancer Paternal Grandfather        lung  . Colon cancer Neg Hx   . Breast cancer Neg Hx     ROS: Review of Systems  Constitutional: Negative for weight loss.  Gastrointestinal: Negative for nausea and vomiting.  Musculoskeletal:       Negative for muscle weakness    PHYSICAL EXAM: Blood pressure 113/73, pulse 60, temperature 97.9 F (36.6 C), temperature source Oral, height 5\' 9"  (1.753 m), weight 212 lb (96.2 kg), SpO2 99 %. Body mass index is 31.31 kg/m. Physical Exam Vitals signs reviewed.  Constitutional:      Appearance: Normal appearance. She is obese.  Cardiovascular:     Rate and Rhythm: Normal rate.     Pulses: Normal pulses.  Pulmonary:     Effort: Pulmonary effort is normal.  Musculoskeletal: Normal range of motion.  Skin:    General: Skin is warm and dry.  Neurological:     Mental Status: She is alert and oriented to person, place, and time.  Psychiatric:        Mood and Affect: Mood normal.        Behavior: Behavior normal.     RECENT LABS AND TESTS: BMET    Component Value Date/Time   NA 140 02/04/2018 1019   K 4.5 02/04/2018 1019   CL 102 02/04/2018 1019   CO2 26 02/04/2018 1019   GLUCOSE 90 02/04/2018 1019   GLUCOSE 90 08/31/2017 0929   BUN 12 02/04/2018 1019   CREATININE 0.84 02/04/2018 1019   CALCIUM 9.2 02/04/2018 1019   GFRNONAA 91 02/04/2018 1019   GFRAA 105 02/04/2018 1019   Lab Results  Component Value Date   HGBA1C 5.4 02/04/2018   HGBA1C 5.5 10/09/2017   HGBA1C 5.4 08/31/2017   HGBA1C 5.6 08/28/2016   Lab Results  Component Value Date   INSULIN 6.9 02/04/2018   INSULIN 5.8 10/09/2017   CBC    Component Value Date/Time   WBC 5.7 10/09/2017 0910   WBC 5.2 08/31/2017 0929   RBC 4.43  10/09/2017 0910   RBC 4.17 08/31/2017 0929   HGB 11.8 10/09/2017 0910   HCT 36.0 10/09/2017 0910   PLT 198.0 08/31/2017 0929   MCV 81 10/09/2017 0910   MCH 26.6 10/09/2017 0910   MCHC 32.8 10/09/2017 0910   MCHC 33.2 08/31/2017 0929   RDW 13.9 10/09/2017 0910   LYMPHSABS 1.5 10/09/2017 0910   MONOABS 0.5 08/31/2017  0929   EOSABS 0.1 10/09/2017 0910   BASOSABS 0.0 10/09/2017 0910   Iron/TIBC/Ferritin/ %Sat No results found for: IRON, TIBC, FERRITIN, IRONPCTSAT Lipid Panel     Component Value Date/Time   CHOL 157 02/04/2018 1019   TRIG 32 02/04/2018 1019   HDL 60 02/04/2018 1019   CHOLHDL 3 08/31/2017 0929   VLDL 7.6 08/31/2017 0929   LDLCALC 91 02/04/2018 1019   Hepatic Function Panel     Component Value Date/Time   PROT 6.7 02/04/2018 1019   ALBUMIN 4.2 02/04/2018 1019   AST 14 02/04/2018 1019   ALT 9 02/04/2018 1019   ALKPHOS 35 (L) 02/04/2018 1019   BILITOT 0.3 02/04/2018 1019      Component Value Date/Time   TSH 0.89 08/31/2017 0929   TSH 0.85 08/28/2016 1402    Ref. Range 02/04/2018 10:19  Vitamin D, 25-Hydroxy Latest Ref Range: 30.0 - 100.0 ng/mL 69.5     OBESITY BEHAVIORAL INTERVENTION VISIT  Today's visit was # 13   Starting weight: 218 lbs Starting date: 10/09/2017 Today's weight : 212 lbs Today's date: 05/14/2018 Total lbs lost to date: 6.  ASK: We discussed the diagnosis of obesity with Jyotsna Norvell today and Letti agreed to give [place] permission to discuss obesity behavioral modification therapy today.  ASSESS: Niger has the diagnosis of obesity and her BMI today is 31.29 Ladrea is in the action stage of change   ADVISE: Constanza was educated on the multiple health risks of obesity as well as the benefit of weight loss to improve her health. She was advised of the need for long term treatment and the importance of lifestyle modifications to improve her current health and to decrease her risk of future health problems.  AGREE: Multiple  dietary modification options and treatment options were discussed and  Kimanh agreed to follow the recommendations documented in the above note.  ARRANGE: Dennis was educated on the importance of frequent visits to treat obesity as outlined per CMS and USPSTF guidelines and agreed to schedule her next follow up appointment today.  I, Tammy Wysor, am acting as Energy manager for [employer] I, Alois Cliche, PA-C have reviewed above note and agree with its content

## 2018-05-28 ENCOUNTER — Ambulatory Visit (INDEPENDENT_AMBULATORY_CARE_PROVIDER_SITE_OTHER): Payer: Self-pay | Admitting: Physician Assistant

## 2018-05-28 ENCOUNTER — Encounter (INDEPENDENT_AMBULATORY_CARE_PROVIDER_SITE_OTHER): Payer: Self-pay

## 2018-06-06 ENCOUNTER — Encounter (INDEPENDENT_AMBULATORY_CARE_PROVIDER_SITE_OTHER): Payer: Self-pay | Admitting: Physician Assistant

## 2018-06-06 ENCOUNTER — Ambulatory Visit (INDEPENDENT_AMBULATORY_CARE_PROVIDER_SITE_OTHER): Payer: 59 | Admitting: Physician Assistant

## 2018-06-06 VITALS — BP 123/73 | HR 63 | Temp 98.1°F | Ht 69.0 in | Wt 211.0 lb

## 2018-06-06 DIAGNOSIS — E559 Vitamin D deficiency, unspecified: Secondary | ICD-10-CM

## 2018-06-06 DIAGNOSIS — Z6831 Body mass index (BMI) 31.0-31.9, adult: Secondary | ICD-10-CM | POA: Diagnosis not present

## 2018-06-06 DIAGNOSIS — E669 Obesity, unspecified: Secondary | ICD-10-CM | POA: Diagnosis not present

## 2018-06-10 NOTE — Progress Notes (Signed)
Office: [phone]  /  Fax: [phone]   HPI:   Chief Complaint: OBESITY Jamie Bean is here to discuss her progress with her obesity treatment plan. She is on the Category 2 plan and is following her eating plan approximately 75 % of the time. She states she is zumba 40-60 minutes 3-4 times per week. Jamie Bean did well with weight loss. She reports that previously she did well with journaling and would like to try it for the next couple of weeks. Her weight is 211 lb (95.7 kg) today and has had a weight loss of 1 pounds over a period of 3 weeks since her last visit. She has lost 7 lbs since starting treatment with [place].  Vitamin D deficiency Jamie Bean has a diagnosis of vitamin D deficiency. She is currently taking OTC Vit D and denies nausea, vomiting or muscle weakness.   ASSESSMENT AND PLAN:  Vitamin D deficiency  Class 1 obesity with serious comorbidity and body mass index (BMI) of 31.0 to 31.9 in adult, unspecified obesity type  PLAN:  Vitamin D Deficiency Jamie Bean was informed that low vitamin D levels contributes to fatigue and are associated with obesity, breast, and colon cancer. She agrees to continue to take OTC Vit D @50 ,000 IU daily and will follow up for routine testing of vitamin D, at least 2-3 times per year. She was informed of the risk of over-replacement of vitamin D and agrees to not increase her dose unless she discusses this with [place] first. Jamie Bean agrees to follow up with our clinic in 3 weeks.  I spent > than 50% of the 15 minute visit on counseling as documented in the note.   Obesity Jamie Bean is currently in the action stage of change. As such, her goal is to continue with weight loss efforts She has agreed to change to keep a food journal with 1200 calories and 85 grams of protein daily Jamie Bean has been instructed to work up to a goal of 150 minutes of combined cardio and strengthening exercise per week for weight loss and overall health benefits. We  discussed the following Behavioral Modification Strategies today: work on meal planning and easy cooking plans and ways to avoid boredom eating  Jamie Bean has agreed to follow up with our clinic in 3 weeks. She was informed of the importance of frequent follow up visits to maximize her success with intensive lifestyle modifications for her multiple health conditions.  ALLERGIES: Allergies  Allergen Reactions  . Other Hives  . Sulfa Antibiotics Hives    MEDICATIONS: Current Outpatient Medications on File Prior to Visit  Medication Sig Dispense Refill  . Biotin 1 MG CAPS Take 1 mg by mouth daily.    . Cholecalciferol (VITAMIN D3) 5000 units TABS Take by mouth daily.    [name] co-enzyme Q-10 30 MG capsule Take 30 mg by mouth daily.    . ferrous sulfate 325 (65 FE) MG tablet Take 325 mg by mouth daily.    . Lactobacillus (PROBIOTIC ACIDOPHILUS PO) Take 1 capsule by mouth daily.    [name] letrozole (FEMARA) 2.5 MG tablet Take 2.5 mg by mouth daily. Take one every 3 days    . Melatonin 1 MG CAPS Take 1 capsule by mouth at bedtime.    . Multiple Vitamin (MULTIVITAMIN) tablet Take by mouth.     No current facility-administered medications on file prior to visit.     PAST MEDICAL HISTORY: Past Medical History:  Diagnosis Date  . Anemia   . Anxiety   .  Arthritis   . Constipation   . Joint pain   . Kidney problem   . Knee pain   . Palpitations   . UTI (urinary tract infection)   . Vitamin D deficiency     PAST SURGICAL HISTORY: Past Surgical History:  Procedure Laterality Date  . INCISE AND DRAIN ABCESS     1994    SOCIAL HISTORY: Social History   Tobacco Use  . Smoking status: Never Smoker  . Smokeless tobacco: Never Used  Substance Use Topics  . Alcohol use: Yes    Comment: occasional  . Drug use: No    FAMILY HISTORY: Family History  Problem Relation Age of Onset  . Renal cancer Maternal Grandmother 70  . Alcohol abuse Maternal Grandmother   . Hyperlipidemia Maternal  Grandmother   . Hypertension Maternal Grandmother   . Arthritis Father   . Obesity Father   . Alcohol abuse Maternal Uncle   . Mental illness Maternal Uncle   . Mental illness Paternal Aunt   . Diabetes Paternal Aunt   . Hyperlipidemia Maternal Grandfather   . Hypertension Maternal Grandfather   . Stroke Paternal Grandmother   . Diabetes Paternal Grandmother   . Cancer Paternal Grandfather        lung  . Colon cancer Neg Hx   . Breast cancer Neg Hx     ROS: Review of Systems  Constitutional: Positive for weight loss.  Gastrointestinal: Negative for nausea and vomiting.  Musculoskeletal:       Negative for muscle weakness    PHYSICAL EXAM: Blood pressure 123/73, pulse 63, temperature 98.1 F (36.7 C), temperature source Oral, height 5\' 9"  (1.753 m), weight 211 lb (95.7 kg), SpO2 99 %. Body mass index is 31.16 kg/m. Physical Exam Vitals signs reviewed.  Constitutional:      Appearance: Normal appearance. She is obese.  Cardiovascular:     Rate and Rhythm: Normal rate.     Pulses: Normal pulses.  Pulmonary:     Effort: Pulmonary effort is normal.  Musculoskeletal: Normal range of motion.  Skin:    General: Skin is warm and dry.  Neurological:     Mental Status: She is alert and oriented to person, place, and time.  Psychiatric:        Mood and Affect: Mood normal.        Behavior: Behavior normal.     RECENT LABS AND TESTS: BMET    Component Value Date/Time   NA 140 02/04/2018 1019   K 4.5 02/04/2018 1019   CL 102 02/04/2018 1019   CO2 26 02/04/2018 1019   GLUCOSE 90 02/04/2018 1019   GLUCOSE 90 08/31/2017 0929   BUN 12 02/04/2018 1019   CREATININE 0.84 02/04/2018 1019   CALCIUM 9.2 02/04/2018 1019   GFRNONAA 91 02/04/2018 1019   GFRAA 105 02/04/2018 1019   Lab Results  Component Value Date   HGBA1C 5.4 02/04/2018   HGBA1C 5.5 10/09/2017   HGBA1C 5.4 08/31/2017   HGBA1C 5.6 08/28/2016   Lab Results  Component Value Date   INSULIN 6.9 02/04/2018    INSULIN 5.8 10/09/2017   CBC    Component Value Date/Time   WBC 5.7 10/09/2017 0910   WBC 5.2 08/31/2017 0929   RBC 4.43 10/09/2017 0910   RBC 4.17 08/31/2017 0929   HGB 11.8 10/09/2017 0910   HCT 36.0 10/09/2017 0910   PLT 198.0 08/31/2017 0929   MCV 81 10/09/2017 0910   MCH 26.6 10/09/2017 0910  MCHC 32.8 10/09/2017 0910   MCHC 33.2 08/31/2017 0929   RDW 13.9 10/09/2017 0910   LYMPHSABS 1.5 10/09/2017 0910   MONOABS 0.5 08/31/2017 0929   EOSABS 0.1 10/09/2017 0910   BASOSABS 0.0 10/09/2017 0910   Iron/TIBC/Ferritin/ %Sat No results found for: IRON, TIBC, FERRITIN, IRONPCTSAT Lipid Panel     Component Value Date/Time   CHOL 157 02/04/2018 1019   TRIG 32 02/04/2018 1019   HDL 60 02/04/2018 1019   CHOLHDL 3 08/31/2017 0929   VLDL 7.6 08/31/2017 0929   LDLCALC 91 02/04/2018 1019   Hepatic Function Panel     Component Value Date/Time   PROT 6.7 02/04/2018 1019   ALBUMIN 4.2 02/04/2018 1019   AST 14 02/04/2018 1019   ALT 9 02/04/2018 1019   ALKPHOS 35 (L) 02/04/2018 1019   BILITOT 0.3 02/04/2018 1019      Component Value Date/Time   TSH 0.89 08/31/2017 0929   TSH 0.85 08/28/2016 1402     Ref. Range 02/04/2018 10:19  Vitamin D, 25-Hydroxy Latest Ref Range: 30.0 - 100.0 ng/mL 69.5     OBESITY BEHAVIORAL INTERVENTION VISIT  Today's visit was # 14   Starting weight: 218 lbs Starting date: 10/09/2017 Today's weight :: 211 lbs Today's date: 06/06/2018 Total lbs lost to date: 7   ASK: We discussed the diagnosis of obesity with Suzi RootsShiquita Ladnier today and Jnaya agreed to give us permission to discuss obesity behavioral modification therapy today.  ASSESS: Burnett KanarisShiquita has the diagnosis of obesity and her BMI today is 31.15 Dorla is in the action stage of change   ADVISE: Burnett KanarisShiquita was educated on the multiple health risks of obesity as well as the benefit of weight loss to improve her health. She was advised of the need for long term treatment and the  importance of lifestyle modifications to improve her current health and to decrease her risk of future health problems.  AGREE: Multiple dietary modification options and treatment options were discussed and  Nakeda agreed to follow the recommendations documented in the above note.  ARRANGE: Burnett KanarisShiquita was educated on the importance of frequent visits to treat obesity as outlined per CMS and USPSTF guidelines and agreed to schedule her next follow up appointment today.  I, Tammy Wysor, am acting as Energy managertranscriptionist for [employer] Timo Hartwig PA-C I, Alois Clicheracey Karene Bracken, PA-C have reviewed above note and agree with its content

## 2018-07-01 ENCOUNTER — Encounter (INDEPENDENT_AMBULATORY_CARE_PROVIDER_SITE_OTHER): Payer: Self-pay | Admitting: Physician Assistant

## 2018-07-01 ENCOUNTER — Ambulatory Visit (INDEPENDENT_AMBULATORY_CARE_PROVIDER_SITE_OTHER): Payer: 59 | Admitting: Physician Assistant

## 2018-07-01 VITALS — BP 117/71 | HR 59 | Temp 98.4°F | Ht 69.0 in | Wt 210.0 lb

## 2018-07-01 DIAGNOSIS — E669 Obesity, unspecified: Secondary | ICD-10-CM | POA: Diagnosis not present

## 2018-07-01 DIAGNOSIS — Z6831 Body mass index (BMI) 31.0-31.9, adult: Secondary | ICD-10-CM

## 2018-07-01 DIAGNOSIS — E8881 Metabolic syndrome: Secondary | ICD-10-CM | POA: Diagnosis not present

## 2018-07-01 DIAGNOSIS — E559 Vitamin D deficiency, unspecified: Secondary | ICD-10-CM

## 2018-07-01 DIAGNOSIS — Z9189 Other specified personal risk factors, not elsewhere classified: Secondary | ICD-10-CM | POA: Diagnosis not present

## 2018-07-01 NOTE — Progress Notes (Signed)
Office: [phone]  /  Fax: [phone]   HPI:   Chief Complaint: OBESITY Jamie Bean is here to discuss her progress with her obesity treatment plan. She is keeping a food journal with 1200 calories and 85 grams of protein  and is following her eating plan approximately 60% of the time. She states she is doing Zumba 45 minutes 2 times per week. Jamie Bean did well with weight loss. She reports that she has been overeating her calories, eating around 1400-1600 calories daily, when she is supposed to be eating 1200 calories daily. Her weight is 210 lb (95.3 kg) today and has had a weight loss of 1 pound over a period of 4 weeks since her last visit. She has lost 8 lbs since starting treatment with [place].  Vitamin D deficiency Jamie Bean has a diagnosis of Vitamin D deficiency. She is currently not taking Vit D.  At risk for osteopenia and osteoporosis Jamie Bean is at higher risk of osteopenia and osteoporosis due to Vitamin D deficiency.   Insulin Resistance Jamie Bean has a diagnosis of insulin resistance based on her elevated fasting insulin level >5. Although Jamie Bean blood glucose readings are still under good control, insulin resistance puts her at greater risk of metabolic syndrome and diabetes. She is not taking medication currently and continues to work on diet and exercise to decrease risk of diabetes. She denies polyphagia.  ASSESSMENT AND PLAN:  Vitamin D deficiency - Plan: VITAMIN D 25 Hydroxy (Vit-D Deficiency, Fractures)  Insulin resistance - Plan: Comprehensive metabolic panel, Hemoglobin A1c, Insulin, random  At risk for osteoporosis  Class 1 obesity with serious comorbidity and body mass index (BMI) of 31.0 to 31.9 in adult, unspecified obesity type  PLAN:  Vitamin D Deficiency Jamie Bean was informed that low Vitamin D levels contributes to fatigue and are associated with obesity, breast, and colon cancer. She will have labs drawn today and agrees to follow-up with our  clinic in 3 weeks.  At risk for osteopenia and osteoporosis Jamie Bean was given extended  (15 minutes) osteoporosis prevention counseling today. Jamie Bean is at risk for osteopenia and osteoporsis due to her Vitamin D deficiency. She was encouraged to take her Vitamin D and follow her higher calcium diet and increase strengthening exercise to help strengthen her bones and decrease her risk of osteopenia and osteoporosis.  Insulin Resistance Jamie Bean will continue to work on weight loss, exercise, and decreasing simple carbohydrates in her diet to help decrease the risk of diabetes. We dicussed metformin including benefits and risks. She was informed that eating too many simple carbohydrates or too many calories at one sitting increases the likelihood of GI side effects. Jamie Bean is not on medication. She will have labs drawn today and agrees to follow-up with our clinic in 3 weeks.  Obesity Jamie Bean is currently in the action stage of change. As such, her goal is to continue with weight loss efforts. She has agreed to keep a food journal with 1200 calories and 85 grams protein daily. Jamie Bean has been instructed to work up to a goal of 150 minutes of combined cardio and strengthening exercise per week for weight loss and overall health benefits. We discussed the following Behavioral Modification Strategies today: work on meal planning and easy cooking plans and planning for success.  Jamie Bean has agreed to follow-up with our clinic in 3 weeks. She was informed of the importance of frequent follow up visits to maximize her success with intensive lifestyle modifications for her multiple health conditions.  ALLERGIES: Allergies  Allergen Reactions  . Other Hives  . Sulfa Antibiotics Hives    MEDICATIONS: Current Outpatient Medications on File Prior to Visit  Medication Sig Dispense Refill  . Biotin 1 MG CAPS Take 1 mg by mouth daily.    . Cholecalciferol (VITAMIN D3) 5000 units TABS Take by  mouth daily.    [name] co-enzyme Q-10 30 MG capsule Take 30 mg by mouth daily.    . ferrous sulfate 325 (65 FE) MG tablet Take 325 mg by mouth daily.    . Lactobacillus (PROBIOTIC ACIDOPHILUS PO) Take 1 capsule by mouth daily.    [name] letrozole (FEMARA) 2.5 MG tablet Take 2.5 mg by mouth daily. Take one every 3 days    . Melatonin 1 MG CAPS Take 1 capsule by mouth at bedtime.    . Multiple Vitamin (MULTIVITAMIN) tablet Take by mouth.     No current facility-administered medications on file prior to visit.     PAST MEDICAL HISTORY: Past Medical History:  Diagnosis Date  . Anemia   . Anxiety   . Arthritis   . Constipation   . Joint pain   . Kidney problem   . Knee pain   . Palpitations   . UTI (urinary tract infection)   . Vitamin D deficiency     PAST SURGICAL HISTORY: Past Surgical History:  Procedure Laterality Date  . INCISE AND DRAIN ABCESS     1994    SOCIAL HISTORY: Social History   Tobacco Use  . Smoking status: Never Smoker  . Smokeless tobacco: Never Used  Substance Use Topics  . Alcohol use: Yes    Comment: occasional  . Drug use: No    FAMILY HISTORY: Family History  Problem Relation Age of Onset  . Renal cancer Maternal Grandmother 70  . Alcohol abuse Maternal Grandmother   . Hyperlipidemia Maternal Grandmother   . Hypertension Maternal Grandmother   . Arthritis Father   . Obesity Father   . Alcohol abuse Maternal Uncle   . Mental illness Maternal Uncle   . Mental illness Paternal Aunt   . Diabetes Paternal Aunt   . Hyperlipidemia Maternal Grandfather   . Hypertension Maternal Grandfather   . Stroke Paternal Grandmother   . Diabetes Paternal Grandmother   . Cancer Paternal Grandfather        lung  . Colon cancer Neg Hx   . Breast cancer Neg Hx    ROS: Review of Systems  Constitutional: Positive for weight loss.  Endo/Heme/Allergies:       Negative for polyphagia. Negative for hypoglycemia.   PHYSICAL EXAM: Blood pressure 117/71, pulse (!)  59, temperature 98.4 F (36.9 C), temperature source Oral, height 5\' 9"  (1.753 m), weight 210 lb (95.3 kg), SpO2 99 %. Body mass index is 31.01 kg/m. Physical Exam Vitals signs reviewed.  Constitutional:      Appearance: Normal appearance. She is obese.  Cardiovascular:     Rate and Rhythm: Normal rate.     Pulses: Normal pulses.  Pulmonary:     Effort: Pulmonary effort is normal.     Breath sounds: Normal breath sounds.  Musculoskeletal: Normal range of motion.  Skin:    General: Skin is warm and dry.  Neurological:     Mental Status: She is alert and oriented to person, place, and time.  Psychiatric:        Behavior: Behavior normal.   RECENT LABS AND TESTS: BMET    Component Value Date/Time   NA 140 02/04/2018 1019  K 4.5 02/04/2018 1019   CL 102 02/04/2018 1019   CO2 26 02/04/2018 1019   GLUCOSE 90 02/04/2018 1019   GLUCOSE 90 08/31/2017 0929   BUN 12 02/04/2018 1019   CREATININE 0.84 02/04/2018 1019   CALCIUM 9.2 02/04/2018 1019   GFRNONAA 91 02/04/2018 1019   GFRAA 105 02/04/2018 1019   Lab Results  Component Value Date   HGBA1C 5.4 02/04/2018   HGBA1C 5.5 10/09/2017   HGBA1C 5.4 08/31/2017   HGBA1C 5.6 08/28/2016   Lab Results  Component Value Date   INSULIN 6.9 02/04/2018   INSULIN 5.8 10/09/2017   CBC    Component Value Date/Time   WBC 5.7 10/09/2017 0910   WBC 5.2 08/31/2017 0929   RBC 4.43 10/09/2017 0910   RBC 4.17 08/31/2017 0929   HGB 11.8 10/09/2017 0910   HCT 36.0 10/09/2017 0910   PLT 198.0 08/31/2017 0929   MCV 81 10/09/2017 0910   MCH 26.6 10/09/2017 0910   MCHC 32.8 10/09/2017 0910   MCHC 33.2 08/31/2017 0929   RDW 13.9 10/09/2017 0910   LYMPHSABS 1.5 10/09/2017 0910   MONOABS 0.5 08/31/2017 0929   EOSABS 0.1 10/09/2017 0910   BASOSABS 0.0 10/09/2017 0910   Iron/TIBC/Ferritin/ %Sat No results found for: IRON, TIBC, FERRITIN, IRONPCTSAT Lipid Panel     Component Value Date/Time   CHOL 157 02/04/2018 1019   TRIG 32  02/04/2018 1019   HDL 60 02/04/2018 1019   CHOLHDL 3 08/31/2017 0929   VLDL 7.6 08/31/2017 0929   LDLCALC 91 02/04/2018 1019   Hepatic Function Panel     Component Value Date/Time   PROT 6.7 02/04/2018 1019   ALBUMIN 4.2 02/04/2018 1019   AST 14 02/04/2018 1019   ALT 9 02/04/2018 1019   ALKPHOS 35 (L) 02/04/2018 1019   BILITOT 0.3 02/04/2018 1019      Component Value Date/Time   TSH 0.89 08/31/2017 0929   TSH 0.85 08/28/2016 1402    Ref. Range 02/04/2018 10:19  Vitamin D, 25-Hydroxy Latest Ref Range: 30.0 - 100.0 ng/mL 69.5   OBESITY BEHAVIORAL INTERVENTION VISIT  Today's visit was #15  Starting weight: 218 lbs Starting date: 10/09/2017 Today's weight: 210 lbs Today's date: 07/01/2018 Total lbs lost to date: 8   07/01/2018  Height 5\' 9"  (1.753 m)  Weight 210 lb (95.3 kg)  BMI (Calculated) 31  BLOOD PRESSURE - SYSTOLIC 117  BLOOD PRESSURE - DIASTOLIC 71   Body Fat % 35.6 %  Total Body Water (lbs) 87.8 lbs   ASK: We discussed the diagnosis of obesity with Jamie Bean today and Jamie Bean agreed to give Jamie Bean permission to discuss obesity behavioral modification therapy today.  ASSESS: Jamie Bean has the diagnosis of obesity and her BMI today is 31. Jamie Bean is in the action stage of change.   ADVISE: Jamie Bean was educated on the multiple health risks of obesity as well as the benefit of weight loss to improve her health. She was advised of the need for long term treatment and the importance of lifestyle modifications to improve her current health and to decrease her risk of future health problems.  AGREE: Multiple dietary modification options and treatment options were discussed and  Danaisha agreed to follow the recommendations documented in the above note.  ARRANGE: Jamie Bean was educated on the importance of frequent visits to treat obesity as outlined per CMS and USPSTF guidelines and agreed to schedule her next follow up appointment today.  Jamie Bean, Denise Haag, am  acting as Energy managertranscriptionist  for Alois Cliche, PA-C I, Alois Cliche, PA-C have reviewed above note and agree with its content

## 2018-07-02 LAB — HEMOGLOBIN A1C
Est. average glucose Bld gHb Est-mCnc: 103 mg/dL
Hgb A1c MFr Bld: 5.2 % (ref 4.8–5.6)

## 2018-07-02 LAB — COMPREHENSIVE METABOLIC PANEL
A/G RATIO: 1.6 (ref 1.2–2.2)
ALBUMIN: 4.4 g/dL (ref 3.8–4.8)
ALK PHOS: 37 IU/L — AB (ref 39–117)
ALT: 11 IU/L (ref 0–32)
AST: 13 IU/L (ref 0–40)
BILIRUBIN TOTAL: 0.2 mg/dL (ref 0.0–1.2)
BUN / CREAT RATIO: 21 (ref 9–23)
BUN: 18 mg/dL (ref 6–20)
CHLORIDE: 102 mmol/L (ref 96–106)
CO2: 22 mmol/L (ref 20–29)
Calcium: 9.7 mg/dL (ref 8.7–10.2)
Creatinine, Ser: 0.86 mg/dL (ref 0.57–1.00)
GFR calc non Af Amer: 88 mL/min/{1.73_m2} (ref >59)
GFR, EST AFRICAN AMERICAN: 102 mL/min/{1.73_m2} (ref >59)
Globulin, Total: 2.7 g/dL (ref 1.5–4.5)
Glucose: 87 mg/dL (ref 65–99)
POTASSIUM: 4.8 mmol/L (ref 3.5–5.2)
Sodium: 137 mmol/L (ref 134–144)
TOTAL PROTEIN: 7.1 g/dL (ref 6.0–8.5)

## 2018-07-02 LAB — INSULIN, RANDOM: INSULIN: 7.3 u[IU]/mL (ref 2.6–24.9)

## 2018-07-02 LAB — VITAMIN D 25 HYDROXY (VIT D DEFICIENCY, FRACTURES): Vit D, 25-Hydroxy: 58.8 ng/mL (ref 30.0–100.0)

## 2018-07-21 ENCOUNTER — Encounter (INDEPENDENT_AMBULATORY_CARE_PROVIDER_SITE_OTHER): Payer: Self-pay | Admitting: Physician Assistant

## 2018-07-22 ENCOUNTER — Ambulatory Visit (INDEPENDENT_AMBULATORY_CARE_PROVIDER_SITE_OTHER): Payer: Self-pay | Admitting: Physician Assistant

## 2018-07-22 ENCOUNTER — Encounter (INDEPENDENT_AMBULATORY_CARE_PROVIDER_SITE_OTHER): Payer: Self-pay

## 2018-08-01 ENCOUNTER — Encounter (INDEPENDENT_AMBULATORY_CARE_PROVIDER_SITE_OTHER): Payer: Self-pay

## 2019-08-21 ENCOUNTER — Other Ambulatory Visit: Payer: 59

## 2019-09-04 ENCOUNTER — Ambulatory Visit (INDEPENDENT_AMBULATORY_CARE_PROVIDER_SITE_OTHER): Payer: 59 | Admitting: Family Medicine

## 2019-09-05 ENCOUNTER — Other Ambulatory Visit: Payer: Self-pay

## 2019-09-05 ENCOUNTER — Ambulatory Visit (INDEPENDENT_AMBULATORY_CARE_PROVIDER_SITE_OTHER): Payer: 59 | Admitting: Family

## 2019-09-05 ENCOUNTER — Encounter: Payer: Self-pay | Admitting: Family

## 2019-09-05 VITALS — BP 116/78 | HR 77 | Temp 97.7°F | Ht 69.0 in | Wt 231.8 lb

## 2019-09-05 DIAGNOSIS — Z0000 Encounter for general adult medical examination without abnormal findings: Secondary | ICD-10-CM

## 2019-09-05 NOTE — Patient Instructions (Signed)
Essentials for good sleep:   #1 Exercise #2 Limit Caffeine ( no caffeine after lunch) #3 No smart phones, TV prior to bed -- BLUE light is VERY activating and send the brain an 'awake message.'  #4 Go to bed at same time of night each night and get up at same time of day.   Nice to see you!   Health Maintenance, Female Adopting a healthy lifestyle and getting preventive care are important in promoting health and wellness. Ask your health care provider about:  The right schedule for you to have regular tests and exams.  Things you can do on your own to prevent diseases and keep yourself healthy. What should I know about diet, weight, and exercise? Eat a healthy diet   Eat a diet that includes plenty of vegetables, fruits, low-fat dairy products, and lean protein.  Do not eat a lot of foods that are high in solid fats, added sugars, or sodium. Maintain a healthy weight Body mass index (BMI) is used to identify weight problems. It estimates body fat based on height and weight. Your health care provider can help determine your BMI and help you achieve or maintain a healthy weight. Get regular exercise Get regular exercise. This is one of the most important things you can do for your health. Most adults should:  Exercise for at least 150 minutes each week. The exercise should increase your heart rate and make you sweat (moderate-intensity exercise).  Do strengthening exercises at least twice a week. This is in addition to the moderate-intensity exercise.  Spend less time sitting. Even light physical activity can be beneficial. Watch cholesterol and blood lipids Have your blood tested for lipids and cholesterol at 36 years of age, then have this test every 5 years. Have your cholesterol levels checked more often if:  Your lipid or cholesterol levels are high.  You are older than 36 years of age.  You are at high risk for heart disease. What should I know about cancer  screening? Depending on your health history and family history, you may need to have cancer screening at various ages. This may include screening for:  Breast cancer.  Cervical cancer.  Colorectal cancer.  Skin cancer.  Lung cancer. What should I know about heart disease, diabetes, and high blood pressure? Blood pressure and heart disease  High blood pressure causes heart disease and increases the risk of stroke. This is more likely to develop in people who have high blood pressure readings, are of African descent, or are overweight.  Have your blood pressure checked: ? Every 3-5 years if you are 76-76 years of age. ? Every year if you are 76 years old or older. Diabetes Have regular diabetes screenings. This checks your fasting blood sugar level. Have the screening done:  Once every three years after age 40 if you are at a normal weight and have a low risk for diabetes.  More often and at a younger age if you are overweight or have a high risk for diabetes. What should I know about preventing infection? Hepatitis B If you have a higher risk for hepatitis B, you should be screened for this virus. Talk with your health care provider to find out if you are at risk for hepatitis B infection. Hepatitis C Testing is recommended for:  Everyone born from [birth year] through 1967.  Anyone with known risk factors for hepatitis C. Sexually transmitted infections (STIs)  Get screened for STIs, including gonorrhea and chlamydia, if: ?  You are sexually active and are younger than 36 years of age. ? You are older than 36 years of age and your health care provider tells you that you are at risk for this type of infection. ? Your sexual activity has changed since you were last screened, and you are at increased risk for chlamydia or gonorrhea. Ask your health care provider if you are at risk.  Ask your health care provider about whether you are at high risk for HIV. Your health care provider may  recommend a prescription medicine to help prevent HIV infection. If you choose to take medicine to prevent HIV, you should first get tested for HIV. You should then be tested every 3 months for as long as you are taking the medicine. Pregnancy  If you are about to stop having your period (premenopausal) and you may become pregnant, seek counseling before you get pregnant.  Take 400 to 800 micrograms (mcg) of folic acid every day if you become pregnant.  Ask for birth control (contraception) if you want to prevent pregnancy. Osteoporosis and menopause Osteoporosis is a disease in which the bones lose minerals and strength with aging. This can result in bone fractures. If you are 66 years old or older, or if you are at risk for osteoporosis and fractures, ask your health care provider if you should:  Be screened for bone loss.  Take a calcium or vitamin D supplement to lower your risk of fractures.  Be given hormone replacement therapy (HRT) to treat symptoms of menopause. Follow these instructions at home: Lifestyle  Do not use any products that contain nicotine or tobacco, such as cigarettes, e-cigarettes, and chewing tobacco. If you need help quitting, ask your health care provider.  Do not use street drugs.  Do not share needles.  Ask your health care provider for help if you need support or information about quitting drugs. Alcohol use  Do not drink alcohol if: ? Your health care provider tells you not to drink. ? You are pregnant, may be pregnant, or are planning to become pregnant.  If you drink alcohol: ? Limit how much you use to 0-1 drink a day. ? Limit intake if you are breastfeeding.  Be aware of how much alcohol is in your drink. In the [place] one drink equals one 12 oz bottle of beer (355 mL), one 5 oz glass of wine (148 mL), or one 1 oz glass of hard liquor (44 mL). General instructions  Schedule regular health, dental, and eye exams.  Stay current with your  vaccines.  Tell your health care provider if: ? You often feel depressed. ? You have ever been abused or do not feel safe at home. Summary  Adopting a healthy lifestyle and getting preventive care are important in promoting health and wellness.  Follow your health care provider's instructions about healthy diet, exercising, and getting tested or screened for diseases.  Follow your health care provider's instructions on monitoring your cholesterol and blood pressure. This information is not intended to replace advice given to you by your health care provider. Make sure you discuss any questions you have with your health care provider. Document Revised: 04/17/2018 Document Reviewed: 04/17/2018 Elsevier Patient Education  2020 [employer].

## 2019-09-05 NOTE — Assessment & Plan Note (Signed)
Clinical breast and performed today.  Deferred pelvic exam in the absence of complaints and Pap smear is up-to-date.  Discussed sleep hygiene.

## 2019-09-05 NOTE — Progress Notes (Signed)
Subjective:    Patient ID: Jamie Bean, female    DOB: 01/04/1986, 34 y.o.   MRN: [id number]  CC: Jamie Bean is a 34 y.o. female who presents today for physical exam.    HPI: Overall doing well Planning pregnancy  Some trouble with falling asleep. No depression. Thinks about work and stressors at work which can be contributory to not being able to wind down.  No si/hi    Colorectal Cancer Screening: No early family history Breast Cancer Screening: No early family history Cervical Cancer Screening: Follows with GYN in [place] , [place]. Last pap normal        Tetanus - utd      Labs: Screening labs today. Exercise: Gets regular exercise, weightlifting.  Alcohol use: occassional Smoking/tobacco use: Nonsmoker.    HISTORY:  Past Medical History:  Diagnosis Date  . Anemia   . Anxiety   . Arthritis   . Constipation   . Joint pain   . Kidney problem   . Knee pain   . Palpitations   . UTI (urinary tract infection)   . Vitamin D deficiency     Past Surgical History:  Procedure Laterality Date  . INCISE AND DRAIN ABCESS     1994   Family History  Problem Relation Age of Onset  . Renal cancer Maternal Grandmother 70  . Alcohol abuse Maternal Grandmother   . Hyperlipidemia Maternal Grandmother   . Hypertension Maternal Grandmother   . Arthritis Father   . Obesity Father   . Alcohol abuse Maternal Uncle   . Mental illness Maternal Uncle   . Mental illness Paternal Aunt   . Diabetes Paternal Aunt   . Hyperlipidemia Maternal Grandfather   . Hypertension Maternal Grandfather   . Stroke Paternal Grandmother   . Diabetes Paternal Grandmother   . Cancer Paternal Grandfather        lung  . Colon cancer Neg Hx   . Breast cancer Neg Hx       ALLERGIES: Other and Sulfa antibiotics  Current Outpatient Medications on File Prior to Visit  Medication Sig Dispense Refill  . Biotin 1 MG CAPS Take 1 mg by mouth daily.    . Cholecalciferol (VITAMIN D3) 5000 units TABS  Take by mouth daily.    [name] co-enzyme Q-10 30 MG capsule Take 30 mg by mouth daily.    . ferrous sulfate 325 (65 FE) MG tablet Take 325 mg by mouth daily.    . Lactobacillus (PROBIOTIC ACIDOPHILUS PO) Take 1 capsule by mouth daily.    . Melatonin 1 MG CAPS Take 1 capsule by mouth at bedtime.    . Multiple Vitamin (MULTIVITAMIN) tablet Take by mouth.     No current facility-administered medications on file prior to visit.    Social History   Tobacco Use  . Smoking status: Never Smoker  . Smokeless tobacco: Never Used  Substance Use Topics  . Alcohol use: Yes    Comment: occasional  . Drug use: No    Review of Systems  Constitutional: Negative for chills, fever and unexpected weight change.  HENT: Negative for congestion.   Respiratory: Negative for cough.   Cardiovascular: Negative for chest pain, palpitations and leg swelling.  Gastrointestinal: Negative for nausea and vomiting.  Musculoskeletal: Negative for arthralgias and myalgias.  Skin: Negative for rash.  Neurological: Negative for headaches.  Hematological: Negative for adenopathy.  Psychiatric/Behavioral: Positive for sleep disturbance. Negative for confusion and suicidal ideas. The patient is not nervous/anxious.  Objective:    BP 116/78   Pulse 77   Temp 97.7 F (36.5 C) (Temporal)   Ht 5\' 9"  (1.753 m)   Wt 231 lb 12.8 oz (105.1 kg)   SpO2 97%   BMI 34.23 kg/m   BP Readings from Last 3 Encounters:  09/05/19 116/78  07/01/18 117/71  06/06/18 123/73   Wt Readings from Last 3 Encounters:  09/05/19 231 lb 12.8 oz (105.1 kg)  07/01/18 210 lb (95.3 kg)  06/06/18 211 lb (95.7 kg)    Physical Exam Vitals reviewed.  Constitutional:      Appearance: She is well-developed.  Eyes:     Conjunctiva/sclera: Conjunctivae normal.  Neck:     Thyroid: No thyroid mass or thyromegaly.  Cardiovascular:     Rate and Rhythm: Normal rate and regular rhythm.     Pulses: Normal pulses.     Heart sounds: Normal  heart sounds.  Pulmonary:     Effort: Pulmonary effort is normal.     Breath sounds: Normal breath sounds. No wheezing, rhonchi or rales.  Chest:     Breasts: Breasts are symmetrical.        Right: No inverted nipple, mass, nipple discharge, skin change or tenderness.        Left: No inverted nipple, mass, nipple discharge, skin change or tenderness.  Lymphadenopathy:     Head:     Right side of head: No submental, submandibular, tonsillar, preauricular, posterior auricular or occipital adenopathy.     Left side of head: No submental, submandibular, tonsillar, preauricular, posterior auricular or occipital adenopathy.     Cervical: No cervical adenopathy.     Right cervical: No superficial, deep or posterior cervical adenopathy.    Left cervical: No superficial, deep or posterior cervical adenopathy.  Skin:    General: Skin is warm and dry.  Neurological:     Mental Status: She is alert.  Psychiatric:        Speech: Speech normal.        Behavior: Behavior normal.        Thought Content: Thought content normal.        Assessment & Plan:   Problem List Items Addressed This Visit      Other   Routine physical examination - Primary    Clinical breast and performed today.  Deferred pelvic exam in the absence of complaints and Pap smear is up-to-date.  Discussed sleep hygiene.      Relevant Orders   CBC with Differential/Platelet   Comprehensive metabolic panel   Hemoglobin A1c   Lipid panel   TSH   VITAMIN D 25 Hydroxy (Vit-D Deficiency, Fractures)       I have discontinued Jamie Bean's letrozole. I am also having her maintain her Lactobacillus (PROBIOTIC ACIDOPHILUS PO), Biotin, ferrous sulfate, multivitamin, Vitamin D3, co-enzyme Q-10, and Melatonin.   No orders of the defined types were placed in this encounter.   Return precautions given.   Risks, benefits, and alternatives of the medications and treatment plan prescribed today were discussed, and patient  expressed understanding.   Education regarding symptom management and diagnosis given to patient on AVS.   Continue to follow with 06/08/18, FNP for routine health maintenance.   Allegra Grana and I agreed with plan.   Suzi Roots, FNP

## 2019-09-06 LAB — COMPREHENSIVE METABOLIC PANEL
AG Ratio: 1.6 (calc) (ref 1.0–2.5)
ALT: 11 U/L (ref 6–29)
AST: 17 U/L (ref 10–30)
Albumin: 4.2 g/dL (ref 3.6–5.1)
Alkaline phosphatase (APISO): 35 U/L (ref 31–125)
BUN: 15 mg/dL (ref 7–25)
CO2: 20 mmol/L (ref 20–32)
Calcium: 9.3 mg/dL (ref 8.6–10.2)
Chloride: 105 mmol/L (ref 98–110)
Creat: 0.77 mg/dL (ref 0.50–1.10)
Globulin: 2.6 g/dL (calc) (ref 1.9–3.7)
Glucose, Bld: 91 mg/dL (ref 65–99)
Potassium: 4 mmol/L (ref 3.5–5.3)
Sodium: 137 mmol/L (ref 135–146)
Total Bilirubin: 0.4 mg/dL (ref 0.2–1.2)
Total Protein: 6.8 g/dL (ref 6.1–8.1)

## 2019-09-06 LAB — CBC WITH DIFFERENTIAL/PLATELET
Absolute Monocytes: 690 cells/uL (ref 200–950)
Basophils Absolute: 41 cells/uL (ref 0–200)
Basophils Relative: 0.6 %
Eosinophils Absolute: 117 cells/uL (ref 15–500)
Eosinophils Relative: 1.7 %
HCT: 38.8 % (ref 35.0–45.0)
Hemoglobin: 12.2 g/dL (ref 11.7–15.5)
Lymphs Abs: 1801 cells/uL (ref 850–3900)
MCH: 26.6 pg — ABNORMAL LOW (ref 27.0–33.0)
MCHC: 31.4 g/dL — ABNORMAL LOW (ref 32.0–36.0)
MCV: 84.7 fL (ref 80.0–100.0)
MPV: 12.2 fL (ref 7.5–12.5)
Monocytes Relative: 10 %
Neutro Abs: 4250 cells/uL (ref 1500–7800)
Neutrophils Relative %: 61.6 %
Platelets: 117 10*3/uL — ABNORMAL LOW (ref 140–400)
RBC: 4.58 10*6/uL (ref 3.80–5.10)
RDW: 12.7 % (ref 11.0–15.0)
Total Lymphocyte: 26.1 %
WBC: 6.9 10*3/uL (ref 3.8–10.8)

## 2019-09-06 LAB — LIPID PANEL
Cholesterol: 192 mg/dL (ref <200)
HDL: 59 mg/dL (ref >=50)
LDL Cholesterol (Calc): 118 mg/dL (calc) — ABNORMAL HIGH
Non-HDL Cholesterol (Calc): 133 mg/dL (calc) — ABNORMAL HIGH (ref <130)
Total CHOL/HDL Ratio: 3.3 (calc) (ref <5.0)
Triglycerides: 61 mg/dL (ref <150)

## 2019-09-06 LAB — HEMOGLOBIN A1C
Hgb A1c MFr Bld: 5 % of total Hgb (ref <5.7)
Mean Plasma Glucose: 97 (calc)
eAG (mmol/L): 5.4 (calc)

## 2019-09-06 LAB — VITAMIN D 25 HYDROXY (VIT D DEFICIENCY, FRACTURES): Vit D, 25-Hydroxy: 29 ng/mL — ABNORMAL LOW (ref 30–100)

## 2019-09-06 LAB — TSH: TSH: 1.1 mIU/L

## 2019-09-08 ENCOUNTER — Other Ambulatory Visit: Payer: Self-pay | Admitting: Family

## 2019-09-08 DIAGNOSIS — R899 Unspecified abnormal finding in specimens from other organs, systems and tissues: Secondary | ICD-10-CM

## 2019-09-09 ENCOUNTER — Other Ambulatory Visit: Payer: Self-pay

## 2019-09-09 ENCOUNTER — Encounter (INDEPENDENT_AMBULATORY_CARE_PROVIDER_SITE_OTHER): Payer: Self-pay | Admitting: Family Medicine

## 2019-09-09 ENCOUNTER — Ambulatory Visit (INDEPENDENT_AMBULATORY_CARE_PROVIDER_SITE_OTHER): Payer: 59 | Admitting: Family Medicine

## 2019-09-09 VITALS — BP 113/73 | HR 68 | Temp 98.0°F | Ht 69.0 in | Wt 229.0 lb

## 2019-09-09 DIAGNOSIS — E559 Vitamin D deficiency, unspecified: Secondary | ICD-10-CM

## 2019-09-09 DIAGNOSIS — Z6833 Body mass index (BMI) 33.0-33.9, adult: Secondary | ICD-10-CM

## 2019-09-09 DIAGNOSIS — E66811 Obesity, class 1: Secondary | ICD-10-CM

## 2019-09-09 DIAGNOSIS — Z0289 Encounter for other administrative examinations: Secondary | ICD-10-CM

## 2019-09-09 DIAGNOSIS — R5383 Other fatigue: Secondary | ICD-10-CM

## 2019-09-09 DIAGNOSIS — Z9189 Other specified personal risk factors, not elsewhere classified: Secondary | ICD-10-CM

## 2019-09-09 DIAGNOSIS — E7849 Other hyperlipidemia: Secondary | ICD-10-CM

## 2019-09-09 DIAGNOSIS — Z1331 Encounter for screening for depression: Secondary | ICD-10-CM | POA: Diagnosis not present

## 2019-09-09 DIAGNOSIS — E669 Obesity, unspecified: Secondary | ICD-10-CM

## 2019-09-09 MED ORDER — VITAMIN D (ERGOCALCIFEROL) 1.25 MG (50000 UNIT) PO CAPS: 50000.0000 [IU] | ORAL_CAPSULE | ORAL | 0 refills | Status: DC

## 2019-09-09 NOTE — Progress Notes (Signed)
Chief Complaint:   OBESITY Jamie Bean (MR# [id number]) is a 34 y.o. female who presents for evaluation and treatment of obesity and related comorbidities. Current BMI is Body mass index is 33.82 kg/m. Jamie Bean has been struggling with her weight for many years and has been unsuccessful in either losing weight, maintaining weight loss, or reaching her healthy weight goal.  Jamie Bean is currently in the action stage of change and ready to dedicate time achieving and maintaining a healthier weight. Jamie Bean is interested in becoming our patient and working on intensive lifestyle modifications including (but not limited to) diet and exercise for weight loss.  Jamie Bean's habits were reviewed today and are as follows: Her family eats meals together, she thinks her family will eat healthier with her, her desired weight loss is 40 lbs, she has been heavy most of her life, she started gaining weight at the age of 84, her heaviest weight ever was 238 pounds, she has significant food cravings issues, she skips meals frequently, she is frequently drinking liquids with calories, she frequently makes poor food choices, she frequently eats larger portions than normal and she struggles with emotional eating.  Depression Screen Jamie Bean's Food and Mood (modified PHQ-9) score was 6.  Depression screen PHQ 2/9 09/09/2019  Decreased Interest 1  Down, Depressed, Hopeless 1  PHQ - 2 Score 2  Altered sleeping 1  Tired, decreased energy 1  Change in appetite 1  Feeling bad or failure about yourself  1  Trouble concentrating 0  Moving slowly or fidgety/restless 0  Suicidal thoughts 0  PHQ-9 Score 6  Difficult doing work/chores Not difficult at all   Subjective:   1. Other fatigue Kaelynne admits to daytime somnolence and admits to waking up still tired. Patent has a history of symptoms of daytime fatigue. Jamie Bean generally gets 5 or 6 hours of sleep per night, and states that she has nightime  awakenings. Snoring is not present. Apneic episodes are not present. Epworth Sleepiness Score is 8.  2. Vitamin D deficiency Jamie Bean is currently taking OTC prenatal multivitamins q daily. Her last Vit D level on 09/05/2019 was 29.  3. Other hyperlipidemia Bonna's last lipid panel, showed a total cholesterol of 192, HDL 59, and LDL 118, and triglycerides 61.  4. At risk for osteoporosis Jamie Bean is at higher risk of osteopenia and osteoporosis due to Vitamin D deficiency.   Assessment/Plan:   1. Other fatigue Jamie Bean does feel that her weight is causing her energy to be lower than it should be. Fatigue may be related to obesity, depression or many other causes. Labs will be ordered, and in the meanwhile, Jamie Bean will focus on self care including making healthy food choices, increasing physical activity and focusing on stress reduction.  - EKG 12-Lead  2. Vitamin D deficiency Low Vitamin D level contributes to fatigue and are associated with obesity, breast, and colon cancer. Jamie Bean agreed to start prescription Vitamin D 50,000 IU every week with no refills. She will follow-up for routine testing of Vitamin D, at least 2-3 times per year to avoid over-replacement. We will recheck labs in 3 months.  - Vitamin D, Ergocalciferol, (DRISDOL) 1.25 MG (50000 UNIT) CAPS capsule; Take 1 capsule (50,000 Units total) by mouth every 7 (seven) days.  Dispense: 4 capsule; Refill: 0  3. Other hyperlipidemia Cardiovascular risk and specific lipid/LDL goals reviewed. We discussed several lifestyle modifications today and Jamie Bean will continue to work on diet, exercise and weight loss efforts. We will recheck  labs in 3 months. Orders and follow up as documented in patient record.   4. Depression screening Jamie Bean had a positive depression screening. Depression is commonly associated with obesity and often results in emotional eating behaviors. We will monitor this closely and work on CBT to help  improve the non-hunger eating patterns. Referral to Psychology may be required if no improvement is seen as she continues in our clinic.  5. At risk for osteoporosis Jamie Bean was given approximately 15 minutes of  counseling today. Jamie Bean is at risk for osteopenia and osteoporosis due to her Vitamin D deficiency. She was encouraged to take her Vitamin D and follow her higher calcium diet and increase strengthening exercise to help strengthen her bones and decrease her risk of osteopenia and osteoporosis.  Repetitive spaced learning was employed today to elicit superior memory formation and behavioral change.  6. Class 1 obesity with serious comorbidity and body mass index (BMI) of 33.0 to 33.9 in adult, unspecified obesity type Jamie Bean is currently in the action stage of change and her goal is to continue with weight loss efforts. I recommend Jamie Bean begin the structured treatment plan as follows:  She has agreed to the Category 2 Plan + 100 snack calories.  Exercise goals: As is, (knee friendly Youtube videos and dancing).   Behavioral modification strategies: increasing lean protein intake, decreasing simple carbohydrates, no skipping meals and meal planning and cooking strategies.  She was informed of the importance of frequent follow-up visits to maximize her success with intensive lifestyle modifications for her multiple health conditions. She was informed we would discuss her lab results at her next visit unless there is a critical issue that needs to be addressed sooner. Jamie Bean agreed to keep her next visit at the agreed upon time to discuss these results.  Objective:   Blood pressure 113/73, pulse 68, temperature 98 F (36.7 C), temperature source Oral, height 5\' 9"  (1.753 m), weight 229 lb (103.9 kg), last menstrual period 08/17/2019, SpO2 99 %. Body mass index is 33.82 kg/m.  EKG: Normal sinus rhythm, rate 57 BPM.  Indirect Calorimeter completed today shows a VO2 of 279 and  a REE of 1944.  Her calculated basal metabolic rate is 10/17/2019 thus her basal metabolic rate is better than expected.  General: Cooperative, alert, well developed, in no acute distress. HEENT: Conjunctivae and lids unremarkable. Cardiovascular: Regular rhythm.  Lungs: Normal work of breathing. Neurologic: No focal deficits.   Lab Results  Component Value Date   CREATININE 0.77 09/05/2019   BUN 15 09/05/2019   NA 137 09/05/2019   K 4.0 09/05/2019   CL 105 09/05/2019   CO2 20 09/05/2019   Lab Results  Component Value Date   ALT 11 09/05/2019   AST 17 09/05/2019   ALKPHOS 37 (L) 07/01/2018   BILITOT 0.4 09/05/2019   Lab Results  Component Value Date   HGBA1C 5.0 09/05/2019   HGBA1C 5.2 07/01/2018   HGBA1C 5.4 02/04/2018   HGBA1C 5.5 10/09/2017   HGBA1C 5.4 08/31/2017   Lab Results  Component Value Date   INSULIN 7.3 07/01/2018   INSULIN 6.9 02/04/2018   INSULIN 5.8 10/09/2017   Lab Results  Component Value Date   TSH 1.10 09/05/2019   Lab Results  Component Value Date   CHOL 192 09/05/2019   HDL 59 09/05/2019   LDLCALC 118 (H) 09/05/2019   TRIG 61 09/05/2019   CHOLHDL 3.3 09/05/2019   Lab Results  Component Value Date   WBC 6.9 09/05/2019  HGB 12.2 09/05/2019   HCT 38.8 09/05/2019   MCV 84.7 09/05/2019   PLT 117 (L) 09/05/2019   No results found for: IRON, TIBC, FERRITIN  Attestation Statements:   Reviewed by clinician on day of visit: allergies, medications, problem list, medical history, surgical history, family history, social history, and previous encounter notes.   I, Trixie Dredge, am acting as transcriptionist for Dennard Nip, MD.  I have reviewed the above documentation for accuracy and completeness, and I agree with the above. - Dennard Nip, MD

## 2019-09-23 ENCOUNTER — Encounter (INDEPENDENT_AMBULATORY_CARE_PROVIDER_SITE_OTHER): Payer: Self-pay | Admitting: Family Medicine

## 2019-09-23 ENCOUNTER — Other Ambulatory Visit: Payer: Self-pay

## 2019-09-23 ENCOUNTER — Ambulatory Visit (INDEPENDENT_AMBULATORY_CARE_PROVIDER_SITE_OTHER): Payer: 59 | Admitting: Family Medicine

## 2019-09-23 VITALS — BP 112/73 | HR 65 | Temp 98.6°F | Ht 69.0 in | Wt 228.0 lb

## 2019-09-23 DIAGNOSIS — Z6833 Body mass index (BMI) 33.0-33.9, adult: Secondary | ICD-10-CM

## 2019-09-23 DIAGNOSIS — E559 Vitamin D deficiency, unspecified: Secondary | ICD-10-CM | POA: Diagnosis not present

## 2019-09-23 DIAGNOSIS — E669 Obesity, unspecified: Secondary | ICD-10-CM

## 2019-09-23 DIAGNOSIS — E7849 Other hyperlipidemia: Secondary | ICD-10-CM | POA: Diagnosis not present

## 2019-09-23 NOTE — Progress Notes (Signed)
Chief Complaint:   OBESITY Jamie Bean is here to discuss her progress with her obesity treatment plan along with follow-up of her obesity related diagnoses. Jamie Bean is on the Category 2 Plan + 100 snack calories and states she is following her eating plan approximately 80% of the time. Jamie Bean states she is doing strength training, cardio, and dancing for 20-40 minutes 3-4 times per week.  Today's visit was #: 69 Starting weight: 218 lbs Starting date: 10/09/2017 Today's weight: 228 lbs Today's date: 09/23/2019 Total lbs lost to date: 0 Total lbs lost since last in-office visit: 1  Interim History: Jamie Bean felt that dinner was the meal that posed the most challenge, due to fatigue at the end of a long day, and not feeling like cooking. She denies excessive hunger levels over the last two weeks.  Subjective:   1. Vitamin D deficiency Jamie Bean's Vit D level on 09/05/2019 was 29. She has yet to pick up her prescription Vit D.  2. Other hyperlipidemia Jamie Bean's lipid panel on 09/05/2019, showed a total cholesterol of 192, triglycerides 61, HDL 59, and LDL 118. She is not on statin, and is using lifestyle changes to normalize lipids.  Assessment/Plan:   1. Vitamin D deficiency Low Vitamin D level contributes to fatigue and are associated with obesity, breast, and colon cancer. Jamie Bean agreed to continue taking prescription Vitamin D 50,000 IU every week, no refill needed today. She will follow-up for routine testing of Vitamin D, at least 2-3 times per year to avoid over-replacement.  2. Other hyperlipidemia Cardiovascular risk and specific lipid/LDL goals reviewed. We discussed several lifestyle modifications today. Jamie Bean will continue Category 2 meal plan, and will continue to work on diet, exercise and weight loss efforts. We will recheck labs every 3 months. Orders and follow up as documented in patient record.   3. Class 1 obesity with serious comorbidity and body mass index  (BMI) of 33.0 to 33.9 in adult, unspecified obesity type Jamie Bean is currently in the action stage of change. As such, her goal is to continue with weight loss efforts. She has agreed to the Category 2 Plan + 100 snack calories and keeping a food journal and adhering to recommended goals of 400-550 calories and 40+ grams of protein at supper daily.   Exercise goals: As is.  Behavioral modification strategies: increasing lean protein intake, decreasing simple carbohydrates, no skipping meals, meal planning and cooking strategies and keeping a strict food journal.  Jamie Bean has agreed to follow-up with our clinic in 3 weeks with Jamie Potash, PA-C. She was informed of the importance of frequent follow-up visits to maximize her success with intensive lifestyle modifications for her multiple health conditions.   Objective:   Blood pressure 112/73, pulse 65, temperature 98.6 F (37 C), temperature source Oral, height 5\' 9"  (1.753 m), weight 228 lb (103.4 kg), last menstrual period 09/15/2019, SpO2 99 %. Body mass index is 33.67 kg/m.  General: Cooperative, alert, well developed, in no acute distress. HEENT: Conjunctivae and lids unremarkable. Cardiovascular: Regular rhythm.  Lungs: Normal work of breathing. Neurologic: No focal deficits.   Lab Results  Component Value Date   CREATININE 0.77 09/05/2019   BUN 15 09/05/2019   NA 137 09/05/2019   K 4.0 09/05/2019   CL 105 09/05/2019   CO2 20 09/05/2019   Lab Results  Component Value Date   ALT 11 09/05/2019   AST 17 09/05/2019   ALKPHOS 37 (L) 07/01/2018   BILITOT 0.4 09/05/2019   Lab  Results  Component Value Date   HGBA1C 5.0 09/05/2019   HGBA1C 5.2 07/01/2018   HGBA1C 5.4 02/04/2018   HGBA1C 5.5 10/09/2017   HGBA1C 5.4 08/31/2017   Lab Results  Component Value Date   INSULIN 7.3 07/01/2018   INSULIN 6.9 02/04/2018   INSULIN 5.8 10/09/2017   Lab Results  Component Value Date   TSH 1.10 09/05/2019   Lab Results    Component Value Date   CHOL 192 09/05/2019   HDL 59 09/05/2019   LDLCALC 118 (H) 09/05/2019   TRIG 61 09/05/2019   CHOLHDL 3.3 09/05/2019   Lab Results  Component Value Date   WBC 6.9 09/05/2019   HGB 12.2 09/05/2019   HCT 38.8 09/05/2019   MCV 84.7 09/05/2019   PLT 117 (L) 09/05/2019   No results found for: IRON, TIBC, FERRITIN  Attestation Statements:   Reviewed by clinician on day of visit: allergies, medications, problem list, medical history, surgical history, family history, social history, and previous encounter notes.  Time spent on visit including pre-visit chart review and post-visit care and charting was 27 minutes.    I, Burt Knack, am acting as transcriptionist for Quillian Quince, MD.  I have reviewed the above documentation for accuracy and completeness, and I agree with the above. -  Quillian Quince, MD

## 2019-10-13 ENCOUNTER — Ambulatory Visit (INDEPENDENT_AMBULATORY_CARE_PROVIDER_SITE_OTHER): Payer: 59 | Admitting: Physician Assistant

## 2019-10-13 ENCOUNTER — Other Ambulatory Visit: Payer: Self-pay

## 2019-10-13 ENCOUNTER — Encounter (INDEPENDENT_AMBULATORY_CARE_PROVIDER_SITE_OTHER): Payer: Self-pay | Admitting: Physician Assistant

## 2019-10-13 VITALS — BP 120/85 | HR 65 | Temp 98.6°F | Ht 69.0 in | Wt 231.0 lb

## 2019-10-13 DIAGNOSIS — Z6834 Body mass index (BMI) 34.0-34.9, adult: Secondary | ICD-10-CM | POA: Diagnosis not present

## 2019-10-13 DIAGNOSIS — E669 Obesity, unspecified: Secondary | ICD-10-CM

## 2019-10-13 DIAGNOSIS — M25569 Pain in unspecified knee: Secondary | ICD-10-CM | POA: Diagnosis not present

## 2019-10-13 DIAGNOSIS — E559 Vitamin D deficiency, unspecified: Secondary | ICD-10-CM

## 2019-10-14 NOTE — Progress Notes (Signed)
Chief Complaint:   OBESITY Jamie Bean is here to discuss her progress with her obesity treatment plan along with follow-up of her obesity related diagnoses. Jamie Bean is on the Category 2 Plan + 100 calories and states she is following her eating plan approximately 50% of the time. Jamie Bean states she is weightlifting/Zumba 20-45 minutes 4 times per week.  Today's visit was #: 18 Starting weight: 210 lbs Starting date: 10/09/2017 Today's weight: 231 lbs Today's date: 10/13/2019 Total lbs lost to date: 0 Total lbs lost since last in-office visit: 0  Interim History: Jamie Bean states that she just returned from [place] where she ate and drank whatever she wanted. She is eating out 3 times a week.  Subjective:   Knee pain, unspecified chronicity, unspecified laterality. Jamie Bean has had pain for many years and reports worse swelling recently. She states Ibuprofen helps with pain.  Vitamin D deficiency. Jamie Bean is on Vitamin D supplementation. No nausea, vomiting, or muscle weakness. Last Vitamin D 29 on 09/05/2019.  Assessment/Plan:   Knee pain, unspecified chronicity, unspecified laterality.  Ambulatory referral to Sports Medicine with Dr. Antoine Primas.  Vitamin D deficiency. Low Vitamin D level contributes to fatigue and are associated with obesity, breast, and colon cancer. She agrees to continue to take Vitamin D and will follow-up for routine testing of Vitamin D, at least 2-3 times per year to avoid over-replacement.  Class 1 obesity with serious comorbidity and body mass index (BMI) of 34.0 to 34.9 in adult, unspecified obesity type.  Jamie Bean is currently in the action stage of change. As such, her goal is to continue with weight loss efforts. She has agreed to the Category 2 Plan + 100 calories and will journal 350-450 calories and 35 grams of protein at supper.   Exercise goals: For substantial health benefits, adults should do at least 150 minutes (2 hours and 30  minutes) a week of moderate-intensity, or 75 minutes (1 hour and 15 minutes) a week of vigorous-intensity aerobic physical activity, or an equivalent combination of moderate- and vigorous-intensity aerobic activity. Aerobic activity should be performed in episodes of at least 10 minutes, and preferably, it should be spread throughout the week.  Behavioral modification strategies: meal planning and cooking strategies and keeping healthy foods in the home.  Jamie Bean has agreed to follow-up with our clinic in 2 weeks. She was informed of the importance of frequent follow-up visits to maximize her success with intensive lifestyle modifications for her multiple health conditions.   Objective:   Blood pressure 120/85, pulse 65, temperature 98.6 F (37 C), temperature source Oral, height 5\' 9"  (1.753 m), weight 231 lb (104.8 kg), last menstrual period 09/15/2019, SpO2 99 %. Body mass index is 34.11 kg/m.  General: Cooperative, alert, well developed, in no acute distress. HEENT: Conjunctivae and lids unremarkable. Cardiovascular: Regular rhythm.  Lungs: Normal work of breathing. Neurologic: No focal deficits.   Lab Results  Component Value Date   CREATININE 0.77 09/05/2019   BUN 15 09/05/2019   NA 137 09/05/2019   K 4.0 09/05/2019   CL 105 09/05/2019   CO2 20 09/05/2019   Lab Results  Component Value Date   ALT 11 09/05/2019   AST 17 09/05/2019   ALKPHOS 37 (L) 07/01/2018   BILITOT 0.4 09/05/2019   Lab Results  Component Value Date   HGBA1C 5.0 09/05/2019   HGBA1C 5.2 07/01/2018   HGBA1C 5.4 02/04/2018   HGBA1C 5.5 10/09/2017   HGBA1C 5.4 08/31/2017   Lab Results  Component Value Date   INSULIN 7.3 07/01/2018   INSULIN 6.9 02/04/2018   INSULIN 5.8 10/09/2017   Lab Results  Component Value Date   TSH 1.10 09/05/2019   Lab Results  Component Value Date   CHOL 192 09/05/2019   HDL 59 09/05/2019   LDLCALC 118 (H) 09/05/2019   TRIG 61 09/05/2019   CHOLHDL 3.3 09/05/2019     Lab Results  Component Value Date   WBC 6.9 09/05/2019   HGB 12.2 09/05/2019   HCT 38.8 09/05/2019   MCV 84.7 09/05/2019   PLT 117 (L) 09/05/2019   No results found for: IRON, TIBC, FERRITIN  Attestation Statements:   Reviewed by clinician on day of visit: allergies, medications, problem list, medical history, surgical history, family history, social history, and previous encounter notes.  Time spent on visit including pre-visit chart review and post-visit charting and care was 30 minutes.   IMichaelene Song, am acting as transcriptionist for Abby Potash, PA-C   I have reviewed the above documentation for accuracy and completeness, and I agree with the above. Abby Potash, PA-C

## 2019-10-28 ENCOUNTER — Other Ambulatory Visit: Payer: Self-pay

## 2019-10-28 ENCOUNTER — Ambulatory Visit (INDEPENDENT_AMBULATORY_CARE_PROVIDER_SITE_OTHER): Payer: 59 | Admitting: Physician Assistant

## 2019-10-28 VITALS — BP 108/72 | HR 78 | Temp 98.5°F | Ht 69.0 in | Wt 230.0 lb

## 2019-10-28 DIAGNOSIS — E669 Obesity, unspecified: Secondary | ICD-10-CM

## 2019-10-28 DIAGNOSIS — E7849 Other hyperlipidemia: Secondary | ICD-10-CM

## 2019-10-28 DIAGNOSIS — E559 Vitamin D deficiency, unspecified: Secondary | ICD-10-CM | POA: Diagnosis not present

## 2019-10-28 DIAGNOSIS — Z9189 Other specified personal risk factors, not elsewhere classified: Secondary | ICD-10-CM

## 2019-10-28 DIAGNOSIS — Z6834 Body mass index (BMI) 34.0-34.9, adult: Secondary | ICD-10-CM

## 2019-10-29 MED ORDER — VITAMIN D (ERGOCALCIFEROL) 1.25 MG (50000 UNIT) PO CAPS: 50000.0000 [IU] | ORAL_CAPSULE | ORAL | 0 refills | Status: DC

## 2019-10-29 NOTE — Progress Notes (Signed)
Chief Complaint:   OBESITY Jamie Bean is here to discuss her progress with her obesity treatment plan along with follow-up of her obesity related diagnoses. Jamie Bean is on the Category 2 Plan +100 calories and keeping a food journal and adhering to recommended goals of 350-450 calories and 35 grams of protein at supper and states she is following her eating plan approximately 70% of the time. Jamie Bean states she is biking/spinning for 30-40 minutes 3-4 times per week.  Today's visit was #: 19 Starting weight: 210 lbs Starting date: 10/09/2017 Today's weight: 230 lbs Today's date: 10/28/2019 Total lbs lost to date: 0 Total lbs lost since last in-office visit: 1 lb  Interim History: Jamie Bean reports that she has been eating well but drinking alcohol often.  Subjective:   1. Vitamin D deficiency Derrisha's Vitamin D level was 29.0 on 09/05/2019. She is currently taking prescription vitamin D 50,000 IU each week. She denies nausea, vomiting or muscle weakness.  2. Other hyperlipidemia Jamie Bean has hyperlipidemia and has been trying to improve her cholesterol levels with intensive lifestyle modification including a low saturated fat diet, exercise and weight loss. She denies any chest pain, claudication or myalgias.  She is on Omega 3.  Lab Results  Component Value Date   ALT 11 09/05/2019   AST 17 09/05/2019   ALKPHOS 37 (L) 07/01/2018   BILITOT 0.4 09/05/2019   Lab Results  Component Value Date   CHOL 192 09/05/2019   HDL 59 09/05/2019   LDLCALC 118 (H) 09/05/2019   TRIG 61 09/05/2019   CHOLHDL 3.3 09/05/2019   3. At risk for osteoporosis Jamie Bean is at higher risk of osteopenia and osteoporosis due to Vitamin D deficiency.   Assessment/Plan:   1. Vitamin D deficiency Low Vitamin D level contributes to fatigue and are associated with obesity, breast, and colon cancer. She agrees to continue to take prescription Vitamin D @50 ,000 IU every week and will follow-up for routine  testing of Vitamin D, at least 2-3 times per year to avoid over-replacement. - Vitamin D, Ergocalciferol, (DRISDOL) 1.25 MG (50000 UNIT) CAPS capsule; Take 1 capsule (50,000 Units total) by mouth every 7 (seven) days.  Dispense: 4 capsule; Refill: 0  2. Other hyperlipidemia Cardiovascular risk and specific lipid/LDL goals reviewed.  We discussed several lifestyle modifications today and Jamie Bean will continue to work on diet, exercise and weight loss efforts. Orders and follow up as documented in patient record.   Counseling Intensive lifestyle modifications are the first line treatment for this issue. . Dietary changes: Increase soluble fiber. Decrease simple carbohydrates. . Exercise changes: Moderate to vigorous-intensity aerobic activity 150 minutes per week if tolerated. . Lipid-lowering medications: see documented in medical record.  3. At risk for osteoporosis Jamie Bean was given approximately 15 minutes of osteoporosis prevention counseling today. Jamie Bean is at risk for osteopenia and osteoporosis due to her Vitamin D deficiency. She was encouraged to take her Vitamin D and follow her higher calcium diet and increase strengthening exercise to help strengthen her bones and decrease her risk of osteopenia and osteoporosis.  Repetitive spaced learning was employed today to elicit superior memory formation and behavioral change.  4. Class 1 obesity with serious comorbidity and body mass index (BMI) of 34.0 to 34.9 in adult, unspecified obesity type Jamie Bean is currently in the action stage of change. As such, her goal is to continue with weight loss efforts. She has agreed to the Category 2 Plan and keeping a food journal and adhering to recommended  goals of 400-500 calories and 35 grams of protein at supper.   Exercise goals: As is.  Behavioral modification strategies: decreasing liquid calories, keeping healthy foods in the home and ways to avoid boredom eating.  Jamie Bean has agreed to  follow-up with our clinic in 2 weeks. She was informed of the importance of frequent follow-up visits to maximize her success with intensive lifestyle modifications for her multiple health conditions.   Objective:   Blood pressure 108/72, pulse 78, temperature 98.5 F (36.9 C), temperature source Oral, height 5\' 9"  (1.753 m), weight 230 lb (104.3 kg), last menstrual period 10/12/2019, SpO2 99 %. Body mass index is 33.97 kg/m.  General: Cooperative, alert, well developed, in no acute distress. HEENT: Conjunctivae and lids unremarkable. Cardiovascular: Regular rhythm.  Lungs: Normal work of breathing. Neurologic: No focal deficits.   Lab Results  Component Value Date   CREATININE 0.77 09/05/2019   BUN 15 09/05/2019   NA 137 09/05/2019   K 4.0 09/05/2019   CL 105 09/05/2019   CO2 20 09/05/2019   Lab Results  Component Value Date   ALT 11 09/05/2019   AST 17 09/05/2019   ALKPHOS 37 (L) 07/01/2018   BILITOT 0.4 09/05/2019   Lab Results  Component Value Date   HGBA1C 5.0 09/05/2019   HGBA1C 5.2 07/01/2018   HGBA1C 5.4 02/04/2018   HGBA1C 5.5 10/09/2017   HGBA1C 5.4 08/31/2017   Lab Results  Component Value Date   INSULIN 7.3 07/01/2018   INSULIN 6.9 02/04/2018   INSULIN 5.8 10/09/2017   Lab Results  Component Value Date   TSH 1.10 09/05/2019   Lab Results  Component Value Date   CHOL 192 09/05/2019   HDL 59 09/05/2019   LDLCALC 118 (H) 09/05/2019   TRIG 61 09/05/2019   CHOLHDL 3.3 09/05/2019   Lab Results  Component Value Date   WBC 6.9 09/05/2019   HGB 12.2 09/05/2019   HCT 38.8 09/05/2019   MCV 84.7 09/05/2019   PLT 117 (L) 09/05/2019   Attestation Statements:   Reviewed by clinician on day of visit: allergies, medications, problem list, medical history, surgical history, family history, social history, and previous encounter notes.  I, Water quality scientist, CMA, am acting as transcriptionist for Abby Potash, PA-C  I have reviewed the above documentation  for accuracy and completeness, and I agree with the above. Abby Potash, PA-C

## 2019-11-03 ENCOUNTER — Other Ambulatory Visit: Payer: Self-pay

## 2019-11-03 ENCOUNTER — Other Ambulatory Visit (INDEPENDENT_AMBULATORY_CARE_PROVIDER_SITE_OTHER): Payer: 59

## 2019-11-03 DIAGNOSIS — R899 Unspecified abnormal finding in specimens from other organs, systems and tissues: Secondary | ICD-10-CM

## 2019-11-03 LAB — CBC WITH DIFFERENTIAL/PLATELET
Basophils Absolute: 0 10*3/uL (ref 0.0–0.1)
Basophils Relative: 0.7 % (ref 0.0–3.0)
Eosinophils Absolute: 0.1 10*3/uL (ref 0.0–0.7)
Eosinophils Relative: 1.6 % (ref 0.0–5.0)
HCT: 36.5 % (ref 36.0–46.0)
Hemoglobin: 12.1 g/dL (ref 12.0–15.0)
Lymphocytes Relative: 24.9 % (ref 12.0–46.0)
Lymphs Abs: 1.6 10*3/uL (ref 0.7–4.0)
MCHC: 33.1 g/dL (ref 30.0–36.0)
MCV: 84.2 fl (ref 78.0–100.0)
Monocytes Absolute: 0.5 10*3/uL (ref 0.1–1.0)
Monocytes Relative: 7.9 % (ref 3.0–12.0)
Neutro Abs: 4.1 10*3/uL (ref 1.4–7.7)
Neutrophils Relative %: 64.9 % (ref 43.0–77.0)
Platelets: 176 10*3/uL (ref 150.0–400.0)
RBC: 4.34 Mil/uL (ref 3.87–5.11)
RDW: 14 % (ref 11.5–15.5)
WBC: 6.3 10*3/uL (ref 4.0–10.5)

## 2019-11-05 ENCOUNTER — Ambulatory Visit: Payer: 59 | Admitting: Family Medicine

## 2019-11-05 ENCOUNTER — Ambulatory Visit: Payer: Self-pay

## 2019-11-05 ENCOUNTER — Ambulatory Visit (INDEPENDENT_AMBULATORY_CARE_PROVIDER_SITE_OTHER): Payer: 59

## 2019-11-05 ENCOUNTER — Other Ambulatory Visit: Payer: Self-pay

## 2019-11-05 ENCOUNTER — Encounter: Payer: Self-pay | Admitting: Family Medicine

## 2019-11-05 VITALS — BP 120/90 | HR 74 | Ht 69.0 in | Wt 233.0 lb

## 2019-11-05 DIAGNOSIS — M25562 Pain in left knee: Secondary | ICD-10-CM

## 2019-11-05 DIAGNOSIS — G8929 Other chronic pain: Secondary | ICD-10-CM

## 2019-11-05 DIAGNOSIS — M25561 Pain in right knee: Secondary | ICD-10-CM | POA: Diagnosis not present

## 2019-11-05 NOTE — Progress Notes (Signed)
[facility] [address] [place] [address] Phone: [phone] Subjective:   I Jamie Bean am serving as a Neurosurgeon for Dr. Antoine Primas.  This visit occurred during the SARS-CoV-2 public health emergency.  Safety protocols were in place, including screening questions prior to the visit, additional usage of staff PPE, and extensive cleaning of exam room while observing appropriate contact time as indicated for disinfecting solutions.   I'm seeing this patient by the request  of:  Allegra Grana, FNP  CC: Bilateral knee pain  [id number]  Jamie Bean is a 34 y.o. female coming in with complaint of bilateral knee pain. Patellar tendon pain. Swelling is worse on the right and is most painful. Patient works out and states she has a constant pain.    Onset- Chronic  Location - Posterior knee, patellar tendon  Duration-  Character- Dependent on physical activity - throbbing ache Aggravating factors- squatting, up or down stairs, standing for too long, kneeling  Reliving factors-  Therapies tried- ice, CBD rub (helps with inflammation), CBD oral  Severity-8 out of 10 sometimes     Past Medical History:  Diagnosis Date  . Anemia   . Anxiety   . Arthritis   . Constipation   . Female infertility   . Joint pain   . Kidney problem   . Knee pain   . Palpitations   . UTI (urinary tract infection)   . Vitamin D deficiency    Past Surgical History:  Procedure Laterality Date  . INCISE AND DRAIN ABCESS     1994   Social History   Socioeconomic History  . Marital status: Married    Spouse name: Gala Romney  . Number of children: Not on file  . Years of education: Not on file  . Highest education level: Not on file  Occupational History  . Occupation: Warden/ranger    Comment: [employer]  Tobacco Use  . Smoking status: Never Smoker  . Smokeless tobacco: Never Used  Vaping Use  . Vaping Use: Never used  Substance and Sexual  Activity  . Alcohol use: Yes    Comment: occasional  . Drug use: No  . Sexual activity: Yes    Partners: Male  Other Topics Concern  . 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 Difficulty of Paying Living Expenses:   Food Insecurity:   . Worried About Programme researcher, broadcasting/film/video in the Last Year:   . Barista in the Last Year:   Transportation Needs:   . Freight forwarder (Medical):   [name] Lack of Transportation (Non-Medical):   Physical Activity:   . Days of Exercise per Week:   . Minutes of Exercise per Session:   Stress:   . Feeling of Stress :   Social Connections:   . Frequency of Communication with Friends and Family:   . Frequency of Social Gatherings with Friends and Family:   . Attends Religious Services:   . Active Member of Clubs or Organizations:   . Attends Banker Meetings:   [name] Marital Status:    Allergies  Allergen Reactions  . Other Hives  .  Sulfa Antibiotics Hives   Family History  Problem Relation Age of Onset  . Renal cancer Maternal Grandmother 70  . Alcohol abuse Maternal Grandmother   . Hyperlipidemia Maternal Grandmother   . Hypertension Maternal Grandmother   . Arthritis Father   . Obesity Father   . Alcohol abuse Maternal Uncle   . Mental illness Maternal Uncle   . Mental illness Paternal Aunt   . Diabetes Paternal Aunt   . Hyperlipidemia Maternal Grandfather   . Hypertension Maternal Grandfather   . Stroke Paternal Grandmother   . Diabetes Paternal Grandmother   . Cancer Paternal Grandfather        lung  . Colon cancer Neg Hx   . Breast cancer Neg Hx         Current Outpatient Medications (Hematological):  .  ferrous sulfate 325 (65 FE) MG tablet, Take 325 mg by mouth daily.  Current Outpatient Medications  (Other):  .  Biotin 78676 MCG TABS, Take 1 tablet by mouth daily.  [name]  CANNABIDIOL PO, Take 10 mcg by mouth daily. [name]  co-enzyme Q-10 30 MG capsule, Take 30 mg by mouth daily. .  melatonin 3 MG TABS tablet, Take 1 capsule by mouth at bedtime.  .  Omega-3 Fatty Acids (FISH OIL) 1000 MG CAPS, Take 1 capsule by mouth daily. [name]  OVER THE COUNTER MEDICATION,  .  Prenatal Vit-Fe Fumarate-FA (PRENATAL VITAMIN AND MINERAL PO), Take 1 tablet by mouth daily. .  Vitamin D, Ergocalciferol, (DRISDOL) 1.25 MG (50000 UNIT) CAPS capsule, Take 1 capsule (50,000 Units total) by mouth every 7 (seven) days.   Reviewed prior external information including notes and imaging from  primary care provider As well as notes that were available from care everywhere and other healthcare systems.  Past medical history, social, surgical and family history all reviewed in electronic medical record.  No pertanent information unless stated regarding to the chief complaint.   Review of Systems:  No headache, visual changes, nausea, vomiting, diarrhea, constipation, dizziness, abdominal pain, skin rash, fevers, chills, night sweats, weight loss, swollen lymph nodes, body aches, joint swelling, chest pain, shortness of breath, mood changes. POSITIVE muscle aches  Objective  Blood pressure 120/90, pulse 74, height 5\' 9"  (1.753 m), weight 233 lb (105.7 kg), last menstrual period 10/12/2019, SpO2 97 %.   General: No apparent distress alert and oriented x3 mood and affect normal, dressed appropriately.  HEENT: Pupils equal, extraocular movements intact  Respiratory: Patient's speak in full sentences and does not appear short of breath  Cardiovascular: No lower extremity edema, non tender, no erythema  Neuro: Cranial nerves II through XII are intact, neurovascularly intact in all extremities with 2+ DTRs and 2+ pulses.  Gait normal with good balance and coordination.  MSK:   Knee exam bilaterally shows the patient does have an  effusion bilaterally.  Patient does have swelling in the patellofemoral.  Popliteal fullness noted on the right side only.  Patient does have limited 10 degrees of flexion on the right.  No significant instability.  Positive patellar grind test though noted.  Limited musculoskeletal ultrasound of patient's knees bilaterally show effusion with synovitis of the knees right greater than left.  Baker's cyst on the right side.  Moderate narrowing of the patellofemoral joint right greater than left as well.  Meniscus appear to be unremarkable bilaterally.   Impression and Recommendations:     The above documentation has been reviewed and is accurate and complete 12/12/2019, DO  Note: This dictation was prepared with Dragon dictation along with smaller phrase technology. Any transcriptional errors that result from this process are unintentional.

## 2019-11-05 NOTE — Assessment & Plan Note (Signed)
Patient does have chronic pain in both knees.  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  Ultrasound does show the patient has a synovitis.  Discussed with patient about icing regimen and home exercise, which activities to doing which wants to avoid.  Trial of topical anti-inflammatories given.  Discussed over-the-counter medications.  Follow-up again in 4 to 8 weeks

## 2019-11-05 NOTE — Patient Instructions (Addendum)
Good to see you Xrays today Hold on labs right now Pennsaid 2 times a day small amount  Turmeric 500mg  daily  Tart cherry extract 1200mg  at night Ice 20 mins 2 times daily Biking for now for cardio See me again in 6-8 weeks

## 2019-11-06 ENCOUNTER — Encounter (INDEPENDENT_AMBULATORY_CARE_PROVIDER_SITE_OTHER): Payer: Self-pay

## 2019-11-07 ENCOUNTER — Encounter (INDEPENDENT_AMBULATORY_CARE_PROVIDER_SITE_OTHER): Payer: Self-pay | Admitting: Physician Assistant

## 2019-11-11 ENCOUNTER — Ambulatory Visit (INDEPENDENT_AMBULATORY_CARE_PROVIDER_SITE_OTHER): Payer: 59 | Admitting: Physician Assistant

## 2019-11-30 ENCOUNTER — Other Ambulatory Visit (INDEPENDENT_AMBULATORY_CARE_PROVIDER_SITE_OTHER): Payer: Self-pay | Admitting: Physician Assistant

## 2019-11-30 DIAGNOSIS — E559 Vitamin D deficiency, unspecified: Secondary | ICD-10-CM

## 2019-12-10 ENCOUNTER — Encounter (INDEPENDENT_AMBULATORY_CARE_PROVIDER_SITE_OTHER): Payer: Self-pay | Admitting: *Deleted

## 2019-12-11 ENCOUNTER — Encounter (INDEPENDENT_AMBULATORY_CARE_PROVIDER_SITE_OTHER): Payer: Self-pay | Admitting: Adult Health

## 2019-12-11 ENCOUNTER — Other Ambulatory Visit: Payer: Self-pay

## 2019-12-11 ENCOUNTER — Ambulatory Visit (INDEPENDENT_AMBULATORY_CARE_PROVIDER_SITE_OTHER): Payer: 59 | Admitting: Adult Health

## 2019-12-11 VITALS — BP 115/79 | HR 76 | Temp 98.0°F | Ht 69.0 in | Wt 227.0 lb

## 2019-12-11 DIAGNOSIS — E669 Obesity, unspecified: Secondary | ICD-10-CM

## 2019-12-11 DIAGNOSIS — E559 Vitamin D deficiency, unspecified: Secondary | ICD-10-CM

## 2019-12-11 DIAGNOSIS — Z6833 Body mass index (BMI) 33.0-33.9, adult: Secondary | ICD-10-CM | POA: Diagnosis not present

## 2019-12-15 DIAGNOSIS — Z6835 Body mass index (BMI) 35.0-35.9, adult: Secondary | ICD-10-CM | POA: Insufficient documentation

## 2019-12-15 DIAGNOSIS — E669 Obesity, unspecified: Secondary | ICD-10-CM | POA: Insufficient documentation

## 2019-12-15 NOTE — Progress Notes (Signed)
Chief Complaint:   OBESITY Jamie Bean is here to discuss her progress with her obesity treatment plan along with follow-up of her obesity related diagnoses. Jamie Bean is on the Category 2 Plan and journaling and states she is following her eating plan approximately 50% of the time. Jamie Bean states she is exercising 0 minutes 0 times per week.  Today's visit was #: 20 Starting weight: 210 lbs Starting date: 10/09/2017 Today's weight: 227 lbs Today's date: 12/11/2019 Total lbs lost to date: 0 Total lbs lost since last in-office visit: 3  Interim History: Jamie Bean is [redacted] weeks pregnant!! She has her first prenatal appointment next month. She has been able to follow the Category 2 meal plan for breakfast and dinner and journals for dinner. She denies excessive cravings or polyphagia.  Subjective:   Vitamin D deficiency. Due to pregnancy, we recommend changing to prenatal vitamins for replacement.   Ref. Range 09/05/2019 14:53  Vitamin D, 25-Hydroxy Latest Ref Range: 30 - 100 ng/mL 29 (L)   Assessment/Plan:   Vitamin D deficiency. Low Vitamin D level contributes to fatigue and are associated with obesity, breast, and colon cancer. She will start taking prenatal vitamins with Vitamin D as directed and will follow-up for routine testing of Vitamin D every 3 months.   Class 1 obesity with serious comorbidity and body mass index (BMI) of 33.0 to 33.9 in adult, unspecified obesity type.  Jamie Bean is currently in the action stage of change. As such, her goal is to continue with weight loss efforts. She has agreed to the Category 2 Plan and will journal 400-500 calories and 35 grams of protein at supper.   Exercise goals: No exercise has been prescribed at this time.  Behavioral modification strategies: increasing lean protein intake, meal planning and cooking strategies and planning for success.  Jamie Bean has agreed to follow-up with our clinic in 2 weeks. She was informed of the  importance of frequent follow-up visits to maximize her success with intensive lifestyle modifications for her multiple health conditions.   Objective:   Blood pressure 115/79, pulse 76, temperature 98 F (36.7 C), temperature source Oral, height 5\' 9"  (1.753 m), weight 227 lb (103 kg), SpO2 96 %. Body mass index is 33.52 kg/m.  General: Cooperative, alert, well developed, in no acute distress. HEENT: Conjunctivae and lids unremarkable. Cardiovascular: Regular rhythm.  Lungs: Normal work of breathing. Neurologic: No focal deficits.   Lab Results  Component Value Date   CREATININE 0.77 09/05/2019   BUN 15 09/05/2019   NA 137 09/05/2019   K 4.0 09/05/2019   CL 105 09/05/2019   CO2 20 09/05/2019   Lab Results  Component Value Date   ALT 11 09/05/2019   AST 17 09/05/2019   ALKPHOS 37 (L) 07/01/2018   BILITOT 0.4 09/05/2019   Lab Results  Component Value Date   HGBA1C 5.0 09/05/2019   HGBA1C 5.2 07/01/2018   HGBA1C 5.4 02/04/2018   HGBA1C 5.5 10/09/2017   HGBA1C 5.4 08/31/2017   Lab Results  Component Value Date   INSULIN 7.3 07/01/2018   INSULIN 6.9 02/04/2018   INSULIN 5.8 10/09/2017   Lab Results  Component Value Date   TSH 1.10 09/05/2019   Lab Results  Component Value Date   CHOL 192 09/05/2019   HDL 59 09/05/2019   LDLCALC 118 (H) 09/05/2019   TRIG 61 09/05/2019   CHOLHDL 3.3 09/05/2019   Lab Results  Component Value Date   WBC 6.3 11/03/2019   HGB  12.1 11/03/2019   HCT 36.5 11/03/2019   MCV 84.2 11/03/2019   PLT 176.0 11/03/2019   No results found for: IRON, TIBC, FERRITIN  Attestation Statements:   Reviewed by clinician on day of visit: allergies, medications, problem list, medical history, surgical history, family history, social history, and previous encounter notes.  Time spent on visit including pre-visit chart review and post-visit charting and care was 29 minutes.   I, Marianna Payment, am acting as Energy manager for [employer], NP-C    I have reviewed the above documentation for accuracy and completeness, and I agree with the above. -  Julaine Fusi, NP

## 2019-12-23 ENCOUNTER — Encounter: Payer: Self-pay | Admitting: Family Medicine

## 2019-12-23 ENCOUNTER — Other Ambulatory Visit: Payer: Self-pay

## 2019-12-23 ENCOUNTER — Ambulatory Visit: Payer: 59 | Admitting: Family Medicine

## 2019-12-23 VITALS — BP 118/84 | HR 85 | Ht 69.0 in | Wt 232.0 lb

## 2019-12-23 DIAGNOSIS — M25562 Pain in left knee: Secondary | ICD-10-CM

## 2019-12-23 DIAGNOSIS — M25561 Pain in right knee: Secondary | ICD-10-CM

## 2019-12-23 DIAGNOSIS — G8929 Other chronic pain: Secondary | ICD-10-CM | POA: Diagnosis not present

## 2019-12-23 NOTE — Progress Notes (Signed)
[name] [address] [place] [address] Phone: [phone] Subjective:   Jamie Bean, am serving as a scribe for Dr. Antoine Primas. This visit occurred during the SARS-CoV-2 public health emergency.  Safety protocols were in place, including screening questions prior to the visit, additional usage of staff PPE, and extensive cleaning of exam room while observing appropriate contact time as indicated for disinfecting solutions.   I'm seeing this patient by the request  of:  Allegra Grana, FNP  CC: bilateral knee pain follow up   [id number]   11/05/2019 Patient does have chronic pain in both knees.  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  Ultrasound does show the patient has a synovitis.  Discussed with patient about icing regimen and home exercise, which activities to doing which wants to avoid.  Trial of topical anti-inflammatories given.  Discussed over-the-counter medications.  Follow-up again in 4 to 8 weeks  Update 12/23/2019 Jamie Bean is a 34 y.o. female coming in with complaint of bilateral knee pain. Patient states that her pain is about the same as last visit. R>L. Pain with knee flexion. Has not been active.  Is pregnant.  [redacted] weeks pregnant     Past Medical History:  Diagnosis Date  . Anemia   . Anxiety   . Arthritis   . Constipation   . Female infertility   . Joint pain   . Kidney problem   . Knee pain   . Palpitations   . UTI (urinary tract infection)   . Vitamin D deficiency    Past Surgical History:  Procedure Laterality Date  . INCISE AND DRAIN ABCESS     1994   Social History   Socioeconomic History  . Marital status: Married    Spouse name: Gala Romney  . Number of children: Not on file  . Years of education: Not on file  . Highest education level: Not on file  Occupational History  . Occupation: Warden/ranger    Comment: [employer]  Tobacco Use  . Smoking status: Never Smoker  . Smokeless tobacco:  Never Used  Vaping Use  . Vaping Use: Never used  Substance and Sexual Activity  . Alcohol use: Yes    Comment: occasional  . Drug use: No  . Sexual activity: Yes    Partners: Male  Other Topics Concern  . 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 Difficulty of Paying Living Expenses:   Food Insecurity:   . Worried About Programme researcher, broadcasting/film/video in the Last Year:   . Barista in the Last Year:   Transportation Needs:   . Freight forwarder (Medical):   [name] Lack of Transportation (Non-Medical):   Physical Activity:   . Days of Exercise per Week:   . Minutes of Exercise per Session:   Stress:   . Feeling of Stress :   Social Connections:   . Frequency of Communication with Friends and Family:   . Frequency of Social Gatherings with Friends and Family:   . Attends Religious Services:   . Active Member of Clubs or Organizations:   . Attends Banker Meetings:   .  Marital Status:    Allergies  Allergen Reactions  . Other Hives  . Sulfa Antibiotics Hives   Family History  Problem Relation Age of Onset  . Renal cancer Maternal Grandmother 70  . Alcohol abuse Maternal Grandmother   . Hyperlipidemia Maternal Grandmother   . Hypertension Maternal Grandmother   . Arthritis Father   . Obesity Father   . Alcohol abuse Maternal Uncle   . Mental illness Maternal Uncle   . Mental illness Paternal Aunt   . Diabetes Paternal Aunt   . Hyperlipidemia Maternal Grandfather   . Hypertension Maternal Grandfather   . Stroke Paternal Grandmother   . Diabetes Paternal Grandmother   . Cancer Paternal Grandfather        lung  . Colon cancer Neg Hx   . Breast cancer Neg Hx         Current Outpatient Medications (Hematological):  .  ferrous sulfate 325 (65 FE)  MG tablet, Take 325 mg by mouth daily.  Current Outpatient Medications (Other):  .  Biotin 16109 MCG TABS, Take 1 tablet by mouth daily.  [name]  co-enzyme Q-10 30 MG capsule, Take 30 mg by mouth daily. .  melatonin 3 MG TABS tablet, Take 1 capsule by mouth at bedtime.  .  Omega-3 Fatty Acids (FISH OIL) 1000 MG CAPS, Take 1 capsule by mouth daily. [name]  OVER THE COUNTER MEDICATION,  .  Prenatal Vit-Fe Fumarate-FA (PRENATAL VITAMIN AND MINERAL PO), Take 1 tablet by mouth daily. [name] 1200 MG CAPS, Take by mouth. .  Turmeric (QC TUMERIC COMPLEX) 500 MG CAPS, Take by mouth. .  Vitamin D, Ergocalciferol, (DRISDOL) 1.25 MG (50000 UNIT) CAPS capsule, Take 1 capsule (50,000 Units total) by mouth every 7 (seven) days.   Reviewed prior external information including notes and imaging from  primary care provider As well as notes that were available from care everywhere and other healthcare systems.  Past medical history, social, surgical and family history all reviewed in electronic medical record.  No pertanent information unless stated regarding to the chief complaint.   Review of Systems:  No headache, visual changes, nausea, vomiting, diarrhea, constipation, dizziness, abdominal pain, skin rash, fevers, chills, night sweats, weight loss, swollen lymph nodes, body aches, joint swelling, chest pain, shortness of breath, mood changes. POSITIVE muscle aches  Objective  Blood pressure 118/84, pulse 85, height 5\' 9"  (1.753 m), weight 232 lb (105.2 kg), SpO2 95 %.   General: No apparent distress alert and oriented x3 mood and affect normal, dressed appropriately.  HEENT: Pupils equal, extraocular movements intact  Respiratory: Patient's speak in full sentences and does not appear short of breath  Cardiovascular: No lower extremity edema, non tender, no erythema  Neuro: Cranial nerves II through XII are intact, neurovascularly intact in all extremities with 2+ DTRs and 2+ pulses.  Gait normal with  good balance and coordination.  MSK:  Bilateral knees do show some mild effusion still noted.  Lacks last 5 degrees of flexion right greater than left but more tenderness with patellar grind left greater than right.    Impression and Recommendations:     The above documentation has been reviewed and is accurate and complete , DO       Note: This dictation was prepared with Dragon dictation along with smaller phrase technology. Any transcriptional errors that result from this process are unintentional.

## 2019-12-23 NOTE — Assessment & Plan Note (Signed)
Continued synovitis.  We discussed the potential for any laboratory work-up avoid patient pregnant at this point we will slow down on any type of intervention.  Patient will be referred to formal physical therapy.  Increase activity slowly.  Follow-up with me again in 6 to 8 weeks at that point patient would be in the second trimester and could potentially do steroid injections if necessary.

## 2019-12-23 NOTE — Patient Instructions (Signed)
PT [facility] Arnica Lotion See me again in 6-8 weeks

## 2019-12-24 ENCOUNTER — Ambulatory Visit (INDEPENDENT_AMBULATORY_CARE_PROVIDER_SITE_OTHER): Payer: 59 | Admitting: Adult Health

## 2019-12-31 ENCOUNTER — Ambulatory Visit (INDEPENDENT_AMBULATORY_CARE_PROVIDER_SITE_OTHER): Payer: 59 | Admitting: Adult Health

## 2019-12-31 ENCOUNTER — Encounter (INDEPENDENT_AMBULATORY_CARE_PROVIDER_SITE_OTHER): Payer: Self-pay | Admitting: Adult Health

## 2019-12-31 ENCOUNTER — Other Ambulatory Visit: Payer: Self-pay

## 2019-12-31 VITALS — BP 129/82 | HR 104 | Temp 98.1°F | Ht 69.0 in | Wt 231.0 lb

## 2019-12-31 DIAGNOSIS — E559 Vitamin D deficiency, unspecified: Secondary | ICD-10-CM | POA: Diagnosis not present

## 2019-12-31 DIAGNOSIS — Z6834 Body mass index (BMI) 34.0-34.9, adult: Secondary | ICD-10-CM

## 2019-12-31 DIAGNOSIS — E669 Obesity, unspecified: Secondary | ICD-10-CM | POA: Diagnosis not present

## 2019-12-31 DIAGNOSIS — Z3490 Encounter for supervision of normal pregnancy, unspecified, unspecified trimester: Secondary | ICD-10-CM

## 2020-01-01 DIAGNOSIS — Z3490 Encounter for supervision of normal pregnancy, unspecified, unspecified trimester: Secondary | ICD-10-CM | POA: Insufficient documentation

## 2020-01-01 NOTE — Progress Notes (Signed)
Chief Complaint:   OBESITY Jamie Bean is here to discuss her progress with her obesity treatment plan along with follow-up of her obesity related diagnoses. [name] is on the Category 2 Plan and states she is following her eating plan approximately 80% of the time. [name] states she is exercising 0 minutes 0 times per week.  Today's visit was #: 21 Starting weight: 210 lbs Starting date: 10/09/2017 Today's weight: 231 lbs Today's date: 12/31/2019 Total lbs lost to date: 0 Total lbs lost since last in-office visit: 0  Interim History: Jamie Bean is [redacted] weeks pregnant and experiencing fatigue and mild nausea without vomiting. She will have a second ultrasound this Friday. She has been focusing on protein at meals and struggling to consume on food on plan due to the frequent nausea.  Subjective:   Vitamin D deficiency. [name] has remained off Ergocalciferol and using prenatal vitamins.   Ref. Range 09/05/2019 14:53  Vitamin D, 25-Hydroxy Latest Ref Range: 30 - 100 ng/mL 29 (L)   Pregnancy, unspecified gestational age. Goal is to eat healthy and remain active during pregnancy to avoid gestational diabetes and Cesarean section delivery. [name] is [redacted] weeks pregnant.  Assessment/Plan:   Vitamin D deficiency. Low Vitamin D level contributes to fatigue and are associated with obesity, breast, and colon cancer. She agrees to continue to take prenatal vitamins as directed.   Pregnancy, unspecified gestational age. Jamie Bean will continue close follow-up with her OB/GYN provider and will continue to follow the Category 2 meal plan.  Class 1 obesity with serious comorbidity and body mass index (BMI) of 34.0 to 34.9 in adult, unspecified obesity type.  [name] is currently in the action stage of change. As such, her goal is to continue with weight loss efforts. She has agreed to the Category 2 Plan and will journal 400-800 calories and 35 grams of protein at supper.   Exercise goals:  No exercise has been prescribed at this time.  Behavioral modification strategies: increasing lean protein intake, increasing water intake, meal planning and cooking strategies and planning for success.  Jamie Bean has agreed to follow-up with our clinic in 3 weeks. She was informed of the importance of frequent follow-up visits to maximize her success with intensive lifestyle modifications for her multiple health conditions.   Objective:   Blood pressure 129/82, pulse (!) 104, temperature 98.1 F (36.7 C), temperature source Oral, height 5\' 9"  (1.753 m), weight 231 lb (104.8 kg), SpO2 99 %. Body mass index is 34.11 kg/m.  General: Cooperative, alert, well developed, in no acute distress. HEENT: Conjunctivae and lids unremarkable. Cardiovascular: Regular rhythm.  Lungs: Normal work of breathing. Neurologic: No focal deficits.   Lab Results  Component Value Date   CREATININE 0.77 09/05/2019   BUN 15 09/05/2019   NA 137 09/05/2019   K 4.0 09/05/2019   CL 105 09/05/2019   CO2 20 09/05/2019   Lab Results  Component Value Date   ALT 11 09/05/2019   AST 17 09/05/2019   ALKPHOS 37 (L) 07/01/2018   BILITOT 0.4 09/05/2019   Lab Results  Component Value Date   HGBA1C 5.0 09/05/2019   HGBA1C 5.2 07/01/2018   HGBA1C 5.4 02/04/2018   HGBA1C 5.5 10/09/2017   HGBA1C 5.4 08/31/2017   Lab Results  Component Value Date   INSULIN 7.3 07/01/2018   INSULIN 6.9 02/04/2018   INSULIN 5.8 10/09/2017   Lab Results  Component Value Date   TSH 1.10 09/05/2019   Lab Results  Component Value  Date   CHOL 192 09/05/2019   HDL 59 09/05/2019   LDLCALC 118 (H) 09/05/2019   TRIG 61 09/05/2019   CHOLHDL 3.3 09/05/2019   Lab Results  Component Value Date   WBC 6.3 11/03/2019   HGB 12.1 11/03/2019   HCT 36.5 11/03/2019   MCV 84.2 11/03/2019   PLT 176.0 11/03/2019   No results found for: IRON, TIBC, FERRITIN  Attestation Statements:   Reviewed by clinician on day of visit: allergies,  medications, problem list, medical history, surgical history, family history, social history, and previous encounter notes.  Time spent on visit including pre-visit chart review and post-visit charting and care was 29 minutes.   I, Marianna Payment, am acting as Energy manager for [employer], NP-C   I have reviewed the above documentation for accuracy and completeness, and I agree with the above. -  Julaine Fusi, NP

## 2020-01-09 ENCOUNTER — Telehealth (INDEPENDENT_AMBULATORY_CARE_PROVIDER_SITE_OTHER): Payer: 59 | Admitting: Family

## 2020-01-09 ENCOUNTER — Encounter: Payer: Self-pay | Admitting: Family

## 2020-01-09 DIAGNOSIS — K5900 Constipation, unspecified: Secondary | ICD-10-CM | POA: Diagnosis not present

## 2020-01-09 NOTE — Assessment & Plan Note (Signed)
Appears chronic, exacerbated by pregnancy, lack of exercise, PNV. Advised walking, plenty of water and colace if needed.  She will let me know how she is doing.

## 2020-01-09 NOTE — Patient Instructions (Addendum)
Walking [place] of [employer] colace daily or every other day  Congratulations on baby!

## 2020-01-09 NOTE — Progress Notes (Signed)
Virtual Visit via Video Note  I connected with@  on 01/09/20 at 11:00 AM EDT by a video enabled telemedicine application and verified that I am speaking with the correct person using two identifiers.  Location patient: home Location provider:work  Persons participating in the virtual visit: patient, provider  I discussed the limitations of evaluation and management by telemedicine and the availability of in person appointments. The patient expressed understanding and agreed to proceed.   HPI: Follow up Feels well today.   Complains of occasional constipation, even prior to pregnancy, worsened the past couple weeks.  Last BM yesterday. More gas of late .  No abdominal pain, epigastric burning, vomiting, fever.  Still participating with health and wellness and plans to stay with for duration of pregnancy.  She hasnt been walking since pregnant like she normally does. Taking PNV.  [redacted] weeks pregnant  Following with OB ,[facility] in [place].     ROS: See pertinent positives and negatives per HPI.  Past Medical History:  Diagnosis Date  . Anemia   . Anxiety   . Arthritis   . Constipation   . Female infertility   . Joint pain   . Kidney problem   . Knee pain   . Palpitations   . UTI (urinary tract infection)   . Vitamin D deficiency     Past Surgical History:  Procedure Laterality Date  . INCISE AND DRAIN ABCESS     1994    Family History  Problem Relation Age of Onset  . Renal cancer Maternal Grandmother 70  . Alcohol abuse Maternal Grandmother   . Hyperlipidemia Maternal Grandmother   . Hypertension Maternal Grandmother   . Arthritis Father   . Obesity Father   . Alcohol abuse Maternal Uncle   . Mental illness Maternal Uncle   . Mental illness Paternal Aunt   . Diabetes Paternal Aunt   . Hyperlipidemia Maternal Grandfather   . Hypertension Maternal Grandfather   . Stroke Paternal Grandmother   . Diabetes Paternal Grandmother   . Cancer Paternal  Grandfather        lung  . Colon cancer Neg Hx   . Breast cancer Neg Hx        Current Outpatient Medications:  .  Omega-3 Fatty Acids (FISH OIL) 1000 MG CAPS, Take 1 capsule by mouth daily., Disp: , Rfl:  .  OVER THE COUNTER MEDICATION, , Disp: , Rfl:  .  Prenatal Vit-Fe Fumarate-FA (PRENATAL VITAMIN AND MINERAL PO), Take 1 tablet by mouth daily., Disp: , Rfl:  .  Probiotic Product (PROBIOTIC PO), Take 1 capsule by mouth daily. Probiotic prenatal due to recent pregnancy, Disp: , Rfl:  .  Biotin 84166 MCG TABS, Take 1 tablet by mouth daily.  (Patient not taking: Reported on 01/09/2020), Disp: , Rfl:  .  co-enzyme Q-10 30 MG capsule, Take 30 mg by mouth daily. (Patient not taking: Reported on 01/09/2020), Disp: , Rfl:  .  ferrous sulfate 325 (65 FE) MG tablet, Take 325 mg by mouth daily. (Patient not taking: Reported on 01/09/2020), Disp: , Rfl:  .  Tart Cherry 1200 MG CAPS, Take by mouth.  (Patient not taking: Reported on 01/09/2020), Disp: , Rfl:  .  Turmeric (QC TUMERIC COMPLEX) 500 MG CAPS, Take by mouth.  (Patient not taking: Reported on 01/09/2020), Disp: , Rfl:   EXAM:  VITALS per patient if applicable:  GENERAL: alert, oriented, appears well and in no acute distress  HEENT: atraumatic, conjunttiva clear,  no obvious abnormalities on inspection of external nose and ears  NECK: normal movements of the head and neck  LUNGS: on inspection no signs of respiratory distress, breathing rate appears normal, no obvious gross SOB, gasping or wheezing  CV: no obvious cyanosis  MS: moves all visible extremities without noticeable abnormality  PSYCH/NEURO: pleasant and cooperative, no obvious depression or anxiety, speech and thought processing grossly intact  ASSESSMENT AND PLAN:  Discussed the following assessment and plan:  Constipation, unspecified constipation type Problem List Items Addressed This Visit      Other   Constipation    Appears chronic, exacerbated by pregnancy, lack of  exercise, PNV. Advised walking, plenty of water and colace if needed.  She will let me know how she is doing.          -we discussed possible serious and likely etiologies, options for evaluation and workup, limitations of telemedicine visit vs in person visit, treatment, treatment risks and precautions. Pt prefers to treat via telemedicine empirically rather then risking or undertaking an in person visit at this moment.     I discussed the assessment and treatment plan with the patient. The patient was provided an opportunity to ask questions and all were answered. The patient agreed with the plan and demonstrated an understanding of the instructions.   The patient was advised to call back or seek an in-person evaluation if the symptoms worsen or if the condition fails to improve as anticipated.   Rennie Plowman, FNP

## 2020-01-15 LAB — OB RESULTS CONSOLE RPR: RPR: NONREACTIVE

## 2020-01-15 LAB — OB RESULTS CONSOLE HEPATITIS B SURFACE ANTIGEN: Hepatitis B Surface Ag: NEGATIVE

## 2020-01-15 LAB — OB RESULTS CONSOLE HIV ANTIBODY (ROUTINE TESTING): HIV: NONREACTIVE

## 2020-01-19 LAB — OB RESULTS CONSOLE VARICELLA ZOSTER ANTIBODY, IGG: Varicella: IMMUNE

## 2020-01-27 ENCOUNTER — Ambulatory Visit (INDEPENDENT_AMBULATORY_CARE_PROVIDER_SITE_OTHER): Payer: 59 | Admitting: Adult Health

## 2020-01-27 ENCOUNTER — Encounter (INDEPENDENT_AMBULATORY_CARE_PROVIDER_SITE_OTHER): Payer: Self-pay | Admitting: Adult Health

## 2020-01-27 ENCOUNTER — Other Ambulatory Visit: Payer: Self-pay

## 2020-01-27 VITALS — BP 128/79 | HR 81 | Temp 98.2°F | Ht 69.0 in | Wt 225.0 lb

## 2020-01-27 DIAGNOSIS — E559 Vitamin D deficiency, unspecified: Secondary | ICD-10-CM | POA: Diagnosis not present

## 2020-01-27 DIAGNOSIS — K5900 Constipation, unspecified: Secondary | ICD-10-CM | POA: Diagnosis not present

## 2020-01-27 DIAGNOSIS — Z6833 Body mass index (BMI) 33.0-33.9, adult: Secondary | ICD-10-CM

## 2020-01-27 DIAGNOSIS — E669 Obesity, unspecified: Secondary | ICD-10-CM

## 2020-01-28 ENCOUNTER — Other Ambulatory Visit: Payer: Self-pay

## 2020-01-28 ENCOUNTER — Encounter (HOSPITAL_COMMUNITY): Payer: Self-pay | Admitting: Obstetrics and Gynecology

## 2020-01-28 ENCOUNTER — Inpatient Hospital Stay (HOSPITAL_COMMUNITY)
Admission: AD | Admit: 2020-01-28 | Discharge: 2020-01-28 | Disposition: A | Discharge status: Home / Self Care | Payer: 59 | Attending: Obstetrics and Gynecology | Admitting: Obstetrics and Gynecology

## 2020-01-28 DIAGNOSIS — Z3A11 11 weeks gestation of pregnancy: Secondary | ICD-10-CM | POA: Diagnosis not present

## 2020-01-28 DIAGNOSIS — Z882 Allergy status to sulfonamides status: Secondary | ICD-10-CM | POA: Insufficient documentation

## 2020-01-28 DIAGNOSIS — O209 Hemorrhage in early pregnancy, unspecified: Secondary | ICD-10-CM | POA: Insufficient documentation

## 2020-01-28 DIAGNOSIS — Z79890 Hormone replacement therapy (postmenopausal): Secondary | ICD-10-CM | POA: Diagnosis not present

## 2020-01-28 DIAGNOSIS — O09511 Supervision of elderly primigravida, first trimester: Secondary | ICD-10-CM | POA: Insufficient documentation

## 2020-01-28 LAB — CBC
HCT: 34.9 % — ABNORMAL LOW (ref 36.0–46.0)
Hemoglobin: 11.5 g/dL — ABNORMAL LOW (ref 12.0–15.0)
MCH: 26.7 pg (ref 26.0–34.0)
MCHC: 33 g/dL (ref 30.0–36.0)
MCV: 81.2 fL (ref 80.0–100.0)
Platelets: 213 10*3/uL (ref 150–400)
RBC: 4.3 MIL/uL (ref 3.87–5.11)
RDW: 12.4 % (ref 11.5–15.5)
WBC: 7.3 10*3/uL (ref 4.0–10.5)
nRBC: 0 % (ref 0.0–0.2)

## 2020-01-28 LAB — ABO/RH: ABO/RH(D): B POS

## 2020-01-28 NOTE — MAU Note (Signed)
Started bleeding at 0100, few small clots, no bleeding noted when used restroom here. Some pain/pressure in low back "feels like gas pain".  Office had called that she was coming (B+, [place] 9/9:viable IUP, no subchorionic hem), has hx of fibroids

## 2020-01-28 NOTE — Discharge Instructions (Signed)

## 2020-01-28 NOTE — MAU Provider Note (Signed)
History     CSN: [id number]  Arrival date and time: 01/28/20 3818   First Provider Initiated Contact with Patient 01/28/20 1012      Chief Complaint  Patient presents with   Vaginal Bleeding   HPI Jamie Bean is a 34 y.o. G1P0000 at [redacted]w[redacted]d who presents to MAU with chief complaint of vaginal bleeding. This is a recurrent problem, onset around her sixth week of pregnancy. Current episode began around 0100 today. Patient visualized a few small clots at onset but is not sure if she is still bleeding. She denies pain. She endorses multiple ultrasounds in clinic for vaginal bleeding with her prenatal care provider and states no cause has ever been identified. She is compliant with pelvic rest. She is remote from sexual intercourse. She denies all other symptoms including abdominal pain, abdominal tenderness, dysuria, fever or recent illness.  Patient receives care with [facility] and her next appointment is 02/06/2020.  OB History    Gravida  1   Para  0   Term  0   Preterm  0   AB  0   Living  0     SAB  0   TAB  0   Ectopic  0   Multiple  0   Live Births  0           Past Medical History:  Diagnosis Date   Anemia    Anxiety    Arthritis    Constipation    Female infertility    Joint pain    Kidney problem    Knee pain    Palpitations    UTI (urinary tract infection)    Vitamin D deficiency     Past Surgical History:  Procedure Laterality Date   INCISE AND DRAIN ABCESS     1994    Family History  Problem Relation Age of Onset   Renal cancer Maternal Grandmother 57   Alcohol abuse Maternal Grandmother    Hyperlipidemia Maternal Grandmother    Hypertension Maternal Grandmother    Arthritis Father    Obesity Father    Alcohol abuse Maternal Uncle    Mental illness Maternal Uncle    Mental illness Paternal Aunt    Diabetes Paternal Aunt    Hyperlipidemia Maternal Grandfather    Hypertension Maternal Grandfather    Stroke  Paternal Grandmother    Diabetes Paternal Grandmother    Cancer Paternal Grandfather        lung   Colon cancer Neg Hx    Breast cancer Neg Hx     Social History   Tobacco Use   Smoking status: Never Smoker   Smokeless tobacco: Never Used  Vaping Use   Vaping Use: Never used  Substance Use Topics   Alcohol use: Not Currently    Comment: occasional   Drug use: No    Allergies:  Allergies  Allergen Reactions   Other Hives   Sulfa Antibiotics Hives    Medications Prior to Admission  Medication Sig Dispense Refill Last Dose   Prenatal Vit-Fe Fumarate-FA (PRENATAL VITAMIN AND MINERAL PO) Take 1 tablet by mouth daily.   01/27/2020 at Unknown time   Probiotic Product (PROBIOTIC PO) Take 1 capsule by mouth daily. Probiotic prenatal due to recent pregnancy   01/27/2020 at Unknown time   progesterone (ENDOMETRIN) 100 MG vaginal insert Place 100 mg vaginally daily.   01/27/2020 at Unknown time   Biotin 29937 MCG TABS Take 1 tablet by mouth daily.  (Patient not  taking: Reported on 01/27/2020)      co-enzyme Q-10 30 MG capsule Take 30 mg by mouth daily.  (Patient not taking: Reported on 01/27/2020)      ferrous sulfate 325 (65 FE) MG tablet Take 325 mg by mouth daily.  (Patient not taking: Reported on 01/27/2020)      Omega-3 Fatty Acids (FISH OIL) 1000 MG CAPS Take 1 capsule by mouth daily. (Patient not taking: Reported on 01/27/2020)      OVER THE COUNTER MEDICATION       Tart Cherry 1200 MG CAPS Take by mouth.  (Patient not taking: Reported on 01/27/2020)      Turmeric (QC TUMERIC COMPLEX) 500 MG CAPS Take by mouth.  (Patient not taking: Reported on 01/27/2020)       Review of Systems  Gastrointestinal: Negative for abdominal pain.  Genitourinary: Positive for vaginal bleeding.  All other systems reviewed and are negative.  Physical Exam   Blood pressure 137/83, pulse 96, temperature 99.3 F (37.4 C), temperature source Oral, resp. rate 16, height 5\' 9"  (1.753 m),  weight 103.3 kg, last menstrual period 10/12/2019, SpO2 100 %.  Physical Exam Vitals and nursing note reviewed. Exam conducted with a chaperone present.  Cardiovascular:     Rate and Rhythm: Normal rate.     Pulses: Normal pulses.     Heart sounds: Normal heart sounds.  Pulmonary:     Effort: Pulmonary effort is normal.     Breath sounds: Normal breath sounds.  Abdominal:     General: Abdomen is flat.     Tenderness: There is no right CVA tenderness or left CVA tenderness.  Genitourinary:    General: Normal vulva.     Comments: Small amount of dark brown mucus near cervical os. No acute active bleeding observed Skin:    General: Skin is warm and dry.     Capillary Refill: Capillary refill takes less than 2 seconds.  Neurological:     General: No focal deficit present.     Mental Status: She is alert.     MAU Course  Procedures:    --Bedside Ultrasound for FHR check: 145 bpm Patient informed that the ultrasound is considered a limited obstetric ultrasound and is not intended to be a complete ultrasound exam.  Patient also informed that the ultrasound is not being completed with the intent of assessing for fetal or placental anomalies or any pelvic abnormalities.  Explained that the purpose of todays ultrasound is to assess for fetal heart rate.  Patient acknowledges the purpose of the exam and the limitations of the study.    Orders Placed This Encounter  Procedures   CBC   Urinalysis, Routine w reflex microscopic Urine, Clean Catch   ABO/Rh   Patient Vitals for the past 24 hrs:  BP Temp Temp src Pulse Resp SpO2 Height Weight  01/28/20 1055 135/85 -- -- 80 -- 97 % -- --  01/28/20 1054 -- 99.1 F (37.3 C) Oral -- 16 -- -- --  01/28/20 1015 -- -- -- -- -- 100 % -- --  01/28/20 1004 137/83 -- -- 96 -- -- -- --  01/28/20 0954 (!) 145/89 99.3 F (37.4 C) Oral 100 16 98 % -- --  01/28/20 0937 -- -- -- -- -- -- 5\' 9"  (1.753 m) 103.3 kg   Results for orders placed or  performed during the hospital encounter of 01/28/20 (from the past 24 hour(s))  CBC     Status: Abnormal   Collection Time:  01/28/20  9:49 AM  Result Value Ref Range   WBC 7.3 4.0 - 10.5 K/uL   RBC 4.30 3.87 - 5.11 MIL/uL   Hemoglobin 11.5 (L) 12.0 - 15.0 g/dL   HCT 24.0 (L) 36 - 46 %   MCV 81.2 80.0 - 100.0 fL   MCH 26.7 26.0 - 34.0 pg   MCHC 33.0 30.0 - 36.0 g/dL   RDW 97.3 53.2 - 99.2 %   Platelets 213 150 - 400 K/uL   nRBC 0.0 0.0 - 0.2 %  ABO/Rh     Status: None   Collection Time: 01/28/20  9:49 AM  Result Value Ref Range   ABO/RH(D) B POS    No rh immune globuloin      NOT A RH IMMUNE GLOBULIN CANDIDATE, PT RH POSITIVE Performed at [facility], [address] [place], [place] [address]    Assessment and Plan  --34 y.o. G1P0000 at [redacted]w[redacted]d  --FHT 145 via BSUS --Recurrent vaginal spotting --Blood type B POS --Discharge home in stable condition  F/U: --Pt to keep next appt with [facility] 02/06/2020  Calvert Cantor, CNM 01/28/2020, 2:49 PM

## 2020-01-29 NOTE — Progress Notes (Signed)
Chief Complaint:   OBESITY Jamie Bean is here to discuss her progress with her obesity treatment plan along with follow-up of her obesity related diagnoses. Jamie Bean is on the Category 2 Plan. Jamie Bean states she is exercising 0 minutes 0 times per week.  Today's visit was #: 22 Starting weight: 210 lbs Starting date: 10/09/2017 Today's weight: 225 lbs Today's date: 01/27/2020 Total lbs lost to date: 0 Total lbs lost since last in-office visit: 6  Interim History: Jamie Bean is currently [redacted] weeks pregnant and is closely followed by her OB/GYN. Last office visit with [facility] on 01/15/2020 showed ultrasound to be stable and she was released to her local OB/GYN. She has been craving citrus, vegetables, and potatoes. She has experienced frequent nausea without vomiting with resulting poor appetite. She has not been following the Category 2 meal plan, but more of PC/Sharp.  Subjective:   Vitamin D deficiency. Aymar is taking prenatal vitamins to increase her Vitamin D level.   Ref. Range 09/05/2019 14:53  Vitamin D, 25-Hydroxy Latest Ref Range: 30 - 100 ng/mL 29 (L)   Constipation, unspecified constipation type. Jamie Bean has not had a bowel movement in several days. She had constipation prior to her pregnancy and is also taking prenatal vitamins daily.  Assessment/Plan:   Vitamin D deficiency. Low Vitamin D level contributes to fatigue and are associated with obesity, breast, and colon cancer. She agrees to continue to take prenatal vitamins as directed and will follow-up for routine testing of Vitamin D, at least 2-3 times per year to avoid over-replacement.  Constipation, unspecified constipation type. Jamie Bean was informed that a decrease in bowel movement frequency is normal while losing weight, but stools should not be hard or painful. Orders and follow up as documented in patient record. Jamie Bean was instructed to increase her water intake and her walking. She will take  prenatal vitamins with a meal.  Counseling Getting to Good Bowel Health: Your goal is to have one soft bowel movement each day. Drink at least 8 glasses of water each day. Eat plenty of fiber (goal is over 25 grams each day). It is best to get most of your fiber from dietary sources which includes leafy green vegetables, fresh fruit, and whole grains. You may need to add fiber with the help of OTC fiber supplements. These include Metamucil, Citrucel, and Flaxseed. If you are still having trouble, try adding Miralax or Magnesium Citrate. If all of these changes do not work, Dietitian.  Class 1 obesity with serious comorbidity and body mass index (BMI) of 33.0 to 33.9 in adult, unspecified obesity type.  Jamie Bean is currently in the action stage of change. As such, her goal is to continue with weight loss efforts. She has agreed to practicing portion control and making smarter food choices, such as increasing vegetables and decreasing simple carbohydrates.   Exercise goals: Jamie Bean will increase walking for exercise.  Behavioral modification strategies: increasing lean protein intake, increasing water intake, increasing high fiber foods, meal planning and cooking strategies and planning for success.  Jamie Bean has agreed to follow-up with our clinic in 3 weeks. She was informed of the importance of frequent follow-up visits to maximize her success with intensive lifestyle modifications for her multiple health conditions.   Objective:   Blood pressure 128/79, pulse 81, temperature 98.2 F (36.8 C), height 5\' 9"  (1.753 m), weight 225 lb (102.1 kg), SpO2 97 %. Body mass index is 33.23 kg/m.  General: Cooperative, alert, well developed, in  no acute distress. HEENT: Conjunctivae and lids unremarkable. Cardiovascular: Regular rhythm.  Lungs: Normal work of breathing. Neurologic: No focal deficits.   Lab Results  Component Value Date   CREATININE 0.77 09/05/2019   BUN 15 09/05/2019    NA 137 09/05/2019   K 4.0 09/05/2019   CL 105 09/05/2019   CO2 20 09/05/2019   Lab Results  Component Value Date   ALT 11 09/05/2019   AST 17 09/05/2019   ALKPHOS 37 (L) 07/01/2018   BILITOT 0.4 09/05/2019   Lab Results  Component Value Date   HGBA1C 5.0 09/05/2019   HGBA1C 5.2 07/01/2018   HGBA1C 5.4 02/04/2018   HGBA1C 5.5 10/09/2017   HGBA1C 5.4 08/31/2017   Lab Results  Component Value Date   INSULIN 7.3 07/01/2018   INSULIN 6.9 02/04/2018   INSULIN 5.8 10/09/2017   Lab Results  Component Value Date   TSH 1.10 09/05/2019   Lab Results  Component Value Date   CHOL 192 09/05/2019   HDL 59 09/05/2019   LDLCALC 118 (H) 09/05/2019   TRIG 61 09/05/2019   CHOLHDL 3.3 09/05/2019   Lab Results  Component Value Date   WBC 7.3 01/28/2020   HGB 11.5 (L) 01/28/2020   HCT 34.9 (L) 01/28/2020   MCV 81.2 01/28/2020   PLT 213 01/28/2020   No results found for: IRON, TIBC, FERRITIN  Attestation Statements:   Reviewed by clinician on day of visit: allergies, medications, problem list, medical history, surgical history, family history, social history, and previous encounter notes.  Time spent on visit including pre-visit chart review and post-visit charting and care was 27 minutes.   I, Marianna Payment, am acting as Energy manager for [employer], NP-C   I have reviewed the above documentation for accuracy and completeness, and I agree with the above. -  Julaine Fusi, NP

## 2020-02-01 ENCOUNTER — Inpatient Hospital Stay (HOSPITAL_COMMUNITY)
Admission: AD | Admit: 2020-02-01 | Discharge: 2020-02-01 | Disposition: A | Discharge status: Home / Self Care | Payer: 59 | Attending: Obstetrics & Gynecology | Admitting: Obstetrics & Gynecology

## 2020-02-01 ENCOUNTER — Encounter (HOSPITAL_COMMUNITY): Payer: Self-pay | Admitting: Obstetrics & Gynecology

## 2020-02-01 ENCOUNTER — Other Ambulatory Visit: Payer: Self-pay

## 2020-02-01 ENCOUNTER — Inpatient Hospital Stay (HOSPITAL_COMMUNITY): Payer: 59

## 2020-02-01 DIAGNOSIS — Z3A12 12 weeks gestation of pregnancy: Secondary | ICD-10-CM | POA: Diagnosis not present

## 2020-02-01 DIAGNOSIS — O468X1 Other antepartum hemorrhage, first trimester: Secondary | ICD-10-CM | POA: Diagnosis not present

## 2020-02-01 DIAGNOSIS — O4690 Antepartum hemorrhage, unspecified, unspecified trimester: Secondary | ICD-10-CM

## 2020-02-01 DIAGNOSIS — O208 Other hemorrhage in early pregnancy: Secondary | ICD-10-CM | POA: Diagnosis present

## 2020-02-01 DIAGNOSIS — Z87440 Personal history of urinary (tract) infections: Secondary | ICD-10-CM | POA: Diagnosis not present

## 2020-02-01 DIAGNOSIS — O418X10 Other specified disorders of amniotic fluid and membranes, first trimester, not applicable or unspecified: Secondary | ICD-10-CM

## 2020-02-01 LAB — URINALYSIS, ROUTINE W REFLEX MICROSCOPIC
Bilirubin Urine: NEGATIVE
Glucose, UA: NEGATIVE mg/dL
Ketones, ur: 5 mg/dL — AB
Leukocytes,Ua: NEGATIVE
Nitrite: NEGATIVE
Protein, ur: NEGATIVE mg/dL
Specific Gravity, Urine: 1.003 — ABNORMAL LOW (ref 1.005–1.030)
pH: 7 (ref 5.0–8.0)

## 2020-02-01 LAB — WET PREP, GENITAL
Clue Cells Wet Prep HPF POC: NONE SEEN
Sperm: NONE SEEN
Trich, Wet Prep: NONE SEEN
Yeast Wet Prep HPF POC: NONE SEEN

## 2020-02-01 NOTE — MAU Provider Note (Signed)
History     CSN: [id number]  Arrival date and time: 02/01/20 1821   First Provider Initiated Contact with Patient 02/01/20 1928      Chief Complaint  Patient presents with  . Back Pain  . Vaginal Bleeding   Jamie Bean is a 34 y.o. G1P0 at [redacted]w[redacted]d who presents to MAU with complaints of vaginal bleeding. Patient has been seen multiple times in MAU for vaginal bleeding. Patient reports vaginal bleeding has been occurring intermittently since 6 weeks. Reports that etiology is unknown, patient has had multiple bedside ultrasounds performed but denies having vaginal ultrasound. Patient's most recent episode was on 9/22, reports vaginal bleeding stopped then restarted this evening around 1730. She describes vaginal bleeding as dark red without clots. Reports not enough vaginal bleeding that completely saturated pad but about 50% was saturated. She denies any abdominal pain. She reports compliancy with pelvic rest, denies IC. She receives prenatal care at [facility] and next appointment is Friday.    OB History    Gravida  1   Para  0   Term  0   Preterm  0   AB  0   Living  0     SAB  0   TAB  0   Ectopic  0   Multiple  0   Live Births  0           Past Medical History:  Diagnosis Date  . Anemia   . Anxiety   . Arthritis   . Constipation   . Female infertility   . Joint pain   . Kidney problem   . Knee pain   . Palpitations   . UTI (urinary tract infection)   . Vitamin D deficiency     Past Surgical History:  Procedure Laterality Date  . INCISE AND DRAIN ABCESS     1994    Family History  Problem Relation Age of Onset  . Renal cancer Maternal Grandmother 70  . Alcohol abuse Maternal Grandmother   . Hyperlipidemia Maternal Grandmother   . Hypertension Maternal Grandmother   . Arthritis Father   . Obesity Father   . Alcohol abuse Maternal Uncle   . Mental illness Maternal Uncle   . Mental illness Paternal Aunt   . Diabetes Paternal Aunt   .  Hyperlipidemia Maternal Grandfather   . Hypertension Maternal Grandfather   . Stroke Paternal Grandmother   . Diabetes Paternal Grandmother   . Cancer Paternal Grandfather        lung  . Colon cancer Neg Hx   . Breast cancer Neg Hx     Social History   Tobacco Use  . Smoking status: Never Smoker  . Smokeless tobacco: Never Used  Vaping Use  . Vaping Use: Never used  Substance Use Topics  . Alcohol use: Not Currently    Comment: occasional  . Drug use: No    Allergies:  Allergies  Allergen Reactions  . Other Hives  . Sulfa Antibiotics Hives    Medications Prior to Admission  Medication Sig Dispense Refill Last Dose  . Biotin 80998 MCG TABS Take 1 tablet by mouth daily.  (Patient not taking: Reported on 01/27/2020)     . co-enzyme Q-10 30 MG capsule Take 30 mg by mouth daily.  (Patient not taking: Reported on 01/27/2020)     . ferrous sulfate 325 (65 FE) MG tablet Take 325 mg by mouth daily.  (Patient not taking: Reported on 01/27/2020)     .  Omega-3 Fatty Acids (FISH OIL) 1000 MG CAPS Take 1 capsule by mouth daily. (Patient not taking: Reported on 01/27/2020)     . OVER THE COUNTER MEDICATION      . Prenatal Vit-Fe Fumarate-FA (PRENATAL VITAMIN AND MINERAL PO) Take 1 tablet by mouth daily.     . Probiotic Product (PROBIOTIC PO) Take 1 capsule by mouth daily. Probiotic prenatal due to recent pregnancy     . progesterone (ENDOMETRIN) 100 MG vaginal insert Place 100 mg vaginally daily.     [name] 1200 MG CAPS Take by mouth.  (Patient not taking: Reported on 01/27/2020)     . Turmeric (QC TUMERIC COMPLEX) 500 MG CAPS Take by mouth.  (Patient not taking: Reported on 01/27/2020)       Review of Systems  Constitutional: Negative.   Respiratory: Negative.   Cardiovascular: Negative.   Gastrointestinal: Negative.   Genitourinary: Positive for vaginal bleeding. Negative for difficulty urinating, dysuria, frequency, pelvic pain and urgency.  Musculoskeletal: Negative.    Neurological: Negative.   Psychiatric/Behavioral: Negative.    Physical Exam   Blood pressure (!) 146/78, pulse 90, temperature 99.2 F (37.3 C), temperature source Oral, resp. rate 16, height 5\' 9"  (1.753 m), weight 104.5 kg, last menstrual period 10/12/2019, SpO2 100 %.  Physical Exam Vitals and nursing note reviewed.  HENT:     Head: Normocephalic.  Cardiovascular:     Rate and Rhythm: Normal rate and regular rhythm.  Pulmonary:     Effort: Pulmonary effort is normal. No respiratory distress.     Breath sounds: Normal breath sounds. No wheezing.  Abdominal:     Palpations: Abdomen is soft. There is no mass.     Tenderness: There is no abdominal tenderness. There is no guarding.  Genitourinary:    Cervix: Cervical bleeding present.     Comments: Pelvic exam: Cervix pink, visually closed, without lesion, moderate amount of dark red thin bleeding pooling into back of vaginal canal and trickling down legs, vaginal walls and external genitalia normal Skin:    General: Skin is warm and dry.  Neurological:     Mental Status: She is alert and oriented to person, place, and time.  Psychiatric:        Mood and Affect: Mood normal.        Behavior: Behavior normal.        Thought Content: Thought content normal.    Pt informed that the ultrasound is considered a limited OB ultrasound and is not intended to be a complete ultrasound exam.  Patient also informed that the ultrasound is not being completed with the intent of assessing for fetal or placental anomalies or any pelvic abnormalities.  Explained that the purpose of today's ultrasound is to assess for  viability.  Patient acknowledges the purpose of the exam and the limitations of the study.   FHR 146 by bedside 12/12/2019   MAU Course  Procedures  MDM Orders Placed This Encounter  Procedures  . Wet prep, genital  . US OB Comp Less 14 Wks  . Urinalysis, Routine w reflex microscopic Urine, Clean Catch   Patient has not had  official US to find etiology of vaginal bleeding. Official US ordered.   Labs and [place] report reviewed:  Results for orders placed or performed during the hospital encounter of 02/01/20 (from the past 24 hour(s))  Wet prep, genital     Status: Abnormal   Collection Time: 02/01/20  7:57 PM   Specimen: Urine, Clean Catch  Result Value Ref Range   Yeast Wet Prep HPF POC NONE SEEN NONE SEEN   Trich, Wet Prep NONE SEEN NONE SEEN   Clue Cells Wet Prep HPF POC NONE SEEN NONE SEEN   WBC, Wet Prep HPF POC FEW (A) NONE SEEN   Sperm NONE SEEN    US OB Comp Less 14 Wks  Result Date: 02/01/2020 CLINICAL DATA:  Pregnant patient with bleeding. EXAM: OBSTETRIC <14 WK ULTRASOUND TECHNIQUE: Transabdominal ultrasound was performed for evaluation of the gestation as well as the maternal uterus and adnexal regions. COMPARISON:  None. FINDINGS: Intrauterine gestational sac: Single Yolk sac:  Not Visualized. Embryo:  Visualized. Cardiac Activity: Visualized. Heart Rate: 164 bpm CRL:   50.0 mm   11 w 5 d                  [place] EDC: 08/17/2020 Subchorionic hemorrhage:  Small Maternal uterus/adnexae: The right ovary is not visualized. Normal appearance of the left ovary. Multiple uterine fibroids. Largest fibroid within the anterior uterine body measures 7.6 x 5.8 x 7.4 cm. No free fluid in the pelvis. IMPRESSION: Single live intrauterine gestation.  Small subchorionic hemorrhage. Electronically Signed   By: Annia Belt M.D.   On: 02/01/2020 20:26   Discussed with patient results of [place] and labs. Patient is relieved that she finally has an answer to what is going on. Educated and discussed [facility], what to expect with bleeding, precautions and reasons to present to MAU. Encouraged patient that [facility] usually resolves around 16 weeks and that vaginal bleeding can continue until resolved.   Discussed to continue pelvic rest, abstain from exercise or heavy lifting. Discussed reasons to present to MAU. Follow up as scheduled in the  office. Return to MAU as needed. Pt stable at time of discharge.   Assessment and Plan   1. Subchorionic hemorrhage of placenta in first trimester, single or unspecified fetus   2. Vaginal bleeding during pregnancy   3. [redacted] weeks gestation of pregnancy    Discharge home Follow up as scheduled in the office for prenatal care Return to MAU as needed for reasons discussed and/or emergencies  [facility] precautions    Follow-up Information    Ob/[facility], [facility] Follow up.   Specialty: Obstetrics and Gynecology Why: Follow up as scheduled for prenatal care on 02/06/20 Contact information: [address] [place] [address] [phone]               Sharyon Cable CNM 02/01/2020, 9:04 PM

## 2020-02-01 NOTE — MAU Note (Signed)
Jamie Bean is a 34 y.o. at [redacted]w[redacted]d here in MAU reporting: heavy bleeding for the past hour. States she noticed it when she wiped. Put on a pad but has not checked for more bleeding. No clots. No recent IC. Having some mild back pain.  Onset of complaint: today  Pain score: 3/10  Vitals:   02/01/20 1833  BP: (!) 146/78  Pulse: 90  Resp: 16  Temp: 99.2 F (37.3 C)  SpO2: 100%     Lab orders placed from triage: UA

## 2020-02-02 LAB — GC/CHLAMYDIA PROBE AMP (~~LOC~~) NOT AT ARMC
Chlamydia: NEGATIVE
Comment: NEGATIVE
Comment: NORMAL
Neisseria Gonorrhea: NEGATIVE

## 2020-02-10 ENCOUNTER — Ambulatory Visit: Payer: 59 | Admitting: Family Medicine

## 2020-02-10 NOTE — Progress Notes (Deleted)
[facility] [address] [place] [address] Phone: [phone] Subjective:    I'm seeing this patient by the request  of:  Allegra Grana, FNP  CC: Bilateral knee pain follow-up  RSW:[id number]   12/23/2019 Continued synovitis.  We discussed the potential for any laboratory work-up avoid patient pregnant at this point we will slow down on any type of intervention.  Patient will be referred to formal physical therapy.  Increase activity slowly.  Follow-up with me again in 6 to 8 weeks at that point patient would be in the second trimester and could potentially do steroid injections if necessary.   Update 02/10/2020 Jamie Bean is a 34 y.o. female coming in with complaint of bilateral knee pain. Patient states      Past Medical History:  Diagnosis Date  . Anemia   . Anxiety   . Arthritis   . Constipation   . Female infertility   . Joint pain   . Kidney problem   . Knee pain   . Palpitations   . UTI (urinary tract infection)   . Vitamin D deficiency    Past Surgical History:  Procedure Laterality Date  . INCISE AND DRAIN ABCESS     1994   Social History   Socioeconomic History  . Marital status: Married    Spouse name: Gala Romney  . Number of children: Not on file  . Years of education: Not on file  . Highest education level: Not on file  Occupational History  . Occupation: Warden/ranger    Comment: [employer]  Tobacco Use  . Smoking status: Never Smoker  . Smokeless tobacco: Never Used  Vaping Use  . Vaping Use: Never used  Substance and Sexual Activity  . Alcohol use: Not Currently    Comment: occasional  . Drug use: No  . Sexual activity: Yes    Partners: Male  Other Topics Concern  . 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 Difficulty of Paying Living Expenses: Not on file  Food Insecurity:   . Worried About Programme researcher, broadcasting/film/video in the Last Year: Not on file  . Ran Out of Food in the Last Year: Not on file  Transportation Needs:   . Lack of Transportation (Medical): Not on file  . Lack of Transportation (Non-Medical): Not on file  Physical Activity:   . Days of Exercise per Week: Not on file  . Minutes of Exercise per Session: Not on file  Stress:   . Feeling of Stress : Not on file  Social Connections:   . Frequency of Communication with Friends and Family: Not on file  . Frequency of Social Gatherings with Friends and Family: Not on file  . Attends Religious Services: Not on file  . Active Member of Clubs or Organizations: Not on file  . Attends Banker Meetings: Not on file  . Marital Status: Not on file   Allergies  Allergen Reactions  . Other Hives  . Sulfa Antibiotics Hives   Family History  Problem Relation Age of Onset  . Renal cancer Maternal Grandmother 70  . Alcohol abuse Maternal Grandmother   .  Hyperlipidemia Maternal Grandmother   . Hypertension Maternal Grandmother   . Arthritis Father   . Obesity Father   . Alcohol abuse Maternal Uncle   . Mental illness Maternal Uncle   . Mental illness Paternal Aunt   . Diabetes Paternal Aunt   . Hyperlipidemia Maternal Grandfather   . Hypertension Maternal Grandfather   . Stroke Paternal Grandmother   . Diabetes Paternal Grandmother   . Cancer Paternal Grandfather        lung  . Colon cancer Neg Hx   . Breast cancer Neg Hx         Current Outpatient Medications (Hematological):  .  ferrous sulfate 325 (65 FE) MG tablet, Take 325 mg by mouth daily.  (Patient not taking: Reported on 01/27/2020)  Current Outpatient Medications (Other):  [name]  OVER THE COUNTER MEDICATION,  .  Prenatal Vit-Fe Fumarate-FA (PRENATAL VITAMIN AND MINERAL PO), Take 1 tablet by mouth daily. .  Probiotic Product (PROBIOTIC  PO), Take 1 capsule by mouth daily. Probiotic prenatal due to recent pregnancy .  progesterone (ENDOMETRIN) 100 MG vaginal insert, Place 100 mg vaginally daily.   Reviewed prior external information including notes and imaging from  primary care provider As well as notes that were available from care everywhere and other healthcare systems.  Past medical history, social, surgical and family history all reviewed in electronic medical record.  No pertanent information unless stated regarding to the chief complaint.   Review of Systems:  No headache, visual changes, nausea, vomiting, diarrhea, constipation, dizziness, abdominal pain, skin rash, fevers, chills, night sweats, weight loss, swollen lymph nodes, body aches, joint swelling, chest pain, shortness of breath, mood changes. POSITIVE muscle aches  Objective  Last menstrual period 10/12/2019.   General: No apparent distress alert and oriented x3 mood and affect normal, dressed appropriately.  HEENT: Pupils equal, extraocular movements intact  Respiratory: Patient's speak in full sentences and does not appear short of breath  Cardiovascular: No lower extremity edema, non tender, no erythema  Neuro: Cranial nerves II through XII are intact, neurovascularly intact in all extremities with 2+ DTRs and 2+ pulses.  Gait normal with good balance and coordination.  MSK:  Non tender with full range of motion and good stability and symmetric strength and tone of shoulders, elbows, wrist, hip, knee and ankles bilaterally.     Impression and Recommendations:     The above documentation has been reviewed and is accurate and complete Judi Saa, DO

## 2020-02-18 ENCOUNTER — Encounter (INDEPENDENT_AMBULATORY_CARE_PROVIDER_SITE_OTHER): Payer: Self-pay | Admitting: Adult Health

## 2020-02-18 ENCOUNTER — Other Ambulatory Visit: Payer: Self-pay

## 2020-02-18 ENCOUNTER — Ambulatory Visit (INDEPENDENT_AMBULATORY_CARE_PROVIDER_SITE_OTHER): Payer: 59 | Admitting: Adult Health

## 2020-02-18 VITALS — BP 117/74 | HR 77 | Temp 98.2°F | Ht 69.0 in | Wt 224.0 lb

## 2020-02-18 DIAGNOSIS — K5900 Constipation, unspecified: Secondary | ICD-10-CM

## 2020-02-18 DIAGNOSIS — E559 Vitamin D deficiency, unspecified: Secondary | ICD-10-CM | POA: Diagnosis not present

## 2020-02-18 DIAGNOSIS — E669 Obesity, unspecified: Secondary | ICD-10-CM

## 2020-02-18 DIAGNOSIS — Z6833 Body mass index (BMI) 33.0-33.9, adult: Secondary | ICD-10-CM | POA: Diagnosis not present

## 2020-02-19 NOTE — Progress Notes (Signed)
Chief Complaint:   OBESITY Jamie Bean is here to discuss her progress with her obesity treatment plan along with follow-up of her obesity related diagnoses. Jamie Bean is practicing portion control and making smarter food choices, such as increasing vegetables and decreasing simple carbohydrates and states she is following her eating plan approximately 100% of the time. Jamie Bean states she is exercising 0 minutes 0 times per week.  Today's visit was #: 23 Starting weight: 210 lbs Starting date: 10/09/2017 Today's weight: 224 lbs Today's date: 02/18/2020 Total lbs lost to date: 0 Total lbs lost since last in-office visit: 1  Interim History: Jamie Bean is 14 weeks, 4 days with a 75% reduction of nausea without vomiting. She was recently treated for subchorionic hemorrhage of placenta - bleeding has stopped, pregnancy stable. She has been craving fruits and vegetables the last few weeks. Her appetite is steadily improving since nausea is abating.  Subjective:   Vitamin D deficiency. Vitamin D level on 09/05/2019 was 29, below goal of 50. Jamie Bean was converted from Ergocalciferol to prenatal vitamins once pregnancy was reported.   Ref. Range 09/05/2019 14:53  Vitamin D, 25-Hydroxy Latest Ref Range: 30 - 100 ng/mL 29 (L)   Constipation, unspecified constipation type. Jamie Bean states her nurse midwife recommended Colace and Magnesium to help stimulate bowel movements.  Assessment/Plan:   Vitamin D deficiency. Low Vitamin D level contributes to fatigue and are associated with obesity, breast, and colon cancer. She agrees to continue to take prenatal vitamins as directed and will follow-up for routine testing of Vitamin D, at least 2-3 times per year to avoid over-replacement.  Constipation, unspecified constipation type. Jamie Bean was informed that a decrease in bowel movement frequency is normal during pregnancy, but stools should not be hard or painful. Orders and follow up as  documented in patient record. She will use Colace and Magnesium as directed by nurse midwife. She will increase her water intake and increase fiber in her diet.  Counseling Getting to Good Bowel Health: Your goal is to have one soft bowel movement each day. Drink at least 8 glasses of water each day. Eat plenty of fiber (goal is over 25 grams each day). It is best to get most of your fiber from dietary sources which includes leafy green vegetables, fresh fruit, and whole grains. You may need to add fiber with the help of OTC fiber supplements. These include Metamucil, Citrucel, and Flaxseed. If you are still having trouble, try adding Miralax or Magnesium Citrate. If all of these changes do not work, Dietitian.  Class 1 obesity with serious comorbidity and body mass index (BMI) of 33.0 to 33.9 in adult, unspecified obesity type.  Jamie Bean is currently in the action stage of change. As such, her goal is to continue with weight loss efforts. She has agreed to practicing portion control and making smarter food choices, such as increasing vegetables and decreasing simple carbohydrates.   She was provided a list of high protein vegetables and advised to wash fruits and vegetables very thoroughly prior to consuming.  Exercise goals: Activty as tolerated, second trimester of pregnancy.  Behavioral modification strategies: increasing lean protein intake, increasing water intake, meal planning and cooking strategies and planning for success.  Jamie Bean has agreed to follow-up with our clinic in 3 weeks. She was informed of the importance of frequent follow-up visits to maximize her success with intensive lifestyle modifications for her multiple health conditions.   Objective:   Blood pressure 117/74, pulse 77, temperature  98.2 F (36.8 C), height 5\' 9"  (1.753 m), weight 224 lb (101.6 kg), last menstrual period 10/12/2019, SpO2 98 %. Body mass index is 33.08 kg/m.  General: Cooperative,  alert, well developed, in no acute distress. HEENT: Conjunctivae and lids unremarkable. Cardiovascular: Regular rhythm.  Lungs: Normal work of breathing. Neurologic: No focal deficits.   Lab Results  Component Value Date   CREATININE 0.77 09/05/2019   BUN 15 09/05/2019   NA 137 09/05/2019   K 4.0 09/05/2019   CL 105 09/05/2019   CO2 20 09/05/2019   Lab Results  Component Value Date   ALT 11 09/05/2019   AST 17 09/05/2019   ALKPHOS 37 (L) 07/01/2018   BILITOT 0.4 09/05/2019   Lab Results  Component Value Date   HGBA1C 5.0 09/05/2019   HGBA1C 5.2 07/01/2018   HGBA1C 5.4 02/04/2018   HGBA1C 5.5 10/09/2017   HGBA1C 5.4 08/31/2017   Lab Results  Component Value Date   INSULIN 7.3 07/01/2018   INSULIN 6.9 02/04/2018   INSULIN 5.8 10/09/2017   Lab Results  Component Value Date   TSH 1.10 09/05/2019   Lab Results  Component Value Date   CHOL 192 09/05/2019   HDL 59 09/05/2019   LDLCALC 118 (H) 09/05/2019   TRIG 61 09/05/2019   CHOLHDL 3.3 09/05/2019   Lab Results  Component Value Date   WBC 7.3 01/28/2020   HGB 11.5 (L) 01/28/2020   HCT 34.9 (L) 01/28/2020   MCV 81.2 01/28/2020   PLT 213 01/28/2020   No results found for: IRON, TIBC, FERRITIN  Attestation Statements:   Reviewed by clinician on day of visit: allergies, medications, problem list, medical history, surgical history, family history, social history, and previous encounter notes.  Time spent on visit including pre-visit chart review and post-visit charting and care was 29 minutes.   I, 01/30/2020, am acting as Jamie Bean for Energy manager, NP-C   I have reviewed the above documentation for accuracy and completeness, and I agree with the above. -  Jamie Bean d. Jamie Russomanno, NP-C

## 2020-03-09 ENCOUNTER — Other Ambulatory Visit: Payer: Self-pay

## 2020-03-09 ENCOUNTER — Ambulatory Visit (INDEPENDENT_AMBULATORY_CARE_PROVIDER_SITE_OTHER): Payer: 59 | Admitting: Adult Health

## 2020-03-09 ENCOUNTER — Encounter (INDEPENDENT_AMBULATORY_CARE_PROVIDER_SITE_OTHER): Payer: Self-pay | Admitting: Adult Health

## 2020-03-09 VITALS — BP 110/69 | HR 64 | Temp 98.2°F | Ht 69.0 in | Wt 226.0 lb

## 2020-03-09 DIAGNOSIS — Z6833 Body mass index (BMI) 33.0-33.9, adult: Secondary | ICD-10-CM | POA: Diagnosis not present

## 2020-03-09 DIAGNOSIS — E7849 Other hyperlipidemia: Secondary | ICD-10-CM | POA: Diagnosis not present

## 2020-03-09 DIAGNOSIS — E559 Vitamin D deficiency, unspecified: Secondary | ICD-10-CM

## 2020-03-09 DIAGNOSIS — E669 Obesity, unspecified: Secondary | ICD-10-CM

## 2020-03-10 NOTE — Progress Notes (Signed)
Chief Complaint:   OBESITY Jamie Bean is here to discuss her progress with her obesity treatment plan along with follow-up of her obesity related diagnoses. Jamie Bean is keeping a food journal and adhering to recommended goals of 1300-1400 calories and 40-50 grams of protein and states she is following her eating plan approximately 75% of the time. Jamie Bean states she is exercising 0 minutes 0 times per week.  Today's visit was #: 24 Starting weight: 210 lbs Starting date: 10/09/2017 Today's weight: 226 lbs Today's date: 03/09/2020 Total lbs lost to date: 0 Total lbs lost since last in-office visit: 0  Interim History: Jamie Bean is currently [redacted] weeks pregnant and appetite has improved - nausea resolved.  She has been focusing on protein at each meal. She has been consuming chicken, non heavy metal seafood/salmon, and "whatever snacks sound good."  Her next OB/GYN appointment with CNM is 03/16/2020. She has been tracking her intake and calories have been hovering around 1300-1400 a day with 40-50 grams of protein daily.  Subjective:   Other hyperlipidemia pure. Jamie Bean has hyperlipidemia and has been trying to improve her cholesterol levels with intensive lifestyle modification including a low saturated fat diet, exercise and weight loss. She denies any chest pain, claudication or myalgias. 09/05/2019 LDL cholesterol above goal.. Jamie Bean is not on statin therapy.  Lab Results  Component Value Date   ALT 11 09/05/2019   AST 17 09/05/2019   ALKPHOS 37 (L) 07/01/2018   BILITOT 0.4 09/05/2019   Lab Results  Component Value Date   CHOL 192 09/05/2019   HDL 59 09/05/2019   LDLCALC 118 (H) 09/05/2019   TRIG 61 09/05/2019   CHOLHDL 3.3 09/05/2019   Vitamin D deficiency. Vitamin D level on 09/05/2019 was 29, well below goal of 50. Jamie Bean.   Ref. Range 09/05/2019 14:53  Vitamin D, 25-Hydroxy  Latest Ref Range: 30 - 100 ng/mL 29 (L)   Assessment/Plan:   Other hyperlipidemia pure. Cardiovascular risk and specific lipid/LDL goals reviewed.  We discussed several lifestyle modifications today and Jamie Bean will continue to work on diet, exercise and weight loss efforts. Orders and follow up as documented in patient record. She will continue to decrease saturated fat in her diet. Labs will be checked at her next office visit - lipids/CMP.  Counseling Intensive lifestyle modifications are the first line treatment for this issue. . Dietary changes: Increase soluble fiber. Decrease simple carbohydrates. . Exercise changes: Moderate to vigorous-intensity aerobic activity 150 minutes per week if tolerated. . Lipid-lowering medications: see documented in medical record.  Vitamin D deficiency. Low Vitamin D level contributes to fatigue and are associated with obesity, breast, and colon cancer. She will follow-up for routine testing of Vitamin D at the time of her next office visit.  Class 1 obesity with serious comorbidity and body mass index (BMI) of 33.0 to 33.9 in adult, unspecified obesity type.  Jamie Bean is currently in the action stage of change. As such, her goal is to continue with weight loss efforts. She has agreed to practicing portion control and making smarter food choices, such as increasing vegetables and decreasing simple carbohydrates with 80 grams of protein daily.   Exercise goals: No exercise has been prescribed at this time.  Behavioral modification strategies: increasing lean protein intake, increasing water intake, no skipping meals, meal planning and cooking strategies, keeping healthy foods in the home and planning for success.  Jamie Bean has agreed to follow-up  with our clinic fasting in 3 weeks. She was informed of the importance of frequent follow-up visits to maximize her success with intensive lifestyle modifications for her multiple health conditions.   Objective:    Blood pressure 110/69, pulse 64, temperature 98.2 F (36.8 C), height 5\' 9"  (1.753 m), weight 226 lb (102.5 kg), last menstrual period 10/12/2019, SpO2 100 %. Body mass index is 33.37 kg/m.  General: Cooperative, alert, well developed, in no acute distress. HEENT: Conjunctivae and lids unremarkable. Cardiovascular: Regular rhythm.  Lungs: Normal work of breathing. Neurologic: No focal deficits.   Lab Results  Component Value Date   CREATININE 0.77 09/05/2019   BUN 15 09/05/2019   NA 137 09/05/2019   K 4.0 09/05/2019   CL 105 09/05/2019   CO2 20 09/05/2019   Lab Results  Component Value Date   ALT 11 09/05/2019   AST 17 09/05/2019   ALKPHOS 37 (L) 07/01/2018   BILITOT 0.4 09/05/2019   Lab Results  Component Value Date   HGBA1C 5.0 09/05/2019   HGBA1C 5.2 07/01/2018   HGBA1C 5.4 02/04/2018   HGBA1C 5.5 10/09/2017   HGBA1C 5.4 08/31/2017   Lab Results  Component Value Date   INSULIN 7.3 07/01/2018   INSULIN 6.9 02/04/2018   INSULIN 5.8 10/09/2017   Lab Results  Component Value Date   TSH 1.10 09/05/2019   Lab Results  Component Value Date   CHOL 192 09/05/2019   HDL 59 09/05/2019   LDLCALC 118 (H) 09/05/2019   TRIG 61 09/05/2019   CHOLHDL 3.3 09/05/2019   Lab Results  Component Value Date   WBC 7.3 01/28/2020   HGB 11.5 (L) 01/28/2020   HCT 34.9 (L) 01/28/2020   MCV 81.2 01/28/2020   PLT 213 01/28/2020   No results found for: IRON, TIBC, FERRITIN  Attestation Statements:   Reviewed by clinician on day of visit: allergies, medications, problem list, medical history, surgical history, family history, social history, and previous encounter notes.  Time spent on visit including pre-visit chart review and post-visit charting and care was 29 minutes.   I, 01/30/2020, am acting as Jamie Bean for Energy manager, NP-C   I have reviewed the above documentation for accuracy and completeness, and I agree with the above. -  Marji Kuehnel d. Piercen Covino, NP-C

## 2020-03-23 ENCOUNTER — Ambulatory Visit (INDEPENDENT_AMBULATORY_CARE_PROVIDER_SITE_OTHER): Payer: 59 | Admitting: Bariatrics

## 2020-03-23 ENCOUNTER — Ambulatory Visit (INDEPENDENT_AMBULATORY_CARE_PROVIDER_SITE_OTHER): Payer: 59 | Admitting: Family Medicine

## 2020-04-06 ENCOUNTER — Ambulatory Visit (INDEPENDENT_AMBULATORY_CARE_PROVIDER_SITE_OTHER): Payer: 59 | Admitting: Adult Health

## 2020-04-06 ENCOUNTER — Encounter (INDEPENDENT_AMBULATORY_CARE_PROVIDER_SITE_OTHER): Payer: Self-pay | Admitting: Adult Health

## 2020-04-06 ENCOUNTER — Other Ambulatory Visit: Payer: Self-pay

## 2020-04-06 VITALS — BP 112/63 | HR 74 | Temp 98.2°F | Ht 69.0 in | Wt 237.0 lb

## 2020-04-06 DIAGNOSIS — E7849 Other hyperlipidemia: Secondary | ICD-10-CM | POA: Diagnosis not present

## 2020-04-06 DIAGNOSIS — Z9189 Other specified personal risk factors, not elsewhere classified: Secondary | ICD-10-CM

## 2020-04-06 DIAGNOSIS — Z6835 Body mass index (BMI) 35.0-35.9, adult: Secondary | ICD-10-CM | POA: Diagnosis not present

## 2020-04-06 DIAGNOSIS — E559 Vitamin D deficiency, unspecified: Secondary | ICD-10-CM

## 2020-04-06 NOTE — Progress Notes (Signed)
Chief Complaint:   OBESITY Jamie Bean is here to discuss her progress with her obesity treatment plan along with follow-up of her obesity related diagnoses. Federica is practicing portion control and making smarter food choices, such as increasing vegetables and decreasing simple carbohydrates and states she is following her eating plan approximately 75% of the time. Christyanna states she is exercising 0 minutes 0 times per week.  Today's visit was #: 25 Starting weight: 210 lbs Starting date: 10/09/2017 Today's weight: 237 lbs Today's date: 04/06/2020 Total lbs lost to date: 0 Total lbs lost since last in-office visit: 0  Interim History: Jamie Bean is currently [redacted] weeks pregnant. She reports resolution of nausea/constipation. She reports increased energy and has told all her family/friends about her exciting news! She has an appointment with her OB/GYN later today.  Subjective:   Hyperlipidemia, pure. Jamie Bean has hyperlipidemia and has been trying to improve her cholesterol levels with intensive lifestyle modification including a low saturated fat diet, exercise and weight loss. She denies any chest pain, claudication or myalgias. Lipid panel on 09/05/2019 revealed all levels to be normal with elevated LDL. Jamie Bean denies cardiac symptoms.  Lab Results  Component Value Date   ALT 11 09/05/2019   AST 17 09/05/2019   ALKPHOS 37 (L) 07/01/2018   BILITOT 0.4 09/05/2019   Lab Results  Component Value Date   CHOL 192 09/05/2019   HDL 59 09/05/2019   LDLCALC 118 (H) 09/05/2019   TRIG 61 09/05/2019   CHOLHDL 3.3 09/05/2019   Vitamin D deficiency. Vitamin D level was 29 on 09/05/2019. Serine was converted from Ergocalciferol to prenatal vitamins after confirmed pregnancy.   Ref. Range 09/05/2019 14:53  Vitamin D, 25-Hydroxy Latest Ref Range: 30 - 100 ng/mL 29 (L)   At risk for heart disease. Jamie Bean is at a higher than average risk for cardiovascular disease due to  hyperlipidemia and obesity.   Assessment/Plan:   Hyperlipidemia, pure.  Cardiovascular risk and specific lipid/LDL goals reviewed.  We discussed several lifestyle modifications today and Kaizley will continue to work on diet, exercise and weight loss efforts. Orders and follow up as documented in patient record. Labs will be checked today.   Counseling Intensive lifestyle modifications are the first line treatment for this issue.  Dietary changes: Increase soluble fiber. Decrease simple carbohydrates.  Exercise changes: Moderate to vigorous-intensity aerobic activity 150 minutes per week if tolerated.  Lipid-lowering medications: see documented in medical record.  Vitamin D deficiency. Low Vitamin D level contributes to fatigue and are associated with obesity, breast, and colon cancer. VITAMIN D 25 Hydroxy (Vit-D Deficiency, Fractures) level will be checked today.   At risk for heart disease. Jamie Bean was given approximately 15 minutes of coronary artery disease prevention counseling today. She is 34 y.o. female and has risk factors for heart disease including obesity. We discussed intensive lifestyle modifications today with an emphasis on specific weight loss instructions and strategies.   Repetitive spaced learning was employed today to elicit superior memory formation and behavioral change.  Class 2 severe obesity with serious comorbidity and body mass index (BMI) of 35.0 to 35.9 in adult, unspecified obesity type (HCC).  Jamie Bean is currently in the action stage of change. As such, her goal is to continue with weight loss efforts. She has agreed to following a lower carbohydrate, vegetable and lean protein rich diet plan and PC/[place] with 80 grams of protein daily.   Exercise goals: Jamie Bean will increase her daily walking.  Behavioral modification strategies:  increasing lean protein intake, decreasing simple carbohydrates, increasing vegetables, increasing water intake, meal planning  and cooking strategies, keeping healthy foods in the home, better snacking choices and planning for success.  Jamie Bean has agreed to follow-up with our clinic in 2 weeks. She was informed of the importance of frequent follow-up visits to maximize her success with intensive lifestyle modifications for her multiple health conditions.   Jamie Bean was informed we would discuss her lab results at her next visit unless there is a critical issue that needs to be addressed sooner. Jamie Bean agreed to keep her next visit at the agreed upon time to discuss these results.  Objective:   Blood pressure 112/63, pulse 74, temperature 98.2 F (36.8 C), height 5\' 9"  (1.753 m), weight 237 lb (107.5 kg), last menstrual period 10/12/2019, SpO2 96 %. Body mass index is 35 kg/m.  General: Cooperative, alert, well developed, in no acute distress. HEENT: Conjunctivae and lids unremarkable. Cardiovascular: Regular rhythm.  Lungs: Normal work of breathing. Neurologic: No focal deficits.   Lab Results  Component Value Date   CREATININE 0.77 09/05/2019   BUN 15 09/05/2019   NA 137 09/05/2019   K 4.0 09/05/2019   CL 105 09/05/2019   CO2 20 09/05/2019   Lab Results  Component Value Date   ALT 11 09/05/2019   AST 17 09/05/2019   ALKPHOS 37 (L) 07/01/2018   BILITOT 0.4 09/05/2019   Lab Results  Component Value Date   HGBA1C 5.0 09/05/2019   HGBA1C 5.2 07/01/2018   HGBA1C 5.4 02/04/2018   HGBA1C 5.5 10/09/2017   HGBA1C 5.4 08/31/2017   Lab Results  Component Value Date   INSULIN 7.3 07/01/2018   INSULIN 6.9 02/04/2018   INSULIN 5.8 10/09/2017   Lab Results  Component Value Date   TSH 1.10 09/05/2019   Lab Results  Component Value Date   CHOL 192 09/05/2019   HDL 59 09/05/2019   LDLCALC 118 (H) 09/05/2019   TRIG 61 09/05/2019   CHOLHDL 3.3 09/05/2019   Lab Results  Component Value Date   WBC 7.3 01/28/2020   HGB 11.5 (L) 01/28/2020   HCT 34.9 (L) 01/28/2020   MCV 81.2 01/28/2020   PLT  213 01/28/2020   No results found for: IRON, TIBC, FERRITIN  Attestation Statements:   Reviewed by clinician on day of visit: allergies, medications, problem list, medical history, surgical history, family history, social history, and previous encounter notes.  I, 01/30/2020, am acting as Marianna Payment for Energy manager, NP-C   I have reviewed the above documentation for accuracy and completeness, and I agree with the above. -  Cheris Tweten d. Lawan Nanez, NP-C

## 2020-04-07 LAB — COMPREHENSIVE METABOLIC PANEL
ALT: 39 IU/L — ABNORMAL HIGH (ref 0–32)
AST: 27 IU/L (ref 0–40)
Albumin/Globulin Ratio: 1.4 (ref 1.2–2.2)
Albumin: 3.7 g/dL — ABNORMAL LOW (ref 3.8–4.8)
Alkaline Phosphatase: 39 IU/L — ABNORMAL LOW (ref 44–121)
BUN/Creatinine Ratio: 12 (ref 9–23)
BUN: 8 mg/dL (ref 6–20)
Bilirubin Total: 0.3 mg/dL (ref 0.0–1.2)
CO2: 22 mmol/L (ref 20–29)
Calcium: 9 mg/dL (ref 8.7–10.2)
Chloride: 104 mmol/L (ref 96–106)
Creatinine, Ser: 0.68 mg/dL (ref 0.57–1.00)
GFR calc Af Amer: 130 mL/min/{1.73_m2} (ref >59)
GFR calc non Af Amer: 113 mL/min/{1.73_m2} (ref >59)
Globulin, Total: 2.6 g/dL (ref 1.5–4.5)
Glucose: 85 mg/dL (ref 65–99)
Potassium: 4.4 mmol/L (ref 3.5–5.2)
Sodium: 138 mmol/L (ref 134–144)
Total Protein: 6.3 g/dL (ref 6.0–8.5)

## 2020-04-07 LAB — LIPID PANEL
Chol/HDL Ratio: 3 ratio (ref 0.0–4.4)
Cholesterol, Total: 245 mg/dL — ABNORMAL HIGH (ref 100–199)
HDL: 82 mg/dL (ref >39)
LDL Chol Calc (NIH): 143 mg/dL — ABNORMAL HIGH (ref 0–99)
Triglycerides: 118 mg/dL (ref 0–149)
VLDL Cholesterol Cal: 20 mg/dL (ref 5–40)

## 2020-04-07 LAB — VITAMIN D 25 HYDROXY (VIT D DEFICIENCY, FRACTURES): Vit D, 25-Hydroxy: 30.7 ng/mL (ref 30.0–100.0)

## 2020-04-20 ENCOUNTER — Ambulatory Visit (INDEPENDENT_AMBULATORY_CARE_PROVIDER_SITE_OTHER): Payer: 59 | Admitting: Adult Health

## 2020-04-20 ENCOUNTER — Other Ambulatory Visit: Payer: Self-pay

## 2020-04-20 ENCOUNTER — Encounter (INDEPENDENT_AMBULATORY_CARE_PROVIDER_SITE_OTHER): Payer: Self-pay | Admitting: Adult Health

## 2020-04-20 VITALS — BP 127/59 | HR 94 | Temp 98.4°F | Ht 69.0 in | Wt 235.0 lb

## 2020-04-20 DIAGNOSIS — E559 Vitamin D deficiency, unspecified: Secondary | ICD-10-CM

## 2020-04-20 DIAGNOSIS — E782 Mixed hyperlipidemia: Secondary | ICD-10-CM

## 2020-04-20 DIAGNOSIS — Z6834 Body mass index (BMI) 34.0-34.9, adult: Secondary | ICD-10-CM

## 2020-04-20 DIAGNOSIS — E669 Obesity, unspecified: Secondary | ICD-10-CM

## 2020-04-20 NOTE — Progress Notes (Signed)
Chief Complaint:   OBESITY Jamie Bean is here to discuss her progress with her obesity treatment plan along with follow-up of her obesity related diagnoses. Jamie Bean is following a lower carbohydrate, vegetable and lean protein rich diet plan andl PC/[place] and states she is following her eating plan approximately 80% of the time. Jamie Bean states she is exercising 0 minutes 0 times per week.  Today's visit was #: 26 Starting weight: 210 lbs Starting date: 10/09/2017 Today's weight: 235 lbs Today's date: 04/20/2020 Total lbs lost to date: 0 Total lbs lost since last in-office visit: 2  Interim History: Jamie Bean traveled to [place] for Thanksgiving and celebrated with [employer] She reports stable energy levels with very infrequent periods of nausea without vomiting. She is [redacted] weeks pregnant. Her OB/GYN recently ran a CBC which showed iron deficiency anemia and she was started on oral iron supplements.  Subjective:   Vitamin D deficiency. Eldred is taking prenatal vitamins; Ergocalciferol is not appropriate due to pregnancy. Vitamin D level on 04/06/2020 was 30.7. Labs were discussed with the patient today.    Ref. Range 04/06/2020 08:37  Vitamin D, 25-Hydroxy Latest Ref Range: 30.0 - 100.0 ng/mL 30.7   Mixed hyperlipidemia. Jamie Bean has hyperlipidemia and has been trying to improve her cholesterol levels with intensive lifestyle modification including a low saturated fat diet, exercise and weight loss. She denies any chest pain, claudication or myalgias. Lipid panel on 04/06/2020 showed total cholesterol 245, HDL excellent at 82, triglycerides 118, and LDL 143. ASCVD risk 0.2% (adjusted for age). Labs were discussed with the patient today.   Lab Results  Component Value Date   ALT 39 (H) 04/06/2020   AST 27 04/06/2020   ALKPHOS 39 (L) 04/06/2020   BILITOT 0.3 04/06/2020   Lab Results  Component Value Date   CHOL 245 (H) 04/06/2020   HDL 82 04/06/2020   LDLCALC 143 (H)  04/06/2020   TRIG 118 04/06/2020   CHOLHDL 3.0 04/06/2020   Assessment/Plan:   Vitamin D deficiency. Low Vitamin D level contributes to fatigue and are associated with obesity, breast, and colon cancer. She agrees to continue to take prenatal vitamins as directed and will follow-up for routine testing of Vitamin D, at least 2-3 times per year to avoid over-replacement.  Mixed hyperlipidemia. Cardiovascular risk and specific lipid/LDL goals reviewed.  We discussed several lifestyle modifications today and Jamie Bean will continue to work on diet, exercise and weight loss efforts. Orders and follow up as documented in patient record. She will decrease saturated fat in her diet.  Counseling Intensive lifestyle modifications are the first line treatment for this issue. . Dietary changes: Increase soluble fiber. Decrease simple carbohydrates. . Exercise changes: Moderate to vigorous-intensity aerobic activity 150 minutes per week if tolerated. . Lipid-lowering medications: see documented in medical record.  Class 1 obesity with serious comorbidity and body mass index (BMI) of 34.0 to 34.9 in adult, unspecified obesity type.  Jamie Bean is currently in the action stage of change. As such, her goal is to continue with weight loss efforts. She has agreed to following a lower carbohydrate, vegetable and lean protein rich diet plan and PC/[place].   Exercise goals: No exercise has been prescribed at this time.  Behavioral modification strategies: increasing lean protein intake, increasing water intake, no skipping meals, meal planning and cooking strategies and planning for success.  Jamie Bean has agreed to follow-up with our clinic in 3 weeks. She was informed of the importance of frequent follow-up visits to maximize  her success with intensive lifestyle modifications for her multiple health conditions.   Objective:   Blood pressure (!) 127/59, pulse 94, temperature 98.4 F (36.9 C), height 5\' 9"  (1.753  m), weight 235 lb (106.6 kg), last menstrual period 10/12/2019, SpO2 96 %. Body mass index is 34.7 kg/m.  General: Cooperative, alert, well developed, in no acute distress. HEENT: Conjunctivae and lids unremarkable. Cardiovascular: Regular rhythm.  Lungs: Normal work of breathing. Neurologic: No focal deficits.   Lab Results  Component Value Date   CREATININE 0.68 04/06/2020   BUN 8 04/06/2020   NA 138 04/06/2020   K 4.4 04/06/2020   CL 104 04/06/2020   CO2 22 04/06/2020   Lab Results  Component Value Date   ALT 39 (H) 04/06/2020   AST 27 04/06/2020   ALKPHOS 39 (L) 04/06/2020   BILITOT 0.3 04/06/2020   Lab Results  Component Value Date   HGBA1C 5.0 09/05/2019   HGBA1C 5.2 07/01/2018   HGBA1C 5.4 02/04/2018   HGBA1C 5.5 10/09/2017   HGBA1C 5.4 08/31/2017   Lab Results  Component Value Date   INSULIN 7.3 07/01/2018   INSULIN 6.9 02/04/2018   INSULIN 5.8 10/09/2017   Lab Results  Component Value Date   TSH 1.10 09/05/2019   Lab Results  Component Value Date   CHOL 245 (H) 04/06/2020   HDL 82 04/06/2020   LDLCALC 143 (H) 04/06/2020   TRIG 118 04/06/2020   CHOLHDL 3.0 04/06/2020   Lab Results  Component Value Date   WBC 7.3 01/28/2020   HGB 11.5 (L) 01/28/2020   HCT 34.9 (L) 01/28/2020   MCV 81.2 01/28/2020   PLT 213 01/28/2020   No results found for: IRON, TIBC, FERRITIN  Attestation Statements:   Reviewed by clinician on day of visit: allergies, medications, problem list, medical history, surgical history, family history, social history, and previous encounter notes.  Time spent on visit including pre-visit chart review and post-visit charting and care was 35 minutes.   I, 01/30/2020, am acting as Marianna Payment for Energy manager, NP-C   I have reviewed the above documentation for accuracy and completeness, and I agree with the above. -  Zayyan Mullen d. Mumin Denomme, NP-C

## 2020-05-08 NOTE — L&D Delivery Note (Signed)
Delivery Note Labor onset: 08/02/2020  Labor Onset Time: 1605 Complete dilation at 11:20 PM  Onset of pushing at 1038pm FHR second stage Cat 2 Analgesia/Anesthesia intrapartum: epidural  Guided pushing with maternal urge. Delivery of a viable female at 68. Fetal head delivered in LOA position with compound lt hand.   Nuchal cord: N/A. Anterior shoulder delivered per CNM and FOB delivered body of infant. Infant placed on maternal abd, dried, and tactile stim. Spontaneous cry.  Short cord noted. Cord double clamped after pulsation ceased and cut by FOB, Doug.  NICU present for birth due to fetal tachycardia, decreased variability and possible chorioamnionitis.   Cord blood sample collected. Arterial cord blood sample collected: N/A  Placenta delivered Duncan via Binnie Kand maneuver,  intact, with 3 VC. Marginal cord and velamentous cord was noted. Placenta also had calcifications.   Placenta to Pathology. Uterine tone firm, bleeding minimal  Left periurethral laceration identified.  Anesthesia: epidural Repair: 4-0 vicryl QBL/EBL (mL): 200 Complications: Possible chorioamnionitis  APGAR: APGAR (1 MIN): 8   APGAR (5 MINS): 9   APGAR (10 MINS):   Mom to postpartum.  Baby to Couplet care / Skin to Skin. Dr Earlene Plater aware of pt status and POC.  Gerhard Munch Cowen Pesqueira MSN, CNM 08/04/2020, 1:23 AM

## 2020-05-12 ENCOUNTER — Encounter (INDEPENDENT_AMBULATORY_CARE_PROVIDER_SITE_OTHER): Payer: Self-pay | Admitting: Adult Health

## 2020-05-12 ENCOUNTER — Ambulatory Visit (INDEPENDENT_AMBULATORY_CARE_PROVIDER_SITE_OTHER): Payer: 59 | Admitting: Adult Health

## 2020-05-12 ENCOUNTER — Other Ambulatory Visit: Payer: Self-pay

## 2020-05-12 VITALS — BP 120/85 | HR 82 | Temp 98.2°F | Ht 69.0 in | Wt 237.0 lb

## 2020-05-12 DIAGNOSIS — D508 Other iron deficiency anemias: Secondary | ICD-10-CM

## 2020-05-12 DIAGNOSIS — Z6835 Body mass index (BMI) 35.0-35.9, adult: Secondary | ICD-10-CM | POA: Diagnosis not present

## 2020-05-16 NOTE — Progress Notes (Signed)
Chief Complaint:   OBESITY Jamie Bean is here to discuss her progress with her obesity treatment plan along with follow-up of her obesity related diagnoses. Jamie Bean is on practicing portion control and making smarter food choices, such as increasing vegetables and decreasing simple carbohydrates and following a lower carbohydrate, vegetable and lean protein rich diet plan and states she is following her eating plan approximately 70% of the time. Jamie Bean states she is exercising 0 minutes 0 times per week.  Today's visit was #: 27 Starting weight: 210 lbs Starting date: 10/09/2017 Today's weight: 237 lbs Today's date: 05/12/2020 Total lbs lost to date: 0 lbs Total lbs lost since last in-office visit: 0 lbs  Interim History: Jamie Bean is currently [redacted] weeks pregnant. She will complete gestational diabetic testing next week. She has been following the PC/[place] meal plan and enjoying a bowl of Captain Crunch cereal most mornings of the week.  She denies current GI upset.  Subjective:   1. Other iron deficiency anemia Jamie Bean's OB/GYN is treating anemia with ferrous sulfate 325 mg with breakfast. She has experienced occasional constipation with oral iron supplement.   Assessment/Plan:   1. Other iron deficiency anemia Remain well hydrated and increase fiber intake.  Continue to take oral iron with a meal.   2. Class 2 severe obesity with serious comorbidity and body mass index (BMI) of 35.0 to 35.9 in adult, unspecified obesity type (HCC) Jamie Bean is currently in the action stage of change. As such, her goal is to continue with weight loss efforts. She has agreed to practicing portion control and making smarter food choices, such as increasing vegetables and decreasing simple carbohydrates.   Exercise goals: Increase walking as tolerated.  Behavioral modification strategies: increasing lean protein intake, decreasing simple carbohydrates, increasing water intake, increasing high fiber foods  and no skipping meals.  Jamie Bean has agreed to follow-up with our clinic in 2 weeks. She was informed of the importance of frequent follow-up visits to maximize her success with intensive lifestyle modifications for her multiple health conditions.   Objective:   Blood pressure 120/85, pulse 82, temperature 98.2 F (36.8 C), height 5\' 9"  (1.753 m), weight 237 lb (107.5 kg), last menstrual period 10/12/2019, SpO2 97 %. Body mass index is 35 kg/m.  General: Cooperative, alert, well developed, in no acute distress. HEENT: Conjunctivae and lids unremarkable. Cardiovascular: Regular rhythm.  Lungs: Normal work of breathing. Neurologic: No focal deficits.   Lab Results  Component Value Date   CREATININE 0.68 04/06/2020   BUN 8 04/06/2020   NA 138 04/06/2020   K 4.4 04/06/2020   CL 104 04/06/2020   CO2 22 04/06/2020   Lab Results  Component Value Date   ALT 39 (H) 04/06/2020   AST 27 04/06/2020   ALKPHOS 39 (L) 04/06/2020   BILITOT 0.3 04/06/2020   Lab Results  Component Value Date   HGBA1C 5.0 09/05/2019   HGBA1C 5.2 07/01/2018   HGBA1C 5.4 02/04/2018   HGBA1C 5.5 10/09/2017   HGBA1C 5.4 08/31/2017   Lab Results  Component Value Date   INSULIN 7.3 07/01/2018   INSULIN 6.9 02/04/2018   INSULIN 5.8 10/09/2017   Lab Results  Component Value Date   TSH 1.10 09/05/2019   Lab Results  Component Value Date   CHOL 245 (H) 04/06/2020   HDL 82 04/06/2020   LDLCALC 143 (H) 04/06/2020   TRIG 118 04/06/2020   CHOLHDL 3.0 04/06/2020   Lab Results  Component Value Date   WBC 7.3  01/28/2020   HGB 11.5 (L) 01/28/2020   HCT 34.9 (L) 01/28/2020   MCV 81.2 01/28/2020   PLT 213 01/28/2020   No results found for: IRON, TIBC, FERRITIN  Attestation Statements:   Reviewed by clinician on day of visit: allergies, medications, problem list, medical history, surgical history, family history, social history, and previous encounter notes.  Time spent on visit including pre-visit  chart review and post-visit care and charting was 25 minutes.   Edmund Hilda, am acting as Energy manager for William Hamburger, NP.  I have reviewed the above documentation for accuracy and completeness, and I agree with the above. -  Aiyanna Awtrey d. Chevelle Durr, NP-C

## 2020-05-17 DIAGNOSIS — D649 Anemia, unspecified: Secondary | ICD-10-CM | POA: Insufficient documentation

## 2020-05-25 ENCOUNTER — Other Ambulatory Visit: Payer: Self-pay

## 2020-05-25 ENCOUNTER — Telehealth (INDEPENDENT_AMBULATORY_CARE_PROVIDER_SITE_OTHER): Payer: 59 | Admitting: Adult Health

## 2020-05-25 ENCOUNTER — Encounter (INDEPENDENT_AMBULATORY_CARE_PROVIDER_SITE_OTHER): Payer: Self-pay

## 2020-05-25 ENCOUNTER — Encounter (INDEPENDENT_AMBULATORY_CARE_PROVIDER_SITE_OTHER): Payer: Self-pay | Admitting: Adult Health

## 2020-05-25 VITALS — BP 118/72 | Ht 69.0 in | Wt 242.0 lb

## 2020-05-25 DIAGNOSIS — Z6835 Body mass index (BMI) 35.0-35.9, adult: Secondary | ICD-10-CM

## 2020-05-25 DIAGNOSIS — D508 Other iron deficiency anemias: Secondary | ICD-10-CM

## 2020-05-27 NOTE — Progress Notes (Signed)
TeleHealth Visit:  Due to the COVID-19 pandemic, this visit was completed with telemedicine (audio/video) technology to reduce patient and provider exposure as well as to preserve personal protective equipment.   Camylle has verbally consented to this TeleHealth visit. The patient is located at home, the provider is located at the [facility] office. The participants in this visit include the listed provider and patient. The visit was conducted today via video.  Chief Complaint: OBESITY Cheral is here to discuss her progress with her obesity treatment plan along with follow-up of her obesity related diagnoses. Errin is on practicing portion control and making smarter food choices, such as increasing vegetables and decreasing simple carbohydrates and states she is following her eating plan approximately  75% of the time. Taeya states she is stretching 5-15 minutes 2-3 times per week.  Today's visit was #: 28 Starting weight: 210 lbs Starting date: 10/09/2017  Interim History: Janya had an OB visit today. BP 118/72 and weight 242 lbs. She completed gestational diabetes screening today. She is 28 weeks and 2 days pregnant. She has recent cravings of cheese fries and apple pie. She was scheduled for Pfizer COVID-19 booster vaccine yesterday but was canceled due to inclement weather.   Subjective:   1. Other iron deficiency anemia Aliah is taking ferrous sulfate 325 mg daily and tolerating it well. She denies excessive fatigue.   CBC Latest Ref Rng & Units 01/28/2020 11/03/2019 09/05/2019  WBC 4.0 - 10.5 K/uL 7.3 6.3 6.9  Hemoglobin 12.0 - 15.0 g/dL 11.5(L) 12.1 12.2  Hematocrit 36.0 - 46.0 % 34.9(L) 36.5 38.8  Platelets 150 - 400 K/uL 213 176.0 117(L)   No results found for: IRON, TIBC, FERRITIN Lab Results  Component Value Date   VITAMINB12 881 10/09/2017    Assessment/Plan:   1. Other iron deficiency anemia OB/GYN rechecked CBC today. Increase water  intake.  2. Class 2 severe obesity with serious comorbidity and body mass index (BMI) of 35.0 to 35.9 in adult, unspecified obesity type (HCC) Shakeya is currently in the action stage of change. As such, her goal is to continue with weight loss efforts. She has agreed to practicing portion control and making smarter food choices, such as increasing vegetables and decreasing simple carbohydrates.   Continue to increase daily water intake.  Exercise goals: Stretching/Dancing program 5-15 minutes 2-3 days a week  Behavioral modification strategies: increasing lean protein intake, increasing water intake, better snacking choices and planning for success.  Quinnie has agreed to follow-up with our clinic in 2 weeks. She was informed of the importance of frequent follow-up visits to maximize her success with intensive lifestyle modifications for her multiple health conditions.  Objective:   VITALS: Per patient if applicable, see vitals. GENERAL: Alert and in no acute distress. CARDIOPULMONARY: No increased WOB. Speaking in clear sentences.  PSYCH: Pleasant and cooperative. Speech normal rate and rhythm. Affect is appropriate. Insight and judgement are appropriate. Attention is focused, linear, and appropriate.  NEURO: Oriented as arrived to appointment on time with no prompting.   Lab Results  Component Value Date   CREATININE 0.68 04/06/2020   BUN 8 04/06/2020   NA 138 04/06/2020   K 4.4 04/06/2020   CL 104 04/06/2020   CO2 22 04/06/2020   Lab Results  Component Value Date   ALT 39 (H) 04/06/2020   AST 27 04/06/2020   ALKPHOS 39 (L) 04/06/2020   BILITOT 0.3 04/06/2020   Lab Results  Component Value Date  HGBA1C 5.0 09/05/2019   HGBA1C 5.2 07/01/2018   HGBA1C 5.4 02/04/2018   HGBA1C 5.5 10/09/2017   HGBA1C 5.4 08/31/2017   Lab Results  Component Value Date   INSULIN 7.3 07/01/2018   INSULIN 6.9 02/04/2018   INSULIN 5.8 10/09/2017   Lab Results  Component Value Date    TSH 1.10 09/05/2019   Lab Results  Component Value Date   CHOL 245 (H) 04/06/2020   HDL 82 04/06/2020   LDLCALC 143 (H) 04/06/2020   TRIG 118 04/06/2020   CHOLHDL 3.0 04/06/2020   Lab Results  Component Value Date   WBC 7.3 01/28/2020   HGB 11.5 (L) 01/28/2020   HCT 34.9 (L) 01/28/2020   MCV 81.2 01/28/2020   PLT 213 01/28/2020   No results found for: IRON, TIBC, FERRITIN  Attestation Statements:   Reviewed by clinician on day of visit: allergies, medications, problem list, medical history, surgical history, family history, social history, and previous encounter notes.  Time spent on visit including pre-visit chart review and post-visit charting and care was 32 minutes.   Edmund Hilda, am acting as Energy manager for William Hamburger, NP.  I have reviewed the above documentation for accuracy and completeness, and I agree with the above. - Deke Tilghman d. Aldean Suddeth, NP-C

## 2020-06-08 ENCOUNTER — Encounter (INDEPENDENT_AMBULATORY_CARE_PROVIDER_SITE_OTHER): Payer: Self-pay | Admitting: Adult Health

## 2020-06-08 ENCOUNTER — Other Ambulatory Visit: Payer: Self-pay

## 2020-06-08 ENCOUNTER — Ambulatory Visit (INDEPENDENT_AMBULATORY_CARE_PROVIDER_SITE_OTHER): Payer: 59 | Admitting: Adult Health

## 2020-06-08 VITALS — BP 116/70 | HR 88 | Temp 98.1°F | Ht 69.0 in | Wt 241.0 lb

## 2020-06-08 DIAGNOSIS — K5900 Constipation, unspecified: Secondary | ICD-10-CM | POA: Diagnosis not present

## 2020-06-08 DIAGNOSIS — Z6835 Body mass index (BMI) 35.0-35.9, adult: Secondary | ICD-10-CM

## 2020-06-09 NOTE — Progress Notes (Signed)
Chief Complaint:   OBESITY Jamie Bean is here to discuss her progress with her obesity treatment plan along with follow-up of her obesity related diagnoses. Jamie Bean is on following a lower carbohydrate, vegetable and lean protein rich diet plan and states she is following her eating plan approximately 50% of the time. Jamie Bean states she is yoga 15 minutes 1 times per week.  Today's visit was #: 29 Starting weight: 210 lbs Starting date: 10/09/2017 Today's weight: 241 lbs Today's date: 06/08/2020 Total lbs lost to date: 0 Total lbs lost since last in-office visit: 0  Interim History: When Ms. Beumer re-started the program 09/09/2019-weight 229 with BMI 33.82. Goals for healthy weight gain during third trimester 8-10 lbs. Pt current weight today is 241 lbs with BMI 35.6. She denies current GI upset. She is [redacted] weeks pregnant, due 08/15/2020. She has been following PC/[place], trying to limit simple CHO/intake.  Subjective:   1. Constipation, unspecified constipation type Pt reports daily, sometimes BID bowel movements.   Assessment/Plan:   1. Constipation, unspecified constipation type Remain well hydrated, consume plenty of fiber, and continue yoga.  2. Class 2 severe obesity with serious comorbidity and body mass index (BMI) of 35.0 to 35.9 in adult, unspecified obesity type (HCC) Jamie Bean is currently in the action stage of change. As such, her goal is to continue with weight loss efforts. She has agreed to following a lower carbohydrate, vegetable and lean protein rich diet plan.   Exercise goals: As is  Behavioral modification strategies: increasing water intake, meal planning and cooking strategies and planning for success.  Jamie Bean has agreed to follow-up with our clinic in 2 weeks. She was informed of the importance of frequent follow-up visits to maximize her success with intensive lifestyle modifications for her multiple health conditions.   Objective:   Blood pressure  116/70, pulse 88, temperature 98.1 F (36.7 C), height 5\' 9"  (1.753 m), weight 241 lb (109.3 kg), last menstrual period 10/12/2019, SpO2 96 %. Body mass index is 35.59 kg/m.  General: Cooperative, alert, well developed, in no acute distress. HEENT: Conjunctivae and lids unremarkable. Cardiovascular: Regular rhythm.  Lungs: Normal work of breathing. Neurologic: No focal deficits.   Lab Results  Component Value Date   CREATININE 0.68 04/06/2020   BUN 8 04/06/2020   NA 138 04/06/2020   K 4.4 04/06/2020   CL 104 04/06/2020   CO2 22 04/06/2020   Lab Results  Component Value Date   ALT 39 (H) 04/06/2020   AST 27 04/06/2020   ALKPHOS 39 (L) 04/06/2020   BILITOT 0.3 04/06/2020   Lab Results  Component Value Date   HGBA1C 5.0 09/05/2019   HGBA1C 5.2 07/01/2018   HGBA1C 5.4 02/04/2018   HGBA1C 5.5 10/09/2017   HGBA1C 5.4 08/31/2017   Lab Results  Component Value Date   INSULIN 7.3 07/01/2018   INSULIN 6.9 02/04/2018   INSULIN 5.8 10/09/2017   Lab Results  Component Value Date   TSH 1.10 09/05/2019   Lab Results  Component Value Date   CHOL 245 (H) 04/06/2020   HDL 82 04/06/2020   LDLCALC 143 (H) 04/06/2020   TRIG 118 04/06/2020   CHOLHDL 3.0 04/06/2020   Lab Results  Component Value Date   WBC 7.3 01/28/2020   HGB 11.5 (L) 01/28/2020   HCT 34.9 (L) 01/28/2020   MCV 81.2 01/28/2020   PLT 213 01/28/2020   No results found for: IRON, TIBC, FERRITIN  Attestation Statements:   Reviewed by clinician on  day of visit: allergies, medications, problem list, medical history, surgical history, family history, social history, and previous encounter notes.  Time spent on visit including pre-visit chart review and post-visit care and charting was 27 minutes.   Edmund Hilda, am acting as Energy manager for William Hamburger, NP.  I have reviewed the above documentation for accuracy and completeness, and I agree with the above. -  Senita Corredor d. Josanne Boerema, NP-C

## 2020-06-22 ENCOUNTER — Ambulatory Visit (INDEPENDENT_AMBULATORY_CARE_PROVIDER_SITE_OTHER): Payer: 59 | Admitting: Adult Health

## 2020-07-06 ENCOUNTER — Encounter (INDEPENDENT_AMBULATORY_CARE_PROVIDER_SITE_OTHER): Payer: Self-pay | Admitting: Family Medicine

## 2020-07-06 ENCOUNTER — Other Ambulatory Visit: Payer: Self-pay

## 2020-07-06 ENCOUNTER — Ambulatory Visit (INDEPENDENT_AMBULATORY_CARE_PROVIDER_SITE_OTHER): Payer: 59 | Admitting: Family Medicine

## 2020-07-06 VITALS — BP 106/73 | HR 75 | Temp 98.8°F | Ht 69.0 in | Wt 245.0 lb

## 2020-07-06 DIAGNOSIS — E559 Vitamin D deficiency, unspecified: Secondary | ICD-10-CM

## 2020-07-06 DIAGNOSIS — D508 Other iron deficiency anemias: Secondary | ICD-10-CM

## 2020-07-06 DIAGNOSIS — R7401 Elevation of levels of liver transaminase levels: Secondary | ICD-10-CM | POA: Insufficient documentation

## 2020-07-06 DIAGNOSIS — Z9189 Other specified personal risk factors, not elsewhere classified: Secondary | ICD-10-CM

## 2020-07-06 DIAGNOSIS — Z6836 Body mass index (BMI) 36.0-36.9, adult: Secondary | ICD-10-CM

## 2020-07-07 DIAGNOSIS — Z9189 Other specified personal risk factors, not elsewhere classified: Secondary | ICD-10-CM | POA: Insufficient documentation

## 2020-07-07 NOTE — Progress Notes (Signed)
Chief Complaint:   OBESITY Jamie Bean is here to discuss her progress with her obesity treatment plan along with follow-up of her obesity related diagnoses.   Today's visit was #: 30 Starting weight: 210 lbs Starting date: 10/09/2017 Today's weight: 245 lbs Today's date: 07/06/2020 Total lbs lost to date: 35 lbs Body mass index is 36.18 kg/m.   Interim History:  This is Jamie Bean's first office visit with me.  She is [redacted] weeks pregnant.  Jamie Bean is here for a follow up office visit.  she is following the meal plan without concern or issues.  Patient's meal and food recall appears to be accurate and consistent with what is on the plan.  When on plan, her hunger and cravings are well controlled.    Current Meal Plan: following a lower carbohydrate, vegetable and lean protein rich diet plan for 50% of the time.  Current Exercise Plan: None.  Assessment/Plan:    1. Vitamin D deficiency Not at goal. Current vitamin D is 30.7, tested on 04/06/2020. Optimal goal > 50 ng/dL.  Due to being pregnant, she is only taking a prenatal vitamin at this time.  Plan:  Recommend that after she gives birth, she replaces her prenatal vitamin with ergocalciferol or OTC vitamin D to a goal of 50-70 range.  2. Elevated alanine aminotransferase (ALT) level ALT was 39 on 04/06/2020.  She is not on any medications or Tylenol, etc.  Elevated liver transaminases with an ALT predominance combined with obesity and insulin resistance is characteristic, but not diagnostic of non-alcoholic fatty liver disease (NAFLD). NAFLD is the 2nd leading cause of liver transplant in adults. Treatment includes weight loss, elimination of sweet drinks, including juice, avoidance of high fructose corn syrup, and exercise. As always, avoiding alcohol consumption is important.  Plan:  Continue with prudent nutritional plan and weight loss in postpartum period.  Increase exercise at that time as well.  Lab Results  Component  Value Date   ALT 39 (H) 04/06/2020   AST 27 04/06/2020   ALKPHOS 39 (L) 04/06/2020   BILITOT 0.3 04/06/2020   3. Other iron deficiency anemia Jamie Bean is taking ferrous sulfate 325 mg daily as well as a prenatal vitamin.  Plan:  Being followed currently by her OBGYN.  Will need intensive monitoring postpartum.  Nutrition: Iron-rich foods include dark leafy greens, red and white meats, eggs, seafood, and beans.  Certain foods and drinks prevent your body from absorbing iron properly. Avoid eating these foods in the same meal as iron-rich foods or with iron supplements. These foods include: coffee, black tea, and red wine; milk, dairy products, and foods that are high in calcium; beans and soybeans; whole grains. Constipation can be a side effect of iron supplementation. Increased water and fiber intake are helpful. Water goal: > 2 liters/day. Fiber goal: > 25 grams/day.  4. At risk for dehydration Jamie Bean is at higher than average risk of dehydration.  Jamie Bean was given more than 8 minutes of proper hydration counseling today.  We discussed the signs and symptoms of dehydration, some of which may include muscle cramping, constipation or even orthostatic symptoms.  Counseling on the prevention of dehydration was also provided today.  Jamie Bean is at risk for dehydration due to weight loss, lifestyle and behavorial habits and possibly due to taking certain medication(s).  She was encouraged to adequately hydrate and monitor fluid status to avoid dehydration as well as weight loss plateaus.  Unless pre-existing renal or cardiopulmonary conditions exist, in  which patient was told to limit their fluid intake, I recommended roughly one half of their weight in pounds to be the approximate ounces of non-caloric, non-caffeinated beverages they should drink per day; including more if they are engaging in exercise.  5. Class 2 severe obesity with serious comorbidity and body mass index (BMI) of 36.0 to 36.9 in  adult, unspecified obesity type Jamie Bean)  Course: Jamie Bean is currently in the action stage of change. As such, her goal is to continue with weight loss efforts.   Nutrition goals: She has agreed to following a lower carbohydrate, vegetable and lean protein rich diet plan.   Exercise goals: Start walking for 15 minutes per day.  Behavioral modification strategies: increasing lean protein intake, decreasing simple carbohydrates and increasing water intake.  Jamie Bean has agreed to follow-up with our clinic in 3 weeks with an APP. She was informed of the importance of frequent follow-up visits to maximize her success with intensive lifestyle modifications for her multiple health conditions.   Objective:   Blood pressure 106/73, pulse 75, temperature 98.8 F (37.1 C), height 5\' 9"  (1.753 m), weight 245 lb (111.1 kg), last menstrual period 10/12/2019, SpO2 97 %. Body mass index is 36.18 kg/m.  General: Cooperative, alert, well developed, in no acute distress. HEENT: Conjunctivae and lids unremarkable. Cardiovascular: Regular rhythm.  Lungs: Normal work of breathing. Neurologic: No focal deficits.   Lab Results  Component Value Date   CREATININE 0.68 04/06/2020   BUN 8 04/06/2020   NA 138 04/06/2020   K 4.4 04/06/2020   CL 104 04/06/2020   CO2 22 04/06/2020   Lab Results  Component Value Date   ALT 39 (H) 04/06/2020   AST 27 04/06/2020   ALKPHOS 39 (L) 04/06/2020   BILITOT 0.3 04/06/2020   Lab Results  Component Value Date   HGBA1C 5.0 09/05/2019   HGBA1C 5.2 07/01/2018   HGBA1C 5.4 02/04/2018   HGBA1C 5.5 10/09/2017   HGBA1C 5.4 08/31/2017   Lab Results  Component Value Date   INSULIN 7.3 07/01/2018   INSULIN 6.9 02/04/2018   INSULIN 5.8 10/09/2017   Lab Results  Component Value Date   TSH 1.10 09/05/2019   Lab Results  Component Value Date   CHOL 245 (H) 04/06/2020   HDL 82 04/06/2020   LDLCALC 143 (H) 04/06/2020   TRIG 118 04/06/2020   CHOLHDL 3.0 04/06/2020    Lab Results  Component Value Date   WBC 7.3 01/28/2020   HGB 11.5 (L) 01/28/2020   HCT 34.9 (L) 01/28/2020   MCV 81.2 01/28/2020   PLT 213 01/28/2020   Attestation Statements:   Reviewed by clinician on day of visit: allergies, medications, problem list, medical history, surgical history, family history, social history, and previous encounter notes.  I, 01/30/2020, CMA, am acting as Insurance claims handler for Energy manager, DO.  I have reviewed the above documentation for accuracy and completeness, and I agree with the above. [employer], D.O.  The 21st Century Cures Act was signed into law in 2016 which includes the topic of electronic health records.  This provides immediate access to information in MyChart.  This includes consultation notes, operative notes, office notes, lab results and pathology reports.  If you have any questions about what you read please let 2017 know at your next visit so we can discuss your concerns and take corrective action if need be.  We are right here with you.

## 2020-07-23 ENCOUNTER — Encounter (HOSPITAL_COMMUNITY): Payer: Self-pay | Admitting: Obstetrics & Gynecology

## 2020-07-23 ENCOUNTER — Other Ambulatory Visit: Payer: Self-pay

## 2020-07-23 ENCOUNTER — Inpatient Hospital Stay (HOSPITAL_COMMUNITY)
Admission: AD | Admit: 2020-07-23 | Discharge: 2020-07-23 | Disposition: A | Discharge status: Home / Self Care | Payer: Managed Care, Other (non HMO) | Attending: Obstetrics & Gynecology | Admitting: Obstetrics & Gynecology

## 2020-07-23 DIAGNOSIS — Z882 Allergy status to sulfonamides status: Secondary | ICD-10-CM | POA: Diagnosis not present

## 2020-07-23 DIAGNOSIS — R519 Headache, unspecified: Secondary | ICD-10-CM | POA: Diagnosis not present

## 2020-07-23 DIAGNOSIS — Z3689 Encounter for other specified antenatal screening: Secondary | ICD-10-CM

## 2020-07-23 DIAGNOSIS — R030 Elevated blood-pressure reading, without diagnosis of hypertension: Secondary | ICD-10-CM | POA: Diagnosis not present

## 2020-07-23 DIAGNOSIS — O26893 Other specified pregnancy related conditions, third trimester: Secondary | ICD-10-CM | POA: Insufficient documentation

## 2020-07-23 DIAGNOSIS — Z3A36 36 weeks gestation of pregnancy: Secondary | ICD-10-CM

## 2020-07-23 DIAGNOSIS — O99891 Other specified diseases and conditions complicating pregnancy: Secondary | ICD-10-CM | POA: Diagnosis not present

## 2020-07-23 DIAGNOSIS — Z7982 Long term (current) use of aspirin: Secondary | ICD-10-CM | POA: Insufficient documentation

## 2020-07-23 LAB — CBC
HCT: 35.6 % — ABNORMAL LOW (ref 36.0–46.0)
Hemoglobin: 11.8 g/dL — ABNORMAL LOW (ref 12.0–15.0)
MCH: 27.6 pg (ref 26.0–34.0)
MCHC: 33.1 g/dL (ref 30.0–36.0)
MCV: 83.4 fL (ref 80.0–100.0)
Platelets: 185 10*3/uL (ref 150–400)
RBC: 4.27 MIL/uL (ref 3.87–5.11)
RDW: 13.2 % (ref 11.5–15.5)
WBC: 7.6 10*3/uL (ref 4.0–10.5)
nRBC: 0 % (ref 0.0–0.2)

## 2020-07-23 LAB — PROTEIN / CREATININE RATIO, URINE
Creatinine, Urine: 93.15 mg/dL
Protein Creatinine Ratio: 0.14 mg/mg{Cre} (ref 0.00–0.15)
Total Protein, Urine: 13 mg/dL

## 2020-07-23 LAB — COMPREHENSIVE METABOLIC PANEL
ALT: 16 U/L (ref 0–44)
AST: 17 U/L (ref 15–41)
Albumin: 2.9 g/dL — ABNORMAL LOW (ref 3.5–5.0)
Alkaline Phosphatase: 75 U/L (ref 38–126)
Anion gap: 8 (ref 5–15)
BUN: 6 mg/dL (ref 6–20)
CO2: 23 mmol/L (ref 22–32)
Calcium: 9.1 mg/dL (ref 8.9–10.3)
Chloride: 103 mmol/L (ref 98–111)
Creatinine, Ser: 0.69 mg/dL (ref 0.44–1.00)
GFR, Estimated: >60 mL/min (ref >60)
Glucose, Bld: 91 mg/dL (ref 70–99)
Potassium: 3.9 mmol/L (ref 3.5–5.1)
Sodium: 134 mmol/L — ABNORMAL LOW (ref 135–145)
Total Bilirubin: 0.8 mg/dL (ref 0.3–1.2)
Total Protein: 6.3 g/dL — ABNORMAL LOW (ref 6.5–8.1)

## 2020-07-23 MED ORDER — ACETAMINOPHEN 500 MG PO TABS
1000.0000 mg | ORAL_TABLET | Freq: Once | ORAL | Status: AC
  Administered 2020-07-23: 1000 mg via ORAL
  Filled 2020-07-23: qty 2

## 2020-07-23 NOTE — MAU Provider Note (Signed)
Chief Complaint:  BP Evaluation   Event Date/Time   First Provider Initiated Contact with Patient 07/23/20 1434     HPI: Jamie Bean is a 35 y.o. G1P0000 at [redacted]w[redacted]d who presents to maternity admissions reporting a mild headache that prompted her to take her BP which was elevated 140s/90s (normally runs 100s/60s on her home cuff). Has not taken anything for her headache today. Has had visual floaters in the past week but none today. Also reports occasional pressure in her left ear + the sound of her heartbeat in that ear. Worst when laying down at night. Denies epigastric or flank pain, nausea/vomiting, decreased fetal movement, vaginal bleeding, leaking of fluid, fever, falls, or recent illness.   Pregnancy Course: Receives care at [facility])  Past Medical History:  Diagnosis Date  . Anemia   . Anxiety   . Arthritis   . Constipation   . Female infertility   . Joint pain   . Kidney problem   . Knee pain   . Palpitations   . UTI (urinary tract infection)   . Vitamin D deficiency    OB History  Gravida Para Term Preterm AB Living  1 0 0 0 0 0  SAB IAB Ectopic Multiple Live Births  0 0 0 0 0    # Outcome Date GA Lbr Len/2nd Weight Sex Delivery Anes PTL Lv  1 Current            Past Surgical History:  Procedure Laterality Date  . INCISE AND DRAIN ABCESS     1994   Family History  Problem Relation Age of Onset  . Renal cancer Maternal Grandmother 70  . Alcohol abuse Maternal Grandmother   . Hyperlipidemia Maternal Grandmother   . Hypertension Maternal Grandmother   . Arthritis Father   . Obesity Father   . Alcohol abuse Maternal Uncle   . Mental illness Maternal Uncle   . Mental illness Paternal Aunt   . Diabetes Paternal Aunt   . Hyperlipidemia Maternal Grandfather   . Hypertension Maternal Grandfather   . Stroke Paternal Grandmother   . Diabetes Paternal Grandmother   . Cancer Paternal Grandfather        lung  . Colon cancer Neg Hx   . Breast  cancer Neg Hx    Social History   Tobacco Use  . Smoking status: Never Smoker  . Smokeless tobacco: Never Used  Vaping Use  . Vaping Use: Never used  Substance Use Topics  . Alcohol use: Not Currently    Comment: occasional  . Drug use: No   Allergies  Allergen Reactions  . Sulfa Antibiotics Hives   Medications Prior to Admission  Medication Sig Dispense Refill Last Dose  . aspirin 81 MG EC tablet Take 81 mg by mouth daily. Swallow whole.   07/22/2020 at 2130  . ferrous sulfate 325 (65 FE) MG tablet Take 325 mg by mouth daily with breakfast.   07/22/2020 at 2130  . Magnesium 400 MG CAPS Take by mouth.   07/22/2020 at 2130  . Prenatal Vit-Fe Fumarate-FA (PRENATAL VITAMIN AND MINERAL PO) Take 1 tablet by mouth daily.   07/22/2020 at 2130  . Probiotic Product (PROBIOTIC PO) Take 1 capsule by mouth daily. Probiotic prenatal due to recent pregnancy   07/22/2020 at 2130  . OVER THE COUNTER MEDICATION       I have reviewed patient's Past Medical Hx, Surgical Hx, Family Hx, Social Hx, medications and allergies.   ROS:  Review of Systems  Constitutional: Negative for fatigue and fever.  HENT: Positive for ear pain (no pain, just pressure and pulse).   Eyes: Positive for visual disturbance (occasional, none now). Negative for photophobia.  Respiratory: Negative for cough and shortness of breath.   Gastrointestinal: Negative for abdominal pain, nausea and vomiting.  Genitourinary: Positive for vaginal discharge (normal white ). Negative for vaginal bleeding.  Neurological: Positive for headaches. Negative for dizziness, syncope and light-headedness.  All other systems reviewed and are negative.  Physical Exam   Patient Vitals for the past 24 hrs:  BP Temp Temp src Pulse Resp SpO2 Height Weight  07/23/20 1516 126/86 -- -- 88 -- 99 % -- --  07/23/20 1501 128/81 -- -- 85 -- 98 % -- --  07/23/20 1431 131/88 -- -- 98 -- 98 % -- --  07/23/20 1425 (!) 123/54 -- -- 89 -- -- -- --  07/23/20  1408 134/87 98.1 F (36.7 C) Oral 80 20 98 % -- --  07/23/20 1358 -- -- -- -- -- -- 5\' 9"  (1.753 m) 254 lb 9.6 oz (115.5 kg)   Constitutional: Well-developed, well-nourished female in no acute distress.  HEENT: Normocephalic, atraumatic. Left tympanic membrane opaque, intact, no bulging or redness Cardiovascular: normal rate & rhythm, no murmur Respiratory: normal effort, lung sounds clear throughout GI: Abd soft, non-tender, gravid appropriate for gestational age. Pos BS x 4 MS: Extremities nontender, no edema, normal ROM Neurologic: Alert and oriented x 4.  GU: no CVA tenderness Pelvic exam deferred  Fetal Tracing: reactive Baseline: 145 Variability: moderate Accelerations: 15x15 Decelerations: none Toco: q4-8 (mild to pt)   Labs: Results for orders placed or performed during the hospital encounter of 07/23/20 (from the past 24 hour(s))  Protein / creatinine ratio, urine     Status: None   Collection Time: 07/23/20  2:42 PM  Result Value Ref Range   Creatinine, Urine 93.15 mg/dL   Total Protein, Urine 13 mg/dL   Protein Creatinine Ratio 0.14 0.00 - 0.15 mg/mg[Cre]  CBC     Status: Abnormal   Collection Time: 07/23/20  3:01 PM  Result Value Ref Range   WBC 7.6 4.0 - 10.5 K/uL   RBC 4.27 3.87 - 5.11 MIL/uL   Hemoglobin 11.8 (L) 12.0 - 15.0 g/dL   HCT 16.1 (L) 09.6 - 04.5 %   MCV 83.4 80.0 - 100.0 fL   MCH 27.6 26.0 - 34.0 pg   MCHC 33.1 30.0 - 36.0 g/dL   RDW 40.9 81.1 - 91.4 %   Platelets 185 150 - 400 K/uL   nRBC 0.0 0.0 - 0.2 %  Comprehensive metabolic panel     Status: Abnormal   Collection Time: 07/23/20  3:01 PM  Result Value Ref Range   Sodium 134 (L) 135 - 145 mmol/L   Potassium 3.9 3.5 - 5.1 mmol/L   Chloride 103 98 - 111 mmol/L   CO2 23 22 - 32 mmol/L   Glucose, Bld 91 70 - 99 mg/dL   BUN 6 6 - 20 mg/dL   Creatinine, Ser 7.82 0.44 - 1.00 mg/dL   Calcium 9.1 8.9 - 95.6 mg/dL   Total Protein 6.3 (L) 6.5 - 8.1 g/dL   Albumin 2.9 (L) 3.5 - 5.0 g/dL   AST  17 15 - 41 U/L   ALT 16 0 - 44 U/L   Alkaline Phosphatase 75 38 - 126 U/L   Total Bilirubin 0.8 0.3 - 1.2 mg/dL   GFR, Estimated >21 >30  mL/min   Anion gap 8 5 - 15   Imaging:  No results found.  MAU Course: Orders Placed This Encounter  Procedures  . CBC  . Comprehensive metabolic panel  . Protein / creatinine ratio, urine  . Discharge patient   Meds ordered this encounter  Medications  . acetaminophen (TYLENOL) tablet 1,000 mg   MDM: Normotensive in MAU Preeclampsia labs normal  Assessment: 1. Elevated blood-pressure reading without diagnosis of hypertension   2. NST (non-stress test) reactive    Plan: Discharge home in stable condition with preeclampsia precautions    Follow-up Information    Ob/Gyn, [place]. Go on 07/26/2020.   Specialty: Obstetrics and Gynecology Why: For BP check - call on Monday and let them know you were seen on Friday afternoon and need a BP check Contact information: [address] [place] [address] [phone]               Allergies as of 07/23/2020      Reactions   Sulfa Antibiotics Hives      Medication List    TAKE these medications   aspirin 81 MG EC tablet Take 81 mg by mouth daily. Swallow whole.   ferrous sulfate 325 (65 FE) MG tablet Take 325 mg by mouth daily with breakfast.   Magnesium 400 MG Caps Take by mouth.   OVER THE COUNTER MEDICATION   PRENATAL VITAMIN AND MINERAL PO Take 1 tablet by mouth daily.   PROBIOTIC PO Take 1 capsule by mouth daily. Probiotic prenatal due to recent pregnancy       Edd Arbour, CNM, MSN, IBCLC Certified Nurse Midwife, [facility]

## 2020-07-23 NOTE — Discharge Instructions (Signed)
How to Take Your Blood Pressure Blood pressure is a measurement of how strongly your blood is pressing against the walls of your arteries. Arteries are blood vessels that carry blood from your heart throughout your body. Your health care provider takes your blood pressure at each office visit. You can also take your own blood pressure at home with a blood pressure monitor. You may need to take your own blood pressure to:  Confirm a diagnosis of high blood pressure (hypertension).  Monitor your blood pressure over time.  Make sure your blood pressure medicine is working. Supplies needed:  Blood pressure monitor.  Dining room chair to sit in.  Table or desk.  Small notebook and pencil or pen. How to prepare To get the most accurate reading, avoid the following for 30 minutes before you check your blood pressure:  Drinking caffeine.  Drinking alcohol.  Eating.  Smoking.  Exercising. Five minutes before you check your blood pressure:  Use the bathroom and urinate so that you have an empty bladder.  Sit quietly in a dining room chair. Do not sit in a soft couch or an armchair. Do not talk. How to take your blood pressure To check your blood pressure, follow the instructions in the manual that came with your blood pressure monitor. If you have a digital blood pressure monitor, the instructions may be as follows: 1. Sit up straight in a chair. 2. Place your feet on the floor. Do not cross your ankles or legs. 3. Rest your left arm at the level of your heart on a table or desk or on the arm of a chair. 4. Pull up your shirt sleeve. 5. Wrap the blood pressure cuff around the upper part of your left arm, 1 inch (2.5 cm) above your elbow. It is best to wrap the cuff around bare skin. 6. Fit the cuff snugly around your arm. You should be able to place only one finger between the cuff and your arm. 7. Position the cord so that it rests in the bend of your elbow. 8. Press the power  button. 9. Sit quietly while the cuff inflates and deflates. 10. Read the digital reading on the monitor screen and write the numbers down (record them) in a notebook. 11. Wait 2-3 minutes, then repeat the steps, starting at step 1.   What does my blood pressure reading mean? A blood pressure reading consists of a higher number over a lower number. Ideally, your blood pressure should be below 120/80. The first ("top") number is called the systolic pressure. It is a measure of the pressure in your arteries as your heart beats. The second ("bottom") number is called the diastolic pressure. It is a measure of the pressure in your arteries as the heart relaxes. Blood pressure is classified into five stages. The following are the stages for adults who do not have a short-term serious illness or a chronic condition. Systolic pressure and diastolic pressure are measured in a unit called mm Hg (millimeters of mercury).  Normal  Systolic pressure: below 120.  Diastolic pressure: below 80. Elevated  Systolic pressure: 120-129.  Diastolic pressure: below 80. Hypertension stage 1  Systolic pressure: 130-139.  Diastolic pressure: 80-89. Hypertension stage 2  Systolic pressure: 140 or above.  Diastolic pressure: 90 or above. You can have elevated blood pressure or hypertension even if only the systolic or only the diastolic number in your reading is higher than normal. Follow these instructions at home:  Check your blood   pressure as often as recommended by your health care provider.  Check your blood pressure at the same time every day.  Take your monitor to the next appointment with your health care provider to make sure that: ? You are using it correctly. ? It provides accurate readings.  Be sure you understand what your goal blood pressure numbers are.  Tell your health care provider if you are having any side effects from blood pressure medicine.  Keep all follow-up visits as told by  your health care provider. This is important. General tips  Your health care provider can suggest a reliable monitor that will meet your needs. There are several types of home blood pressure monitors.  Choose a monitor that has an arm cuff. Do not choose a monitor that measures your blood pressure from your wrist or finger.  Choose a cuff that wraps snugly around your upper arm. You should be able to fit only one finger between your arm and the cuff.  You can buy a blood pressure monitor at most drugstores or online. Where to find more information American Heart Association: www.heart.org Contact a health care provider if:  Your blood pressure is consistently high. Get help right away if:  Your systolic blood pressure is higher than 180.  Your diastolic blood pressure is higher than 120. Summary  Blood pressure is a measurement of how strongly your blood is pressing against the walls of your arteries.  A blood pressure reading consists of a higher number over a lower number. Ideally, your blood pressure should be below 120/80.  Check your blood pressure at the same time every day.  Avoid caffeine, alcohol, smoking, and exercise for 30 minutes prior to checking your blood pressure. These agents can affect the accuracy of the blood pressure reading. This information is not intended to replace advice given to you by your health care provider. Make sure you discuss any questions you have with your health care provider. Document Revised: 04/18/2019 Document Reviewed: 04/18/2019 Elsevier Patient Education  2021 Else[employer] Inc.  Hypertension During Pregnancy Hypertension is also called high blood pressure. High blood pressure means that the force of the blood moving in your body is high enough to cause problems for you and your baby. Different types of high blood pressure can happen during pregnancy. The types are:  High blood pressure before you got pregnant. This is called chronic  hypertension.  This can continue during your pregnancy. Your doctor will want to keep checking your blood pressure. You may need medicine to control your blood pressure while you are pregnant. You will need follow-up visits after you have your baby.  High blood pressure that goes up during pregnancy when it was normal before. This is called gestational hypertension. It will often get better after you have your baby, but your doctor will need to watch your blood pressure to make sure that it is getting better.  You may develop high blood pressure after giving birth. This is called postpartum hypertension. This often occurs within 48 hours after childbirth but may occur up to 6 weeks after giving birth. Very high blood pressure during pregnancy is an emergency that needs treatment right away. How does this affect me? If you have high blood pressure during pregnancy, you have a higher chance of developing high blood pressure:  As you get older.  If you get pregnant again. In some cases, high blood pressure during pregnancy can cause:  Stroke.  Heart attack.  Damage to the  kidneys, lungs, or liver.  Preeclampsia.  HELLP syndrome.  Seizures.  Problems with the placenta. How does this affect my baby? Your baby may:  Be born early.  Not weigh as much as he or she should.  Not handle labor well, leading to a C-section. This condition may also result in a baby's death before birth (stillbirth). What are the risks?  Having high blood pressure during a past pregnancy.  Being overweight.  Being age 48 or older.  Being pregnant for the first time.  Being pregnant with more than one baby.  Becoming pregnant using fertility methods, such as IVF.  Having other problems, such as diabetes or kidney disease. What can I do to lower my risk?  Keep a healthy weight.  Eat a healthy diet.  Follow what your doctor tells you about treating any medical problems that you had before you  got pregnant. It is very important to go to all of your doctor visits. Your doctor will check your blood pressure and make sure that your pregnancy is progressing as it should. Treatment should start early if a problem is found.   How is this treated? Treatment for high blood pressure during pregnancy can vary. It depends on the type of high blood pressure you have and how serious it is.  If you were taking medicine for your blood pressure before you got pregnant, talk with your doctor. You may need to change the medicine during pregnancy if it is not safe for your baby.  If your blood pressure goes up during pregnancy, your doctor may order medicine to treat this.  If you are at risk for preeclampsia, your doctor may tell you to take a low-dose aspirin while you are pregnant.  If you have very high blood pressure, you may need to stay in the hospital so you and your baby can be watched closely. You may also need to take medicine to lower your blood pressure.  In some cases, if your condition gets worse, you may need to have your baby early. Follow these instructions at home: Eating and drinking  Drink enough fluid to keep your pee (urine) pale yellow.  Avoid caffeine.   Lifestyle  Do not smoke or use any products that contain nicotine or tobacco. If you need help quitting, ask your doctor.  Do not use alcohol or drugs.  Avoid stress.  Rest and get plenty of sleep.  Regular exercise can help. Ask your doctor what kinds of exercise are best for you. General instructions  Take over-the-counter and prescription medicines only as told by your doctor.  Keep all prenatal and follow-up visits. Contact a doctor if:  You have symptoms that your doctor told you to watch for, such as: ? Headaches. ? A feeling like you may vomit (nausea). ? Vomiting. ? Belly (abdominal) pain. ? Feeling dizzy or light-headed. Get help right away if:  You have symptoms of serious problems, such  as: ? Very bad belly pain that does not get better with treatment. ? A very bad headache that does not get better. ? Blurry vision. ? Double vision. ? Vomiting that does not get better. ? Sudden, fast weight gain. ? Sudden swelling in your hands, ankles, or face. ? Bleeding from your vagina. ? Blood in your pee. ? Shortness of breath. ? Chest pain. ? Weakness on one side of your body. ? Trouble talking.  Your baby is not moving as much as usual. These symptoms may be an emergency. Get  help right away. Call your local emergency services (911 in the [place]  Do not wait to see if the symptoms will go away.  Do not drive yourself to the hospital. Summary  High blood pressure is also called hypertension.  High blood pressure means that the force of the blood moving in your body is high enough to cause problems for you and your baby.  Get help right away if you have symptoms of serious problems due to high blood pressure.  Keep all prenatal and follow-up visits. This information is not intended to replace advice given to you by your health care provider. Make sure you discuss any questions you have with your health care provider. Document Revised: 01/15/2020 Document Reviewed: 01/15/2020 Elsevier Patient Education  2021 [employer].

## 2020-07-23 NOTE — MAU Note (Signed)
Presents for BP evaluation.  States had elevated BP's while taking @ home.  Reports current H/A, denies visual disturbances or epigastric pain.  Reports had blurred vision earlier during the week.  Denies VB or LOF.  Endorses +FM.

## 2020-07-24 LAB — OB RESULTS CONSOLE GBS: GBS: POSITIVE

## 2020-07-27 ENCOUNTER — Encounter (INDEPENDENT_AMBULATORY_CARE_PROVIDER_SITE_OTHER): Payer: Self-pay | Admitting: Family Medicine

## 2020-07-27 ENCOUNTER — Ambulatory Visit (INDEPENDENT_AMBULATORY_CARE_PROVIDER_SITE_OTHER): Payer: 59 | Admitting: Family Medicine

## 2020-07-27 ENCOUNTER — Other Ambulatory Visit: Payer: Self-pay

## 2020-07-27 VITALS — BP 110/71 | HR 74 | Temp 98.7°F | Ht 69.0 in | Wt 253.0 lb

## 2020-07-27 DIAGNOSIS — E669 Obesity, unspecified: Secondary | ICD-10-CM | POA: Diagnosis not present

## 2020-07-27 DIAGNOSIS — Z6832 Body mass index (BMI) 32.0-32.9, adult: Secondary | ICD-10-CM | POA: Diagnosis not present

## 2020-07-27 DIAGNOSIS — D508 Other iron deficiency anemias: Secondary | ICD-10-CM | POA: Diagnosis not present

## 2020-07-27 DIAGNOSIS — Z9189 Other specified personal risk factors, not elsewhere classified: Secondary | ICD-10-CM

## 2020-08-02 ENCOUNTER — Inpatient Hospital Stay (HOSPITAL_COMMUNITY): Payer: 59 | Admitting: Anesthesiology

## 2020-08-02 ENCOUNTER — Encounter (HOSPITAL_COMMUNITY): Payer: Self-pay | Admitting: Obstetrics and Gynecology

## 2020-08-02 ENCOUNTER — Other Ambulatory Visit: Payer: Self-pay

## 2020-08-02 ENCOUNTER — Inpatient Hospital Stay (HOSPITAL_COMMUNITY)
Admission: AD | Admit: 2020-08-02 | Discharge: 2020-08-06 | DRG: 805 | Disposition: A | Discharge status: Home / Self Care | Payer: 59 | Attending: Obstetrics & Gynecology | Admitting: Obstetrics & Gynecology

## 2020-08-02 DIAGNOSIS — O411230 Chorioamnionitis, third trimester, not applicable or unspecified: Secondary | ICD-10-CM | POA: Diagnosis present

## 2020-08-02 DIAGNOSIS — O4292 Full-term premature rupture of membranes, unspecified as to length of time between rupture and onset of labor: Secondary | ICD-10-CM | POA: Diagnosis present

## 2020-08-02 DIAGNOSIS — O99214 Obesity complicating childbirth: Secondary | ICD-10-CM | POA: Diagnosis present

## 2020-08-02 DIAGNOSIS — O99824 Streptococcus B carrier state complicating childbirth: Secondary | ICD-10-CM | POA: Diagnosis present

## 2020-08-02 DIAGNOSIS — O134 Gestational [pregnancy-induced] hypertension without significant proteinuria, complicating childbirth: Principal | ICD-10-CM | POA: Diagnosis present

## 2020-08-02 DIAGNOSIS — O26893 Other specified pregnancy related conditions, third trimester: Secondary | ICD-10-CM | POA: Diagnosis present

## 2020-08-02 DIAGNOSIS — Z20822 Contact with and (suspected) exposure to covid-19: Secondary | ICD-10-CM | POA: Diagnosis present

## 2020-08-02 DIAGNOSIS — Z3A38 38 weeks gestation of pregnancy: Secondary | ICD-10-CM

## 2020-08-02 DIAGNOSIS — O411290 Chorioamnionitis, unspecified trimester, not applicable or unspecified: Secondary | ICD-10-CM | POA: Diagnosis not present

## 2020-08-02 DIAGNOSIS — O139 Gestational [pregnancy-induced] hypertension without significant proteinuria, unspecified trimester: Secondary | ICD-10-CM | POA: Diagnosis not present

## 2020-08-02 LAB — COMPREHENSIVE METABOLIC PANEL
ALT: 16 U/L (ref 0–44)
AST: 18 U/L (ref 15–41)
Albumin: 2.8 g/dL — ABNORMAL LOW (ref 3.5–5.0)
Alkaline Phosphatase: 70 U/L (ref 38–126)
Anion gap: 8 (ref 5–15)
BUN: 7 mg/dL (ref 6–20)
CO2: 21 mmol/L — ABNORMAL LOW (ref 22–32)
Calcium: 9.3 mg/dL (ref 8.9–10.3)
Chloride: 104 mmol/L (ref 98–111)
Creatinine, Ser: 0.7 mg/dL (ref 0.44–1.00)
GFR, Estimated: >60 mL/min (ref >60)
Glucose, Bld: 97 mg/dL (ref 70–99)
Potassium: 3.9 mmol/L (ref 3.5–5.1)
Sodium: 133 mmol/L — ABNORMAL LOW (ref 135–145)
Total Bilirubin: 0.5 mg/dL (ref 0.3–1.2)
Total Protein: 6 g/dL — ABNORMAL LOW (ref 6.5–8.1)

## 2020-08-02 LAB — TYPE AND SCREEN
ABO/RH(D): B POS
Antibody Screen: NEGATIVE

## 2020-08-02 LAB — CBC
HCT: 35.4 % — ABNORMAL LOW (ref 36.0–46.0)
Hemoglobin: 11.9 g/dL — ABNORMAL LOW (ref 12.0–15.0)
MCH: 28.3 pg (ref 26.0–34.0)
MCHC: 33.6 g/dL (ref 30.0–36.0)
MCV: 84.1 fL (ref 80.0–100.0)
Platelets: 191 10*3/uL (ref 150–400)
RBC: 4.21 MIL/uL (ref 3.87–5.11)
RDW: 13.2 % (ref 11.5–15.5)
WBC: 8.1 10*3/uL (ref 4.0–10.5)
nRBC: 0 % (ref 0.0–0.2)

## 2020-08-02 LAB — POCT FERN TEST: POCT Fern Test: POSITIVE

## 2020-08-02 LAB — RESP PANEL BY RT-PCR (FLU A&B, COVID) ARPGX2
Influenza A by PCR: NEGATIVE
Influenza B by PCR: NEGATIVE
SARS Coronavirus 2 by RT PCR: NEGATIVE

## 2020-08-02 LAB — PROTEIN / CREATININE RATIO, URINE
Creatinine, Urine: 51.22 mg/dL
Protein Creatinine Ratio: 0.25 mg/mg{Cre} — ABNORMAL HIGH (ref 0.00–0.15)
Total Protein, Urine: 13 mg/dL

## 2020-08-02 LAB — RPR: RPR Ser Ql: NONREACTIVE

## 2020-08-02 MED ORDER — SODIUM CHLORIDE 0.9 % IV SOLN
5.0000 10*6.[IU] | Freq: Once | INTRAVENOUS | Status: AC
  Administered 2020-08-02: 5 10*6.[IU] via INTRAVENOUS
  Filled 2020-08-02: qty 5

## 2020-08-02 MED ORDER — OXYTOCIN BOLUS FROM INFUSION
333.0000 mL | Freq: Once | INTRAVENOUS | Status: AC
  Administered 2020-08-04: 333 mL via INTRAVENOUS

## 2020-08-02 MED ORDER — TERBUTALINE SULFATE 1 MG/ML IJ SOLN: 0.2500 mg | Freq: Once | INTRAMUSCULAR | Status: DC | PRN

## 2020-08-02 MED ORDER — SOD CITRATE-CITRIC ACID 500-334 MG/5ML PO SOLN: 30.0000 mL | ORAL | Status: DC | PRN

## 2020-08-02 MED ORDER — LIDOCAINE HCL (PF) 1 % IJ SOLN: 30.0000 mL | INTRAMUSCULAR | Status: DC | PRN

## 2020-08-02 MED ORDER — LACTATED RINGERS IV SOLN: 500.0000 mL | Freq: Once | INTRAVENOUS | Status: DC

## 2020-08-02 MED ORDER — PHENYLEPHRINE 40 MCG/ML (10ML) SYRINGE FOR IV PUSH (FOR BLOOD PRESSURE SUPPORT)
80.0000 ug | PREFILLED_SYRINGE | INTRAVENOUS | Status: DC | PRN
  Filled 2020-08-02: qty 10

## 2020-08-02 MED ORDER — FENTANYL-BUPIVACAINE-NACL 0.5-0.125-0.9 MG/250ML-% EP SOLN
12.0000 mL/h | EPIDURAL | Status: DC | PRN
  Administered 2020-08-02: 14 mL/h via EPIDURAL
  Administered 2020-08-03: 12 mL/h via EPIDURAL
  Filled 2020-08-02 (×3): qty 250

## 2020-08-02 MED ORDER — EPHEDRINE 5 MG/ML INJ: 10.0000 mg | INTRAVENOUS | Status: DC | PRN

## 2020-08-02 MED ORDER — LACTATED RINGERS IV SOLN: INTRAVENOUS | Status: DC

## 2020-08-02 MED ORDER — OXYTOCIN-SODIUM CHLORIDE 30-0.9 UT/500ML-% IV SOLN
2.5000 [IU]/h | INTRAVENOUS | Status: DC
  Administered 2020-08-04: 2.5 [IU]/h via INTRAVENOUS
  Filled 2020-08-02: qty 500

## 2020-08-02 MED ORDER — ACETAMINOPHEN 325 MG PO TABS: 650.0000 mg | ORAL_TABLET | ORAL | Status: DC | PRN

## 2020-08-02 MED ORDER — LIDOCAINE HCL (PF) 1 % IJ SOLN
INTRAMUSCULAR | Status: DC | PRN
  Administered 2020-08-02: 2 mL via EPIDURAL
  Administered 2020-08-02: 5 mL via EPIDURAL
  Administered 2020-08-02: 3 mL via EPIDURAL

## 2020-08-02 MED ORDER — OXYCODONE-ACETAMINOPHEN 5-325 MG PO TABS
1.0000 | ORAL_TABLET | ORAL | Status: DC | PRN
Start: 2020-08-02 — End: 2020-08-04

## 2020-08-02 MED ORDER — ONDANSETRON HCL 4 MG/2ML IJ SOLN: 4.0000 mg | Freq: Four times a day (QID) | INTRAMUSCULAR | Status: DC | PRN

## 2020-08-02 MED ORDER — OXYTOCIN-SODIUM CHLORIDE 30-0.9 UT/500ML-% IV SOLN
1.0000 m[IU]/min | INTRAVENOUS | Status: DC
  Administered 2020-08-02: 2 m[IU]/min via INTRAVENOUS
  Administered 2020-08-03: 6 m[IU]/min via INTRAVENOUS
  Filled 2020-08-02: qty 500

## 2020-08-02 MED ORDER — FLEET ENEMA 7-19 GM/118ML RE ENEM: 1.0000 | ENEMA | RECTAL | Status: DC | PRN

## 2020-08-02 MED ORDER — DIPHENHYDRAMINE HCL 50 MG/ML IJ SOLN: 12.5000 mg | INTRAMUSCULAR | Status: DC | PRN

## 2020-08-02 MED ORDER — LACTATED RINGERS IV SOLN
500.0000 mL | INTRAVENOUS | Status: DC | PRN
  Administered 2020-08-03: 500 mL via INTRAVENOUS

## 2020-08-02 MED ORDER — PHENYLEPHRINE 40 MCG/ML (10ML) SYRINGE FOR IV PUSH (FOR BLOOD PRESSURE SUPPORT): 80.0000 ug | PREFILLED_SYRINGE | INTRAVENOUS | Status: DC | PRN

## 2020-08-02 MED ORDER — PENICILLIN G POT IN DEXTROSE 60000 UNIT/ML IV SOLN
3.0000 10*6.[IU] | INTRAVENOUS | Status: DC
  Administered 2020-08-02 – 2020-08-03 (×11): 3 10*6.[IU] via INTRAVENOUS
  Filled 2020-08-02 (×11): qty 50

## 2020-08-02 MED ORDER — OXYCODONE-ACETAMINOPHEN 5-325 MG PO TABS: 2.0000 | ORAL_TABLET | ORAL | Status: DC | PRN

## 2020-08-02 NOTE — Plan of Care (Signed)
  Problem: Education: Goal: Knowledge of Childbirth will improve Outcome: Progressing Goal: Ability to make informed decisions regarding treatment and plan of care will improve Outcome: Progressing Goal: Ability to state and carry out methods to decrease the pain will improve Outcome: Progressing   Problem: Coping: Goal: Ability to verbalize concerns and feelings about labor and delivery will improve Outcome: Progressing   Problem: Life Cycle: Goal: Ability to make normal progression through stages of labor will improve Outcome: Progressing   

## 2020-08-02 NOTE — Progress Notes (Signed)
RN discussing with patient the frequency of uc's and the risks & benefits of Pitocin. Patient & father of baby requesting to wait to speak with doula about Pitocin.  RN encouraged to call when doula arrives and they have discussed with her at their request.

## 2020-08-02 NOTE — Progress Notes (Signed)
Pt called out for RN, states that she is ready to start Pitocin at this time.  RN will notify midwife of pt's request.

## 2020-08-02 NOTE — Progress Notes (Signed)
PT requesting Epidural at this time, still requesting to wait on starting Pitocin at this time.

## 2020-08-02 NOTE — Anesthesia Preprocedure Evaluation (Signed)
Anesthesia Evaluation  Patient identified by MRN, date of birth, ID band Patient awake    Reviewed: Allergy & Precautions, NPO status , Patient's Chart, lab work & pertinent test results  Airway Mallampati: III  TM Distance: >3 FB Neck ROM: Full    Dental  (+) Teeth Intact, Dental Advisory Given   Pulmonary neg pulmonary ROS,    Pulmonary exam normal breath sounds clear to auscultation       Cardiovascular negative cardio ROS Normal cardiovascular exam Rhythm:Regular Rate:Normal     Neuro/Psych PSYCHIATRIC DISORDERS Anxiety negative neurological ROS     GI/Hepatic negative GI ROS, Neg liver ROS,   Endo/Other  Obesity   Renal/GU negative Renal ROS     Musculoskeletal  (+) Arthritis ,   Abdominal   Peds  Hematology  (+) Blood dyscrasia, anemia , Plt 191k   Anesthesia Other Findings Day of surgery medications reviewed with the patient.  Reproductive/Obstetrics                             Anesthesia Physical Anesthesia Plan  ASA: II  Anesthesia Plan: Epidural   Post-op Pain Management:    Induction:   PONV Risk Score and Plan: 2 and Treatment may vary due to age or medical condition  Airway Management Planned: Natural Airway  Additional Equipment:   Intra-op Plan:   Post-operative Plan:   Informed Consent: I have reviewed the patients History and Physical, chart, labs and discussed the procedure including the risks, benefits and alternatives for the proposed anesthesia with the patient or authorized representative who has indicated his/her understanding and acceptance.     Dental advisory given  Plan Discussed with:   Anesthesia Plan Comments: (Patient identified. Risks/Benefits/Options discussed with patient including but not limited to bleeding, infection, nerve damage, paralysis, failed block, incomplete pain control, headache, blood pressure changes, nausea, vomiting,  reactions to medication both or allergic, itching and postpartum back pain. Confirmed with bedside nurse the patient's most recent platelet count. Confirmed with patient that they are not currently taking any anticoagulation, have any bleeding history or any family history of bleeding disorders. Patient expressed understanding and wished to proceed. All questions were answered. )        Anesthesia Quick Evaluation

## 2020-08-02 NOTE — H&P (Signed)
OB ADMISSION/ HISTORY & PHYSICAL:  Admission Date: 08/02/2020 12:15 AM  Admit Diagnosis: SROM   Jamie Bean is a 35 y.o. female G1P0000 [redacted]w[redacted]d presenting for SROM on 08/01/20 @ 2315 clear fluid. Endorses active FM, denies vaginal bleeding.   History of current pregnancy: G1P0000   Primary OB Provider: [facility]  Patient entered care with [facility] at 9.1 wks.   EDC 08/15/20 by [facility] 07/04/21and congruent w/ 9 wk U/S.   Anatomy scan:  25 wks, complete w/ fundal placenta.   Antenatal testing: for AMA, IUI started at 32 weeks Last evaluation: 37  wks  Significant prenatal events:  Patient Active Problem List   Diagnosis Date Noted  . Indication for care in labor or delivery 08/02/2020  . At risk for dehydration 07/07/2020  . Elevated alanine aminotransferase (ALT) level 07/06/2020  . Absolute anemia 05/17/2020  . Constipation 01/09/2020  . Pregnancy 01/01/2020  . Class 2 severe obesity with serious comorbidity and body mass index (BMI) of 35.0 to 35.9 in adult (HCC) 12/15/2019  . Shortness of breath on exertion 10/09/2017  . Mixed hyperlipidemia 10/09/2017  . Vitamin D deficiency 10/09/2017  . Hyperglycemia 10/09/2017  . Bradycardia 08/31/2017  . Chronic pain of both knees 08/31/2017  . Need for diphtheria-tetanus-pertussis (Tdap) vaccine 08/31/2017  . 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  Anemia   . Anxiety   . Arthritis   . Constipation   . Female infertility   . Joint pain   . Kidney problem   . Knee pain   . Palpitations   . UTI (urinary tract infection)   . Vitamin D deficiency     Past surgical history:  Past Surgical History:  Procedure Laterality Date  . INCISE AND DRAIN ABCESS     1994   Family History:  Family History  Problem Relation Age of Onset  . Renal cancer Maternal Grandmother 70  . Alcohol abuse Maternal Grandmother   . Hyperlipidemia Maternal Grandmother   . Hypertension Maternal Grandmother   . Arthritis Father   . Obesity Father   . Alcohol abuse Maternal Uncle   . Mental illness Maternal Uncle   . Mental illness Paternal Aunt   . Diabetes Paternal Aunt   . Hyperlipidemia Maternal Grandfather   . Hypertension Maternal Grandfather   . Stroke Paternal Grandmother   . Diabetes Paternal Grandmother   . Cancer Paternal Grandfather        lung  . Colon cancer Neg Hx   . Breast cancer Neg Hx     Social History:  reports that she has never smoked. She has never used smokeless tobacco. She reports previous alcohol use. She reports that she does not use drugs.  Allergies: Sulfa antibiotics   Current Medications at time of  admission:  Prior to Admission medications   Medication Sig Start Date End Date Taking? Authorizing Provider  aspirin 81 MG EC tablet Take 81 mg by mouth daily. Swallow whole.    [provider]  ferrous sulfate 325 (65 FE) MG tablet Take 325 mg by mouth daily with breakfast.    [provider]  Magnesium 400 MG CAPS Take by mouth.    [provider]  OVER THE COUNTER MEDICATION     [provider]  Prenatal Vit-Fe Fumarate-FA (PRENATAL VITAMIN AND MINERAL PO) Take 1 tablet by mouth daily.    [provider]  Probiotic Product (PROBIOTIC PO) Take 1 capsule by mouth daily. Probiotic prenatal due to recent pregnancy    [provider]    Review of  Systems: Constitutional: Negative   HENT: Negative   Eyes: Negative   Respiratory: Negative   Cardiovascular: Negative   Gastrointestinal: Negative  Genitourinary: Negative for bloody show, Positive for LOF   Musculoskeletal: Negative   Skin: Negative   Neurological: Negative   Endo/Heme/Allergies: Negative   Psychiatric/Behavioral: Negative    Physical Exam: VS: Blood pressure 133/83, pulse 72, temperature 98.8 F (37.1 C), temperature source Oral, resp. rate 17, height 5\' 9"  (1.753 m), weight 118.4 kg, last menstrual period 10/12/2019, SpO2 96 %. AAO x3, no signs of distress Cardiovascular: RRR Respiratory: Lung fields clear to ausculation GU/GI: Abdomen gravid, non-tender, non-distended, active FM, vertex Extremities: generalized edema, negative for pain, tenderness, and cords  Cervical exam:Dilation: 1 Effacement (%): 60 Station: -3 Exam by:: K Torphy, RN FHR: baseline rate 135 / variability moderate / accelerations present / absent decelerations TOCO: 5 min   Prenatal Transfer Tool  Maternal Diabetes: No Genetic Screening: Normal Maternal Ultrasounds/Referrals: Normal Fetal Ultrasounds or other Referrals:  None Maternal Substance Abuse:  No Significant Maternal Medications:  None Significant Maternal Lab Results: Group B Strep positive    [name] [redacted]w[redacted]d admitted for SROM on 08/01/20 @ 2315 clear fluid.  latent stage of labor FHR category 1 GBS positive PCN @ 0151, 0537 Pain management plan: Epidural PRN   Plan:  Admit to L&D Routine admission orders Epidural PRN  Will notify Dr 2316 of admission and plan of care  Richardson Dopp MSN, CNM 08/02/2020 6:35 AM

## 2020-08-02 NOTE — Progress Notes (Signed)
Subjective:    Comfortable w/ epidural. Spouse and doula present and supportive. Pt agrees to SVE. Position changed by CNM to right lateral lunge position with the peanut ball.   Objective:    VS: BP 125/72   Pulse 69   Temp 98.1 F (36.7 C) (Oral)   Resp 18   Ht 5\' 9"  (1.753 m)   Wt 118.4 kg   LMP 10/12/2019   SpO2 96%   BMI 38.54 kg/m  FHR : baseline 140 / variability moderate / accelerations present / absent decelerations Toco: contractions every 3-5 minutes  Membranes: SROM x21 hrs Dilation: 5 Effacement (%): 90 Cervical Position: Middle Station: -1 Presentation: Vertex Exam by:: [email or web address] CNM Pitocin 6 mU/min  Assessment/Plan:   35 y.o. G1P0000 [redacted]w[redacted]d  Labor: Latent labor, progressing, will continue to increase Pitocin Fetal Wellbeing:  Category I Pain Control:  Epidural I/D:  GBS neg, adequate treatment w/ PCN Anticipated MOD:  NSVD  [redacted]w[redacted]d MSN, CNM 08/02/2020 8:39 PM

## 2020-08-02 NOTE — Progress Notes (Signed)
Subjective:    Addressed concerns RE: Pitocin augmentation. Pt fearful of pain associated w/ increase in intensity and frequency of contractions. Reviewed pain management options. Pt considering epidural. Continues to refuse Pitocin. Risks of infection R/T prolonged ROM w/o labor discussed and questions answered. Spouse asks about "pill to induce labor" CNM educated spouse on use of Cytotec for cervical ripening and that pt does not meet recommended criteria for Cytotec. Spouse asks to speak privately.   Objective:    VS: BP 128/73   Pulse 75   Temp 98.8 F (37.1 C) (Oral)   Resp 18   Ht 5\' 9"  (1.753 m)   Wt 118.4 kg   LMP 10/12/2019   SpO2 96%   BMI 38.54 kg/m  FHR : baseline 140 / variability moderate / accelerations present / absent decelerations Toco: contractions every 4-6 minutes  Membranes: SROM x15 hrs Dilation: 3 Effacement (%): 80 Cervical Position: Middle Station: -2 Presentation: Vertex Exam by:: [email or web address], CNM   Assessment/Plan:   35 y.o. G1P0000 [redacted]w[redacted]d SROM  Labor: protracted latent phase, refuses Pitocin Fetal Wellbeing:  Category I Pain Control:  considering epidural I/D:  GBS positive Anticipated MOD:  NSVD   Dr. [redacted]w[redacted]d notified of pt's refusal of Pitocin  Su Hilt MSN, CNM 08/02/2020 2:21 PM

## 2020-08-02 NOTE — Progress Notes (Incomplete)
Pt called RN and states that they would like to wait for Pitocin at this time and her and father of the baby would like to try some spinning baby techniques prior to Pitocin.  Will notify RN when they are ready. RN will notify midwife.

## 2020-08-02 NOTE — MAU Note (Signed)
Pt reports she started leaking clear, odorless fluid around 2315 last night. Reports cont leaking. Reports +FM, denies VB. Deneis pain. GBS+

## 2020-08-02 NOTE — Progress Notes (Signed)
Subjective:    Some discomfort w/ contractions. Reports feeling tired and needing rest. Discussed augmentation for SROM w/o labor. Pt declines Pitocin stating that she needs to sleep and "think about it".    Objective:    VS: BP 128/73   Pulse 75   Temp 98.8 F (37.1 C) (Oral)   Resp 18   Ht 5\' 9"  (1.753 m)   Wt 118.4 kg   LMP 10/12/2019   SpO2 96%   BMI 38.54 kg/m  FHR : baseline 145 / variability moderate / accelerations present / absent decelerations Toco: contractions are irreg Membranes: SROM x10 hrs Dilation: 3 Effacement (%): 80 Cervical Position: Middle Station: -2 Presentation: Vertex Exam by:: [email or web address], CNM   Assessment/Plan:   35 y.o. G1P0000 [redacted]w[redacted]d SROM  Labor: declines Pitocin augmentation Fetal Wellbeing:  Category I Pain Control:  discussed options I/D:  GBS pos, treated w/ PCN Anticipated MOD:  NSVD  [redacted]w[redacted]d MSN, CNM 08/02/2020 2:11 PM

## 2020-08-02 NOTE — Progress Notes (Signed)
RN discussing with pt current contraction pattern and that pt is only contracting occasionally.  RN answering questions in regard to Pitocin and pt states her and her husband will continue to discuss this as an option.

## 2020-08-02 NOTE — Anesthesia Procedure Notes (Signed)
Epidural Patient location during procedure: OB Start time: 08/02/2020 3:06 PM End time: 08/02/2020 3:14 PM  Staffing Anesthesiologist: Cecile Hearing, MD Performed: anesthesiologist   Preanesthetic Checklist Completed: patient identified, IV checked, risks and benefits discussed, monitors and equipment checked, pre-op evaluation and timeout performed  Epidural Patient position: sitting Prep: DuraPrep Patient monitoring: blood pressure and continuous pulse ox Approach: midline Location: L3-L4 Injection technique: LOR air  Needle:  Needle type: Tuohy  Needle gauge: 17 G Needle length: 9 cm Needle insertion depth: 8 cm Catheter size: 19 Gauge Catheter at skin depth: 14 cm Test dose: negative and Other (1% Lidocaine)  Additional Notes Patient identified.  Risk benefits discussed including failed block, incomplete pain control, headache, nerve damage, paralysis, blood pressure changes, nausea, vomiting, reactions to medication both toxic or allergic, and postpartum back pain.  Patient expressed understanding and wished to proceed.  All questions were answered.  Sterile technique used throughout procedure and epidural site dressed with sterile barrier dressing. No paresthesia or other complications noted. The patient did not experience any signs of intravascular injection such as tinnitus or metallic taste in mouth nor signs of intrathecal spread such as rapid motor block. Please see nursing notes for vital signs. Reason for block:procedure for pain

## 2020-08-03 MED ORDER — SODIUM CHLORIDE 0.9 % IV SOLN
3.0000 g | Freq: Four times a day (QID) | INTRAVENOUS | Status: DC
  Administered 2020-08-03: 3 g via INTRAVENOUS
  Filled 2020-08-03 (×3): qty 8

## 2020-08-03 MED ORDER — CALCIUM CARBONATE ANTACID 500 MG PO CHEW
2.0000 | CHEWABLE_TABLET | Freq: Once | ORAL | Status: AC
  Administered 2020-08-03: 400 mg via ORAL
  Filled 2020-08-03: qty 2

## 2020-08-03 MED ORDER — DIPHENHYDRAMINE HCL 50 MG/ML IJ SOLN
25.0000 mg | Freq: Once | INTRAMUSCULAR | Status: AC
  Administered 2020-08-03: 25 mg via INTRAVENOUS
  Filled 2020-08-03: qty 1

## 2020-08-03 MED ORDER — LACTATED RINGERS AMNIOINFUSION: INTRAVENOUS | Status: DC

## 2020-08-03 MED ORDER — ACETAMINOPHEN 500 MG PO TABS
1000.0000 mg | ORAL_TABLET | Freq: Once | ORAL | Status: AC
  Administered 2020-08-03: 1000 mg via ORAL
  Filled 2020-08-03: qty 2

## 2020-08-03 MED ORDER — LACTATED RINGERS AMNIOINFUSION
INTRAVENOUS | Status: DC
  Administered 2020-08-03: 100 mL via INTRAUTERINE

## 2020-08-03 MED ORDER — LACTATED RINGERS IV BOLUS
1000.0000 mL | Freq: Once | INTRAVENOUS | Status: AC
  Administered 2020-08-03: 1000 mL via INTRAVENOUS

## 2020-08-03 MED ORDER — TRANEXAMIC ACID-NACL 1000-0.7 MG/100ML-% IV SOLN
1000.0000 mg | Freq: Once | INTRAVENOUS | Status: DC | PRN
  Filled 2020-08-03: qty 100

## 2020-08-03 NOTE — Progress Notes (Signed)
Subjective:    Remains comfortable with epidural. SVE 7/100/+1. MVUs have been adequate for approximately 1 hour. 180-210. Variables with stronger ctx. If become recurrent will resume amnioinfusion. CAT1 at present.  Objective:    VS: BP 139/82   Pulse 72   Temp 98.1 F (36.7 C) (Oral)   Resp 16   Ht 5\' 9"  (1.753 m)   Wt 118.4 kg   LMP 10/12/2019   SpO2 99%   BMI 38.54 kg/m  FHR : baseline 150 / variability moderate / accelerations present / early, occasional variable decelerations Toco: contractions every 2-5 minutes / MVU 180 Membranes: SROM on 08/01/20 @ 2315 Dilation: 7 Effacement (%): 90 Cervical Position: Middle Station: Plus 1 Presentation: Vertex Exam by:: Kim Jeven Topper CNM Pitocin 28 mU/min  Assessment/Plan:   35 y.o. G1P0000 [redacted]w[redacted]d active phase labor. IUPC in place. MVUs adequate x 1 hour. CAT1. Early and occasional variable decels. Will recheck in 3 hours.   Labor: prodromal active phase Preeclampsia:  no signs or symptoms of toxicity Fetal Wellbeing:  Category I Pain Control:  Epidural I/D:  GBS negative Anticipated MOD:  possible cesarean section if no progress at next check.   Dr [redacted]w[redacted]d aware of pt status and POC.  Connye Burkitt MSN, CNM 08/03/2020 8:33 PM

## 2020-08-03 NOTE — Progress Notes (Addendum)
Subjective:    No cervical change since 2030 (7.5 hrs) and cervix is starting to thicken. Last U/S was 1 week ago and EFW was 6# 11 oz.   Objective:    VS: BP 99/83   Pulse (!) 106   Temp 98.6 F (37 C) (Oral)   Resp 18   Ht 5\' 9"  (1.753 m)   Wt 118.4 kg   LMP 10/12/2019   SpO2 99%   BMI 38.54 kg/m  FHR : baseline 145 / variability moderate / accelerations present / variable decelerations IUPC: contractions every 2-4 minutes / MVU 120 Membranes: SROM x 25 hrs Dilation: 5 Effacement (%):  (Swollen per CNM) Cervical Position: Middle Station: 0 Presentation: Vertex Exam by:: [email or web address], CNM Pitocin 8 mU/min  Assessment/Plan:   35 y.o. G1P0000 106w2d  Labor: protracted latent phase, Pitocin x 10 hrs, no cervical change x7.5 hrs, inadequate MVU's Fetal Wellbeing:  Category I     -Cat II tracing resolved after amnioinfusion Pain Control:  Epidural I/D:  GBS pos, adequate treatment w/ PCN Anticipated MOD:  discussed possibility of primary C/S   Will discuss assessment w/ Dr. [redacted]w[redacted]d MSN, CNM 08/03/2020 4:18 AM

## 2020-08-03 NOTE — Progress Notes (Signed)
Subjective:    Jamie Bean is coping well with labor. Remains comfortable. Spinning babies positions done with doula. SVE 6/100/+1. Cervix is not edematous. No caput noted. IUPC placed after POC reviewed with pt and significant other. Pt wishing to continue with pitocin. Will increase pitocin to a max of 69mu if needed. Tums 2 tabs given.    Objective:    VS: BP (!) 143/68   Pulse 88   Temp 98.1 F (36.7 C) (Axillary)   Resp 16   Ht 5\' 9"  (1.753 m)   Wt 118.4 kg   LMP 10/12/2019   SpO2 99%   BMI 38.54 kg/m  FHR : baseline 135 / variability moderate / accelerations present / early decelerations Toco: contractions every 3 minutes / MVU 65 Membranes: SROM Dilation: 6 Effacement (%): 90 Cervical Position: Middle Station: Plus 1 Presentation: Vertex Exam by:: Delecia Vastine CNM Pitocin 18 mU/min  Assessment/Plan:   35 y.o. G1P0000 [redacted]w[redacted]d prodromal active phase. SVE 6/100/+1, Cervix is non edematous  CAT1, IUPC replaced without difficulty. MVUs not adequate. Will increase pitocin 2x2 to max of 67mu prn. No caput noted. Tums 2 tabs given.   Labor: prodromal active phase Preeclampsia:  no signs or symptoms of toxicity Fetal Wellbeing:  Category I Pain Control:  Epidural I/D:  GBS positive tx adequatly  Anticipated MOD:  NSVD  Dr 31m updated on pt status and POC.   Jamie Ober MSN, CNM 08/03/2020 4:25 PM

## 2020-08-03 NOTE — Progress Notes (Signed)
Pt declined having Pitocin increased. CNM to bedside to discuss reservations in plan of care. Doula present and asking questions RE: waiting for spontaneous labor. Risks of prolonged ROM discussed including chorio affecting mother and fetus, PPH affecting mother, and possible primary C-Section and associated risks. Pt states that she wants a healthy outcome for herself and her baby and agrees to increasing Pitocin to promote labor.     Rhea Pink, MSN, CNM 08/03/2020 7:17 AM

## 2020-08-03 NOTE — Progress Notes (Signed)
Called to bedside by RN for concerns of late and variable decels x2 hrs. RN has paused Pitocin since 2357. Reviewed fetal surveillance with RN and no late decelerations identified by CNM.   Subjective:    Remains comfortable w/ epidural. Discussed placement of IUPC and FSE and pt agrees.   Objective:    VS: BP (!) 131/97   Pulse 92   Temp 98.7 F (37.1 C) (Oral)   Resp 18   Ht 5\' 9"  (1.753 m)   Wt 118.4 kg   LMP 10/12/2019   SpO2 96%   BMI 38.54 kg/m  FHR : baseline 145 / variability moderate / accelerations present / absent decelerations IUPC: contractions every 3-4 minutes  Membranes: SROM x25 hrs Dilation: 5 Effacement (%): 90 Cervical Position: Middle Station: -1 Presentation: Vertex Exam by:: [email or web address], CNM Pitocin off, last rate was 10 mU/min  Assessment/Plan:   35 y.o. G1P0000 [redacted]w[redacted]d SROM  Labor: protracted letent phase Fetal Wellbeing:  Category I Pain Control:  Epidural I/D:  GBS positive w/ adequate PCN treatment Anticipated MOD:  NSVD  [redacted]w[redacted]d MSN, CNM 08/03/2020 12:44 AM

## 2020-08-03 NOTE — Progress Notes (Signed)
Chief Complaint:   OBESITY Jamie Bean is here to discuss her progress with her obesity treatment plan along with follow-up of her obesity related diagnoses.   Today's visit was #: 31 Starting weight: 218 lbs Starting date: 10/09/2017 Today's weight: 253 lbs Today's date: 07/27/2020 Total lbs lost to date: +35 Body mass index is 37.36 kg/m.   Interim History:  Jamie Bean has an appointment tomorrow with GYN regarding high blood pressure from last Friday.  Blood pressure has been normal since then at home (120s/80s).  No longer with headaches and feeling well without concerns today.  She is [redacted] weeks pregnant.  She does pregnancy meditations for 10-15 minutes 3 times per week and walks twice per week to help with stress.  Current Meal Plan: following a lower carbohydrate, vegetable and lean protein rich diet plan for 65% of the time.  Current Exercise Plan: Walking for 35-40 minutes 1-2 times per week.  Assessment/Plan:    1. Other iron deficiency anemia Jamie Bean is taking ferrous sulfate 325 mg daily and a prenatal vitamin with iron.  Denies constipation.  She is drinking 125 ounces of water per day.  Denies dizziness/lightheadedness.  Plan:  Continue supplements.  Nutrition: Iron-rich foods include dark leafy greens, red and white meats, eggs, seafood, and beans.  Certain foods and drinks prevent your body from absorbing iron properly. Avoid eating these foods in the same meal as iron-rich foods or with iron supplements. These foods include: coffee, black tea, and red wine; milk, dairy products, and foods that are high in calcium; beans and soybeans; whole grains. Constipation can be a side effect of iron supplementation. Increased water and fiber intake are helpful. Water goal: > 2 liters/day. Fiber goal: > 25 grams/day.  CBC Latest Ref Rng & Units 08/06/2020 08/04/2020 08/02/2020  WBC 4.0 - 10.5 K/uL 6.8 16.5(H) 8.1  Hemoglobin 12.0 - 15.0 g/dL 10.0(L) 10.5(L) 11.9(L)  Hematocrit 36.0 -  46.0 % 29.8(L) 30.5(L) 35.4(L)  Platelets 150 - 400 K/uL 157 165 191   Lab Results  Component Value Date   VITAMINB12 881 10/09/2017   2. At risk for dehydration Jamie Bean is at higher than average risk of dehydration.  Jamie Bean was given more than 8 minutes of proper hydration counseling today.  We discussed the signs and symptoms of dehydration, some of which may include muscle cramping, constipation or even orthostatic symptoms.  Counseling on the prevention of dehydration was also provided today.  Jamie Bean is at risk for dehydration due to weight loss, lifestyle and behavorial habits and possibly due to taking certain medication(s).  She was encouraged to adequately hydrate and monitor fluid status to avoid dehydration as well as weight loss plateaus.  Unless pre-existing renal or cardiopulmonary conditions exist, in which patient was told to limit their fluid intake, I recommended roughly one half of their weight in pounds to be the approximate ounces of non-caloric, non-caffeinated beverages they should drink per day; including more if they are engaging in exercise.  3. Obesity, current BMI 37.4  Course: Jamie Bean is currently in the action stage of change. As such, her goal is to continue with weight loss efforts.   Nutrition goals: She has agreed to following a lower carbohydrate, vegetable and lean protein rich diet plan.   Exercise goals: As is.  Behavioral modification strategies: increasing lean protein intake and planning for success.  Jamie Bean has agreed to follow-up with our clinic in 5-6 weeks (since having a baby soon). She was informed of the importance  of frequent follow-up visits to maximize her success with intensive lifestyle modifications for her multiple health conditions.   Objective:   Blood pressure 110/71, pulse 74, temperature 98.7 F (37.1 C), height 5\' 9"  (1.753 m), weight 253 lb (114.8 kg), last menstrual period 10/12/2019, SpO2 98 %, unknown if currently  breastfeeding. Body mass index is 37.36 kg/m.  General: Cooperative, alert, well developed, in no acute distress. HEENT: Conjunctivae and lids unremarkable. Cardiovascular: Regular rhythm.  Lungs: Normal work of breathing. Neurologic: No focal deficits.   Lab Results  Component Value Date   CREATININE 0.96 08/06/2020   BUN 11 08/06/2020   NA 136 08/06/2020   K 4.0 08/06/2020   CL 108 08/06/2020   CO2 21 (L) 08/06/2020   Lab Results  Component Value Date   ALT 18 08/06/2020   AST 22 08/06/2020   ALKPHOS 62 08/06/2020   BILITOT 0.6 08/06/2020   Lab Results  Component Value Date   HGBA1C 5.0 09/05/2019   HGBA1C 5.2 07/01/2018   HGBA1C 5.4 02/04/2018   HGBA1C 5.5 10/09/2017   HGBA1C 5.4 08/31/2017   Lab Results  Component Value Date   INSULIN 7.3 07/01/2018   INSULIN 6.9 02/04/2018   INSULIN 5.8 10/09/2017   Lab Results  Component Value Date   TSH 1.10 09/05/2019   Lab Results  Component Value Date   CHOL 245 (H) 04/06/2020   HDL 82 04/06/2020   LDLCALC 143 (H) 04/06/2020   TRIG 118 04/06/2020   CHOLHDL 3.0 04/06/2020   Lab Results  Component Value Date   WBC 6.8 08/06/2020   HGB 10.0 (L) 08/06/2020   HCT 29.8 (L) 08/06/2020   MCV 83.5 08/06/2020   PLT 157 08/06/2020   Attestation Statements:   Reviewed by clinician on day of visit: allergies, medications, problem list, medical history, surgical history, family history, social history, and previous encounter notes.  I, 10/06/2020, CMA, am acting as Insurance claims handler for Energy manager, DO.  I have reviewed the above documentation for accuracy and completeness, and I agree with the above. [employer], D.O.  The 21st Century Cures Act was signed into law in 2016 which includes the topic of electronic health records.  This provides immediate access to information in MyChart.  This includes consultation notes, operative notes, office notes, lab results and pathology reports.  If you have any  questions about what you read please let 2017 know at your next visit so we can discuss your concerns and take corrective action if need be.  We are right here with you.

## 2020-08-03 NOTE — Progress Notes (Addendum)
Subjective:    Jamie Bean is doing well with ctxs. Comfortable with epidural. Pitocin on 60mu. CAT 1. IUPC pulled out while turning pt. SVE 6/90/0. Swollen anterior and posterior lip. Pt wishes to continue with POC.   Objective:    VS: BP 130/87   Pulse 91   Temp 98.7 F (37.1 C) (Oral)   Resp 16   Ht 5\' 9"  (1.753 m)   Wt 118.4 kg   LMP 10/12/2019   SpO2 99%   BMI 38.54 kg/m  FHR : baseline 130 / variability moderate / accelerations present / absent decelerations Toco: contractions every 2-4 minutes /  Membranes: SROM Dilation: 6 Effacement (%): 90 (swollen anterior and posterior lip) Cervical Position: Middle Station: 0 Presentation: Vertex Exam by:: Holshouser CNM Pitocin 16 mU/min  Assessment/Plan:   35 y.o. G1P0000 [redacted]w[redacted]d protracted latent phase, now active @ 6cm. Pitocin @ 80mu. CAT 1.   Labor: Progressing on pitocin. Continue to titrate 2x2 to max of 20. Preeclampsia:  no signs or symptoms of toxicity Fetal Wellbeing:  Category I Pain Control:  Epidural I/D:  GBS positive with adequate treatment Anticipated MOD:  NSVD  12m MSN, CNM 08/03/2020 12:23 PM  MD Addendum: I reviewed chart and agree with above findings, assessment and plan as outlined above by 08/05/2020 MSN, CNM Dr. Carollee Leitz. 08/03/2020.

## 2020-08-03 NOTE — Progress Notes (Signed)
Pt refuses to increase Pitocin rate at this time. Pt educated regarding importance of increasing the medication to reach adequate MVU's.

## 2020-08-04 ENCOUNTER — Encounter (HOSPITAL_COMMUNITY): Payer: Self-pay | Admitting: Obstetrics and Gynecology

## 2020-08-04 DIAGNOSIS — O411290 Chorioamnionitis, unspecified trimester, not applicable or unspecified: Secondary | ICD-10-CM | POA: Diagnosis not present

## 2020-08-04 LAB — CBC
HCT: 30.5 % — ABNORMAL LOW (ref 36.0–46.0)
Hemoglobin: 10.5 g/dL — ABNORMAL LOW (ref 12.0–15.0)
MCH: 28.8 pg (ref 26.0–34.0)
MCHC: 34.4 g/dL (ref 30.0–36.0)
MCV: 83.8 fL (ref 80.0–100.0)
Platelets: 165 10*3/uL (ref 150–400)
RBC: 3.64 MIL/uL — ABNORMAL LOW (ref 3.87–5.11)
RDW: 13.3 % (ref 11.5–15.5)
WBC: 16.5 10*3/uL — ABNORMAL HIGH (ref 4.0–10.5)
nRBC: 0 % (ref 0.0–0.2)

## 2020-08-04 MED ORDER — OXYCODONE HCL 5 MG PO TABS: 5.0000 mg | ORAL_TABLET | ORAL | Status: DC | PRN

## 2020-08-04 MED ORDER — DIPHENHYDRAMINE HCL 25 MG PO CAPS: 25.0000 mg | ORAL_CAPSULE | Freq: Four times a day (QID) | ORAL | Status: DC | PRN

## 2020-08-04 MED ORDER — IBUPROFEN 600 MG PO TABS
600.0000 mg | ORAL_TABLET | Freq: Four times a day (QID) | ORAL | Status: DC
  Administered 2020-08-04 – 2020-08-06 (×11): 600 mg via ORAL
  Filled 2020-08-04 (×11): qty 1

## 2020-08-04 MED ORDER — TETANUS-DIPHTH-ACELL PERTUSSIS 5-2.5-18.5 LF-MCG/0.5 IM SUSY: 0.5000 mL | PREFILLED_SYRINGE | Freq: Once | INTRAMUSCULAR | Status: DC

## 2020-08-04 MED ORDER — SENNOSIDES-DOCUSATE SODIUM 8.6-50 MG PO TABS
2.0000 | ORAL_TABLET | Freq: Every day | ORAL | Status: DC
  Administered 2020-08-05 – 2020-08-06 (×2): 2 via ORAL
  Filled 2020-08-04 (×2): qty 2

## 2020-08-04 MED ORDER — OXYCODONE HCL 5 MG PO TABS: 10.0000 mg | ORAL_TABLET | ORAL | Status: DC | PRN

## 2020-08-04 MED ORDER — ONDANSETRON HCL 4 MG PO TABS: 4.0000 mg | ORAL_TABLET | ORAL | Status: DC | PRN

## 2020-08-04 MED ORDER — SIMETHICONE 80 MG PO CHEW: 80.0000 mg | CHEWABLE_TABLET | ORAL | Status: DC | PRN

## 2020-08-04 MED ORDER — ONDANSETRON HCL 4 MG/2ML IJ SOLN: 4.0000 mg | INTRAMUSCULAR | Status: DC | PRN

## 2020-08-04 MED ORDER — DIBUCAINE (PERIANAL) 1 % EX OINT: 1.0000 "application " | TOPICAL_OINTMENT | CUTANEOUS | Status: DC | PRN

## 2020-08-04 MED ORDER — ACETAMINOPHEN 325 MG PO TABS: 650.0000 mg | ORAL_TABLET | ORAL | Status: DC | PRN

## 2020-08-04 MED ORDER — BENZOCAINE-MENTHOL 20-0.5 % EX AERO
1.0000 "application " | INHALATION_SPRAY | CUTANEOUS | Status: DC | PRN
  Filled 2020-08-04: qty 56

## 2020-08-04 MED ORDER — ZOLPIDEM TARTRATE 5 MG PO TABS: 5.0000 mg | ORAL_TABLET | Freq: Every evening | ORAL | Status: DC | PRN

## 2020-08-04 MED ORDER — COCONUT OIL OIL
1.0000 "application " | TOPICAL_OIL | Status: DC | PRN
  Administered 2020-08-05: 1 via TOPICAL

## 2020-08-04 MED ORDER — SODIUM CHLORIDE 0.9 % IV SOLN
3.0000 g | Freq: Four times a day (QID) | INTRAVENOUS | Status: AC
  Administered 2020-08-04 (×2): 3 g via INTRAVENOUS
  Filled 2020-08-04 (×2): qty 3

## 2020-08-04 MED ORDER — WITCH HAZEL-GLYCERIN EX PADS: 1.0000 "application " | MEDICATED_PAD | CUTANEOUS | Status: DC | PRN

## 2020-08-04 MED ORDER — PRENATAL MULTIVITAMIN CH
1.0000 | ORAL_TABLET | Freq: Every day | ORAL | Status: DC
  Administered 2020-08-04 – 2020-08-06 (×3): 1 via ORAL
  Filled 2020-08-04 (×3): qty 1

## 2020-08-04 NOTE — Anesthesia Postprocedure Evaluation (Signed)
Anesthesia Post Note  Patient: Metallurgist  Procedure(s) Performed: AN AD HOC LABOR EPIDURAL     Patient location during evaluation: Mother Baby Anesthesia Type: Epidural Level of consciousness: awake, awake and alert and oriented Pain management: pain level controlled Vital Signs Assessment: post-procedure vital signs reviewed and stable Respiratory status: spontaneous breathing and respiratory function stable Cardiovascular status: blood pressure returned to baseline Postop Assessment: no headache, epidural receding, patient able to bend at knees, adequate PO intake, no backache, no apparent nausea or vomiting and able to ambulate Anesthetic complications: no   No complications documented.  Last Vitals:  Vitals:   08/04/20 0300 08/04/20 0420  BP: 127/74 124/76  Pulse: 74 74  Resp: 17 17  Temp: 37.2 C 37.2 C  SpO2: 97% 96%    Last Pain:  Vitals:   08/04/20 0500  TempSrc:   PainSc: 2    Pain Goal:                   Cleda Clarks

## 2020-08-04 NOTE — Lactation Note (Signed)
This note was copied from a baby's chart. Lactation Consultation Note Baby sleeping. Mom stated baby BF around 4. Encouraged mom to sleep while baby sleeps. Mom was standing at bedside looking at baby sleep when Momeyer entered rm. Mom is so excited.  Mom has DEBP Kit in room. Mom has used it for stimulation to help go in labor. Hand pump attachment and pump demonstrated to mom for pre-pumping d/t semi flat nipples. Compressible.  Hand expression taught w/dot of colostrum noted.  Newborn feeding habits, STS, supply and demand discussed.\ Mom encouraged to feed baby 8-12 times/24 hours and with feeding cues.   Shells given to mom. Encouraged to call for latch assistance. Mom stated she though baby had a shallow latch last feeding.  Lactation brochure given.  Patient Name: Jamie Bean [id number] Date: 08/04/2020 Reason for consult: Initial assessment;Primapara;Early term 37-38.6wks Age:37 hours  Maternal Data Has patient been taught Hand Expression?: Yes Does the patient have breastfeeding experience prior to this delivery?: No  Feeding    LATCH Score Latch: Repeated attempts needed to sustain latch, nipple held in mouth throughout feeding, stimulation needed to elicit sucking reflex.  Audible Swallowing: Spontaneous and intermittent  Type of Nipple: Flat  Comfort (Breast/Nipple): Soft / non-tender  Hold (Positioning): Assistance needed to correctly position infant at breast and maintain latch.  LATCH Score: 8   Lactation Tools Discussed/Used Tools: Shells;Pump Breast pump type: Manual Reason for Pumping: semi flat Pumping frequency: pre-pumping  Interventions Interventions: Support pillows;Hand express;Breast compression;Hand pump  Discharge    Consult Status Consult Status: Follow-up Date: 08/04/20 Follow-up type: In-patient    Theodoro Kalata 08/04/2020, 5:31 AM

## 2020-08-04 NOTE — Lactation Note (Signed)
This note was copied from a baby's chart. Lactation Consultation Note Dr. Jerilynn Som in w/mom. Stated needed more time to come back later.  Patient Name: Jamie Bean [id number] Date: 08/04/2020   Age:37 hours  Maternal Data    Feeding    LATCH Score                    Lactation Tools Discussed/Used    Interventions    Discharge    Consult Status      [name] 08/04/2020, 1:00 AM

## 2020-08-04 NOTE — Consult Note (Signed)
Called to delivery; sent immediately afterwards due to well-appearing baby.  Please contact NICU if questions or concerns.  Dineen Kid Leary Roca, MD Neonatologist 08/04/2020, 12:27 AM

## 2020-08-04 NOTE — Progress Notes (Signed)
Discussion with pt and husband about fetal wellbeing. FHT tachycardia and loss of variability. Options discussed, vacuum delivery per Dr Connye Burkitt or Cesarean section. R/B/A reviewed. Pt and husband requesting to continue pushing. Unasyn started for elevated temp and tylenol given. Pt making good progress when pushing. Will have NICU at delivery.

## 2020-08-04 NOTE — Lactation Note (Signed)
This note was copied from a baby's chart. Lactation Consultation Note LC attempted to see mom again before hr. Of birth. FOB holding baby doing Face time w/grandparents. Everyone excite. Mom said I could just see her on the [facility].  Patient Name: Girl Tomia Enlow [id number] Date: 08/04/2020   Age:37 hours  Maternal Data    Feeding    LATCH Score                    Lactation Tools Discussed/Used    Interventions    Discharge    Consult Status      [name] 08/04/2020, 1:17 AM

## 2020-08-04 NOTE — Social Work (Signed)
MOB was referred for history of anxiety.   * Referral screened out by Clinical Social Worker because none of the following criteria appear to apply:  ~ History of anxiety/depression during this pregnancy, or of post-partum depression following prior delivery. ~ Diagnosis of anxiety and/or depression within last 3 years. CSW reviewed records and notes a diagnosis date of 2019. OR * MOB's symptoms currently being treated with medication and/or therapy.  Please contact the Clinical Social Worker if needs arise, by [facility] request, or if MOB scores greater than 9/yes to question 10 on [place] Postpartum Depression Screen.  Manfred Arch, LCSWA Clinical Social Work [employer] [facility] [employer]  [phone]

## 2020-08-05 NOTE — Progress Notes (Signed)
Post Partum Day 1 PROM x 48 hours Treated with Unasyn x 24 hours Afebrile x 24 hours Borderline Bps, no prenatal history, labs WNL 3/28 Subjective: no complaints, up ad lib, voiding, tolerating PO and + flatus  Objective: Blood pressure (!) 146/73, pulse 66, temperature 98.1 F (36.7 C), temperature source Oral, resp. rate 18, height 5\' 9"  (1.753 m), weight 118.4 kg, last menstrual period 10/12/2019, SpO2 100 %, unknown if currently breastfeeding.  Physical Exam:  General: alert, cooperative, appears stated age and no distress Lochia: appropriate Uterine Fundus: firm Incision: na DVT Evaluation: No evidence of DVT seen on physical exam. Negative Homan's sign. No cords or calf tenderness. No significant calf/ankle edema.  Recent Labs    08/04/20 0743  HGB 10.5*  HCT 30.5*    Assessment/Plan: Plan for discharge tomorrow and Breastfeeding  Monitor BP accordingly, consider 1 week BP check appt PP Monitor for signs of infection DC tomorrow    LOS: 3 days   08/06/20 Areen Trautner 08/05/2020, 9:31 AM

## 2020-08-05 NOTE — Lactation Note (Addendum)
This note was copied from a baby's chart. Lactation Consultation Note  Patient Name: Jamie Bean [id number] Date: 08/05/2020 Reason for consult: Mother's request;Difficult latch;1st time breastfeeding;Early term 37-38.6wks;Nipple pain/trauma Age:37 hours  [name] was seen by [facility] student for assistance with latching infant. FOB present. Upon entry mother had infant latched for 2-3 minutes and expressed that it was painful. Every suck was painful. She was reassured that BF should not be painful and LC student would access latch. Infant had very narrow gape and was on the nipple. Infant was de-latched and LC student instructed and guided mother on how to point nipple to nose and latch infant chin first to ensure deeper latch. Infant positioning was also corrected, belly to belly, ears, shoulders, and hips aligned. Mother was given pillows for better support. Feeding started at 12:34pm. Mother seemed comfortable and expressed that she was no longer feeling pinching. FOB was very attentive and was shown how to assist with dropping infant's chin if needed. Rhythmic sucking and few swallows noted.   LC came in to room to access [facility] students and plan execution. 12:40pm It was noted that infant was now non-nutritive sucking. Small positional stripes were noted on both nipples. Latch was then corrected, bringing infant closer to breast and ensuring wide gape by pulling down chin. Infant began feeding at 12:45pm  Parents were educated on feeding cues, pacifier/bottle usage, positioning to provide head and neck support while feeding, how to improve infants latch, breast compressions, benefits of STS, and how to ensure adequate milk transfer and infant behavior. Parents verbalized understanding and was able to demonstrated back.   All questions and concerns of parents answered at this time. They are aware of lactation services.   Plan: - Continue to feed on demand, following feeding cues (8-12x in 24 hour  period) - Continue STS as much as possible  - Call of for assistance as need   Feeding Mother's Current Feeding Choice: Breast Milk  LATCH Score Latch: Grasps breast easily, tongue down, lips flanged, rhythmical sucking.  Audible Swallowing: A few with stimulation  Type of Nipple: Everted at rest and after stimulation  Comfort (Breast/Nipple): Soft / non-tender  Hold (Positioning): Assistance needed to correctly position infant at breast and maintain latch.  LATCH Score: 8   Lactation Tools Discussed/Used    Interventions Interventions: Breast feeding basics reviewed;Assisted with latch;Skin to skin;Hand express;Breast compression;Adjust position;Support pillows;Education  Discharge    Consult Status Consult Status: Follow-up Date: 08/06/20 Follow-up type: In-patient    [name] 08/05/2020, 1:16 PM

## 2020-08-06 DIAGNOSIS — O139 Gestational [pregnancy-induced] hypertension without significant proteinuria, unspecified trimester: Secondary | ICD-10-CM | POA: Diagnosis not present

## 2020-08-06 LAB — COMPREHENSIVE METABOLIC PANEL
ALT: 18 U/L (ref 0–44)
AST: 22 U/L (ref 15–41)
Albumin: 2.2 g/dL — ABNORMAL LOW (ref 3.5–5.0)
Alkaline Phosphatase: 62 U/L (ref 38–126)
Anion gap: 7 (ref 5–15)
BUN: 11 mg/dL (ref 6–20)
CO2: 21 mmol/L — ABNORMAL LOW (ref 22–32)
Calcium: 8.5 mg/dL — ABNORMAL LOW (ref 8.9–10.3)
Chloride: 108 mmol/L (ref 98–111)
Creatinine, Ser: 0.96 mg/dL (ref 0.44–1.00)
GFR, Estimated: >60 mL/min (ref >60)
Glucose, Bld: 85 mg/dL (ref 70–99)
Potassium: 4 mmol/L (ref 3.5–5.1)
Sodium: 136 mmol/L (ref 135–145)
Total Bilirubin: 0.6 mg/dL (ref 0.3–1.2)
Total Protein: 5.2 g/dL — ABNORMAL LOW (ref 6.5–8.1)

## 2020-08-06 LAB — CBC WITH DIFFERENTIAL/PLATELET
Abs Immature Granulocytes: 0.05 10*3/uL (ref 0.00–0.07)
Basophils Absolute: 0 10*3/uL (ref 0.0–0.1)
Basophils Relative: 1 %
Eosinophils Absolute: 0.2 10*3/uL (ref 0.0–0.5)
Eosinophils Relative: 3 %
HCT: 29.8 % — ABNORMAL LOW (ref 36.0–46.0)
Hemoglobin: 10 g/dL — ABNORMAL LOW (ref 12.0–15.0)
Immature Granulocytes: 1 %
Lymphocytes Relative: 28 %
Lymphs Abs: 1.9 10*3/uL (ref 0.7–4.0)
MCH: 28 pg (ref 26.0–34.0)
MCHC: 33.6 g/dL (ref 30.0–36.0)
MCV: 83.5 fL (ref 80.0–100.0)
Monocytes Absolute: 0.6 10*3/uL (ref 0.1–1.0)
Monocytes Relative: 9 %
Neutro Abs: 4.1 10*3/uL (ref 1.7–7.7)
Neutrophils Relative %: 58 %
Platelets: 157 10*3/uL (ref 150–400)
RBC: 3.57 MIL/uL — ABNORMAL LOW (ref 3.87–5.11)
RDW: 13.3 % (ref 11.5–15.5)
WBC: 6.8 10*3/uL (ref 4.0–10.5)
nRBC: 0 % (ref 0.0–0.2)

## 2020-08-06 LAB — SURGICAL PATHOLOGY

## 2020-08-06 LAB — PROTEIN / CREATININE RATIO, URINE
Creatinine, Urine: 54.97 mg/dL
Total Protein, Urine: <6 mg/dL

## 2020-08-06 MED ORDER — IBUPROFEN 600 MG PO TABS: 600.0000 mg | ORAL_TABLET | Freq: Four times a day (QID) | ORAL | 0 refills | Status: DC

## 2020-08-06 MED ORDER — NIFEDIPINE ER OSMOTIC RELEASE 30 MG PO TB24
30.0000 mg | ORAL_TABLET | Freq: Every day | ORAL | Status: DC
  Administered 2020-08-06: 30 mg via ORAL
  Filled 2020-08-06: qty 1

## 2020-08-06 MED ORDER — NIFEDIPINE ER 30 MG PO TB24: 30.0000 mg | ORAL_TABLET | Freq: Every day | ORAL | 1 refills | Status: DC

## 2020-08-06 NOTE — Discharge Summary (Signed)
SVD OB Discharge Summary     Patient Name: Jamie Bean DOB: 12-22-1985 MRN: [id number]  Date of admission: 08/02/2020 Delivering MD: Johney Frame B  Date of delivery: 08/04/2020 Type of delivery: SVD  Newborn Data: Sex: Baby female Live born female  Birth Weight: 6 lb 8.1 oz (2951 g) APGAR: 8, 9  Newborn Delivery   Birth date/time: 08/04/2020 00:23:00 Delivery type: Vaginal, Spontaneous      Feeding: breast Infant being discharge to home with mother in stable condition.   Admitting diagnosis: Indication for care in labor or delivery [O75.9] Intrauterine pregnancy: [redacted]w[redacted]d     Secondary diagnosis:  Active Problems:   Indication for care in labor or delivery   Normal postpartum course   Chorioamnionitis   SVD (spontaneous vaginal delivery)   Gestational hypertension                                Complications: Intrauterine Inflammation or infection (Chorioamniotis)                                                              Intrapartum Procedures: spontaneous vaginal delivery and GBS prophylaxis Postpartum Procedures: antibiotics Complications-Operative and Postpartum: periurthral repair Augmentation: Pitocin   History of Present Illness: Jamie Bean is a 35 y.o. female, G1P1001, who presents at [redacted]w[redacted]d weeks gestation. The patient has been followed at  [facility] and Gynecology  Her pregnancy has been complicated by:  Patient Active Problem List   Diagnosis Date Noted  . Gestational hypertension 08/06/2020  . Normal postpartum course 08/04/2020  . Chorioamnionitis 08/04/2020  . SVD (spontaneous vaginal delivery) 08/04/2020  . Indication for care in labor or delivery 08/02/2020  . Class 2 severe obesity with serious comorbidity and body mass index (BMI) of 35.0 to 35.9 in adult (HCC) 12/15/2019  . Vitamin D deficiency 10/09/2017    Hospital course:  Onset of Labor With Vaginal Delivery      35 y.o. yo G1P1001 at [redacted]w[redacted]d was  admitted in Latent Labor on 08/02/2020. Patient had an uncomplicated labor course as follows:  Membrane Rupture Time/Date: 11:15 PM ,08/01/2020   Delivery Method:Vaginal, Spontaneous  Episiotomy: None  Lacerations:  Periurethral  Patient had an uncomplicated postpartum course.  She is ambulating, tolerating a regular diet, passing flatus, and urinating well. Patient is discharged home in stable condition on 08/06/20.  Newborn Data: Birth date:08/04/2020  Birth time:12:23 AM  Gender:Female  Living status:Living  Apgars:8 ,9  Weight:2951 g  Postpartum Day # 2 : S/P NSVD due to pt was admitted on 3/28 for PROM, pt desired all natural progression, the augmented with pitocin, PROM >48 hours, suspected chorio, last fever >24 hours ago 102.3, received 24 hours unasyn. Has SVD on 3/30 over preurethral repaired, EBL 200, hgb drop of WNL. Dx with GHTN 4/1 and started on procardia 30 mg XL, tolerated well, BP currently 139/84. PCR was 0.25, other labs unremarkable. Patient up ad lib, denies syncope or dizziness. Reports consuming regular diet without issues and denies N/V. Patient reports 0 bowel movement + passing flatus.  Denies issues with urination and reports bleeding is "lighter."  Patient is breastfeeding and reports going well.  Desires undecided for postpartum contraception.  Pain is  being appropriately managed with use of po meds. H/O anxiety, no meds, mood stable, denies SI/HI.     Physical exam  Vitals:   08/06/20 0909 08/06/20 1036 08/06/20 1133 08/06/20 1409  BP: (!) 141/88 (!) 141/92 (!) 150/87 139/84  Pulse: 71 75 75 71  Resp: 18 18 19 18   Temp: 98.2 F (36.8 C) 98 F (36.7 C)  97.9 F (36.6 C)  TempSrc: Oral Oral  Oral  SpO2:  99% 99% 99%  Weight:      Height:       General: alert, cooperative and no distress Lochia: appropriate Uterine Fundus: firm Perineum: Intact DVT Evaluation: No evidence of DVT seen on physical exam. Negative Homan's sign. No cords or calf  tenderness. No significant calf/ankle edema.  Labs: Lab Results  Component Value Date   WBC 6.8 08/06/2020   HGB 10.0 (L) 08/06/2020   HCT 29.8 (L) 08/06/2020   MCV 83.5 08/06/2020   PLT 157 08/06/2020   CMP Latest Ref Rng & Units 08/06/2020  Glucose 70 - 99 mg/dL 85  BUN 6 - 20 mg/dL 11  Creatinine 10/06/2020 - 5.46 mg/dL 5.03  Sodium 5.46 - 568 mmol/L 136  Potassium 3.5 - 5.1 mmol/L 4.0  Chloride 98 - 111 mmol/L 108  CO2 22 - 32 mmol/L 21(L)  Calcium 8.9 - 10.3 mg/dL 127)  Total Protein 6.5 - 8.1 g/dL 5.2(L)  Total Bilirubin 0.3 - 1.2 mg/dL 0.6  Alkaline Phos 38 - 126 U/L 62  AST 15 - 41 U/L 22  ALT 0 - 44 U/L 18    Date of discharge: 08/06/2020 Discharge Diagnoses: Term Pregnancy-delivered and PROM x48 hours Discharge instruction: per After Visit Summary and "Baby and Me Booklet".  After visit meds:   Activity:           unrestricted and pelvic rest Advance as tolerated. Pelvic rest for 6 weeks.  Diet:                routine Medications: PNV, Ibuprofen and procardia 30 mg XL PO daily Postpartum contraception: Undecided Condition:  Pt discharge to home with baby in stable GHTN: BP in one week, report s/sx.  Anxiety Mood check in 2 week.   Meds: Allergies as of 08/06/2020      Reactions   Sulfa Antibiotics Hives      Medication List    STOP taking these medications   aspirin 81 MG EC tablet   clindamycin 1 % gel Commonly known as: CLINDAGEL     TAKE these medications   ferrous sulfate 325 (65 FE) MG tablet Take 325 mg by mouth at bedtime.   ibuprofen 600 MG tablet Commonly known as: ADVIL Take 1 tablet (600 mg total) by mouth every 6 (six) hours.   magnesium oxide 400 MG tablet Commonly known as: MAG-OX Take 400 mg by mouth at bedtime.   NIFEdipine 30 MG 24 hr tablet Commonly known as: ADALAT CC Take 1 tablet (30 mg total) by mouth daily. Start taking on: August 07, 2020   prenatal multivitamin Tabs tablet Take 1 tablet by mouth at bedtime.   PROBIOTIC  PO Take 1 capsule by mouth at bedtime. Probiotic prenatal due to recent pregnancy       Discharge Follow Up:   Follow-up Information    [facility] Follow up.   Specialty: Obstetrics and Gynecology Why: 1 weeks BP check and 6 week PPV Contact information: [address] [place]  [phone]               [facility], NP-C, CNM 08/06/2020, 3:27 PM  Dale [place], FNP

## 2020-08-06 NOTE — Lactation Note (Signed)
This note was copied from a baby's chart. Lactation Consultation Note  Patient Name: Jamie Bean [id number] Date: 08/06/2020   Age:37 hours / P 1 , 10 % weight loss ,  As LC entered the room baby latched with depth , per mom comfortable.  LC discussed nutritive vs non - nutritive feeding patterns and watch the baby for hanging out latched. LC reviewed the benefits of feeding the baby STS until the baby is back to birth weight, gaining steadily, and can stay awake for majority of the feeding.  Due to 10 % weight loss , areola edema,  LC recommended steps for prepping the breast prior to latching -  Breast massage, hand express, pre - pump to prime the milk ducts and stretch the nipple / areola complex/ reverse pressure/ firm support / and latch with compressions until swallows. LC also showed dad how he can help if needed  To obtain depth.  Per mom nipples are sore. LC assessed after feeding and noted a small  Positional strip on the left nipple and the left clear. Some areola edema.  Mom has breast shells. LC recommended for soreness , comfort gels after feedings and pumping and alternate with shells while awake. If soreness is not clear by 4 days after D/C to call for Lc O/PO apt and if weight  Is slow to increase.  Mom has the [facility] brochure with resource numbers.     Maternal Data    Feeding    LATCH Score Latch:  (latched with depth)  Audible Swallowing:  (swallows noted)     Comfort (Breast/Nipple):  (per mom comfortable)  Hold (Positioning):  (mom  independent)      Lactation Tools Discussed/Used    Interventions Interventions: Breast feeding basics reviewed;Assisted with latch;Skin to skin;Breast massage;Hand express;Adjust position;Support pillows;Position options;Shells;Comfort gels;Hand pump  Discharge Discharge Education: Engorgement and breast care;Warning signs for feeding baby Pump: Personal  Consult Status Consult Status: Complete Date:  08/06/20    [name] 08/06/2020, 11:16 AM

## 2020-08-15 ENCOUNTER — Inpatient Hospital Stay (HOSPITAL_COMMUNITY): Admit: 2020-08-15 | Payer: Self-pay

## 2020-09-06 ENCOUNTER — Encounter: Payer: 59 | Admitting: Family

## 2020-09-07 ENCOUNTER — Ambulatory Visit (INDEPENDENT_AMBULATORY_CARE_PROVIDER_SITE_OTHER): Payer: 59 | Admitting: Adult Health

## 2020-09-09 ENCOUNTER — Other Ambulatory Visit: Payer: Self-pay

## 2020-09-09 ENCOUNTER — Encounter: Payer: Self-pay | Admitting: Internal Medicine

## 2020-09-09 ENCOUNTER — Ambulatory Visit (INDEPENDENT_AMBULATORY_CARE_PROVIDER_SITE_OTHER): Payer: 59 | Admitting: Internal Medicine

## 2020-09-09 VITALS — BP 122/82 | HR 77 | Temp 98.1°F | Ht 68.9 in | Wt 237.1 lb

## 2020-09-09 DIAGNOSIS — Z1329 Encounter for screening for other suspected endocrine disorder: Secondary | ICD-10-CM

## 2020-09-09 DIAGNOSIS — E785 Hyperlipidemia, unspecified: Secondary | ICD-10-CM

## 2020-09-09 DIAGNOSIS — D509 Iron deficiency anemia, unspecified: Secondary | ICD-10-CM | POA: Diagnosis not present

## 2020-09-09 DIAGNOSIS — Z0000 Encounter for general adult medical examination without abnormal findings: Secondary | ICD-10-CM | POA: Diagnosis not present

## 2020-09-09 LAB — LIPID PANEL
Cholesterol: 226 mg/dL — ABNORMAL HIGH (ref 0–200)
HDL: 61.5 mg/dL (ref >39.00)
LDL Cholesterol: 154 mg/dL — ABNORMAL HIGH (ref 0–99)
NonHDL: 164.74
Total CHOL/HDL Ratio: 4
Triglycerides: 56 mg/dL (ref 0.0–149.0)
VLDL: 11.2 mg/dL (ref 0.0–40.0)

## 2020-09-09 LAB — TSH: TSH: 1.05 u[IU]/mL (ref 0.35–4.50)

## 2020-09-09 NOTE — Progress Notes (Signed)
Chief Complaint  Patient presents with  . Annual Exam   Annual doing well had 5 week and 1 day old girl was in labor 49 hrs and had vaginal delivery and gestational htn was on nifedipine 30 mg qd and 37 y.o son not biologically hers  Form for work filled out  Labs indicate low protein and low iron she is taking iron 1x per day    Review of Systems  Constitutional: Negative for weight loss.  HENT: Negative for hearing loss.   Eyes: Negative for blurred vision.  Respiratory: Negative for shortness of breath.   Cardiovascular: Negative for chest pain.  Gastrointestinal: Negative for abdominal pain.  Musculoskeletal: Negative for falls and joint pain.  Skin: Negative for rash.  Neurological: Negative for headaches.  Psychiatric/Behavioral: Negative for depression.   Past Medical History:  Diagnosis Date  . Anemia   . Anxiety   . Arthritis   . Constipation   . Female infertility   . Gestational HTN   . Joint pain   . Kidney problem   . Knee pain   . Palpitations   . UTI (urinary tract infection)   . Vitamin D deficiency    Past Surgical History:  Procedure Laterality Date  . INCISE AND DRAIN ABCESS     1994   Family History  Problem Relation Age of Onset  . Renal cancer Maternal Grandmother 70  . Alcohol abuse Maternal Grandmother   . Hyperlipidemia Maternal Grandmother   . Hypertension Maternal Grandmother   . Arthritis Father   . Obesity Father   . Alcohol abuse Maternal Uncle   . Mental illness Maternal Uncle   . Mental illness Paternal Aunt   . Diabetes Paternal Aunt   . Hyperlipidemia Maternal Grandfather   . Hypertension Maternal Grandfather   . Stroke Paternal Grandmother   . Diabetes Paternal Grandmother   . Cancer Paternal Grandfather        lung  . Diabetes Brother        dm2 dx'ed 32   . Colon cancer Neg Hx   . Breast cancer Neg Hx    Social History   Socioeconomic History  . Marital status: Married    Spouse name: Gala Romney  . Number of children:  Not on file  . Years of education: Not on file  . Highest education level: Not on file  Occupational History  . Occupation: Warden/ranger    Comment: [employer]  Tobacco Use  . Smoking status: Never Smoker  . Smokeless tobacco: Never Used  Vaping Use  . Vaping Use: Never used  Substance and Sexual Activity  . Alcohol use: Not Currently    Comment: occasional  . Drug use: No  . Sexual activity: Yes    Partners: Male  Other Topics Concern  . 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 CHERRY PO Take by mouth. Tart red cherry  . Cholecalciferol (  VITAMIN D3 PO) Take by mouth.  . ferrous sulfate 325 (65 FE) MG tablet Take 325 mg by mouth at bedtime.  [name] ibuprofen (ADVIL) 600 MG tablet Take 1 tablet (600 mg total) by mouth every 6 (six) hours.  . magnesium oxide (MAG-OX) 400 MG tablet Take 400 mg by mouth at bedtime.  Gerome Sam Oleifera (MORINGA PO) Take by mouth.  [name] NIFEdipine (ADALAT CC) 30 MG 24 hr tablet Take 1 tablet (30 mg total) by mouth daily.  . Omega-3 Fatty Acids (FISH OIL PO) Take by mouth.  . Prenatal Vit-Fe Fumarate-FA (PRENATAL MULTIVITAMIN) TABS tablet Take 1 tablet by mouth at bedtime.  . Probiotic Product (PROBIOTIC PO) Take 1 capsule by mouth at bedtime. Probiotic prenatal due to recent pregnancy  . Pyridoxine HCl (VITAMIN B-6 PO) Take by mouth.  . TURMERIC PO Take by mouth.   Allergies  Allergen Reactions  . Sulfa Antibiotics Hives   Recent Results (from the past 2160 hour(s))  Protein / creatinine ratio, urine      Status: None   Collection Time: 07/23/20  2:42 PM  Result Value Ref Range   Creatinine, Urine 93.15 mg/dL   Total Protein, Urine 13 mg/dL    Comment: NO NORMAL RANGE ESTABLISHED FOR THIS TEST   Protein Creatinine Ratio 0.14 0.00 - 0.15 mg/mg[Cre]    Comment: Performed at [facility], [address] [place], [place] [address]  CBC     Status: Abnormal   Collection Time: 07/23/20  3:01 PM  Result Value Ref Range   WBC 7.6 4.0 - 10.5 K/uL   RBC 4.27 3.87 - 5.11 MIL/uL   Hemoglobin 11.8 (L) 12.0 - 15.0 g/dL   HCT 60.4 (L) 54.0 - 98.1 %   MCV 83.4 80.0 - 100.0 fL   MCH 27.6 26.0 - 34.0 pg   MCHC 33.1 30.0 - 36.0 g/dL   RDW 19.1 47.8 - 29.5 %   Platelets 185 150 - 400 K/uL   nRBC 0.0 0.0 - 0.2 %    Comment: Performed at [facility], [address], [place], [place] [address]  Comprehensive metabolic panel     Status: Abnormal   Collection Time: 07/23/20  3:01 PM  Result Value Ref Range   Sodium 134 (L) 135 - 145 mmol/L   Potassium 3.9 3.5 - 5.1 mmol/L   Chloride 103 98 - 111 mmol/L   CO2 23 22 - 32 mmol/L   Glucose, Bld 91 70 - 99 mg/dL    Comment: Glucose reference range applies only to samples taken after fasting for at least 8 hours.   BUN 6 6 - 20 mg/dL   Creatinine, Ser 8.65 0.44 - 1.00 mg/dL   Calcium 9.1 8.9 - 78.4 mg/dL   Total Protein 6.3 (L) 6.5 - 8.1 g/dL   Albumin 2.9 (L) 3.5 - 5.0 g/dL   AST 17 15 - 41 U/L   ALT 16 0 - 44 U/L   Alkaline Phosphatase 75 38 - 126 U/L   Total Bilirubin 0.8 0.3 - 1.2 mg/dL   GFR, Estimated >69 >62 mL/min    Comment: (NOTE) Calculated using the CKD-EPI Creatinine Equation (2021)    Anion gap 8 5 - 15    Comment: Performed at [facility], [address] [place], [place] [address]  OB RESULT CONSOLE Group B Strep     Status: None   Collection Time: 07/24/20 12:00 AM  Result Value Ref Range   GBS Positive  POCT fern test     Status: None   Collection Time: 08/02/20 12:44 AM  Result Value Ref Range   POCT Fern  Test Positive = ruptured amniotic membanes   Protein / creatinine ratio, urine     Status: Abnormal   Collection Time: 08/02/20  1:02 AM  Result Value Ref Range   Creatinine, Urine 51.22 mg/dL   Total Protein, Urine 13 mg/dL    Comment: NO NORMAL RANGE ESTABLISHED FOR THIS TEST   Protein Creatinine Ratio 0.25 (H) 0.00 - 0.15 mg/mg[Cre]    Comment: Performed at [facility], [address] [place], [place] [address]  CBC     Status: Abnormal   Collection Time: 08/02/20  1:27 AM  Result Value Ref Range   WBC 8.1 4.0 - 10.5 K/uL   RBC 4.21 3.87 - 5.11 MIL/uL   Hemoglobin 11.9 (L) 12.0 - 15.0 g/dL   HCT 11.9 (L) 14.7 - 82.9 %   MCV 84.1 80.0 - 100.0 fL   MCH 28.3 26.0 - 34.0 pg   MCHC 33.6 30.0 - 36.0 g/dL   RDW 56.2 13.0 - 86.5 %   Platelets 191 150 - 400 K/uL   nRBC 0.0 0.0 - 0.2 %    Comment: Performed at [facility], [address], [place], [place] [address]  Type and screen [facility]     Status: None   Collection Time: 08/02/20  1:27 AM  Result Value Ref Range   ABO/RH(D) B POS    Antibody Screen NEG    Sample Expiration      08/05/2020,2359 Performed at [facility], [address], [place], [place] [address]   RPR     Status: None   Collection Time: 08/02/20  1:27 AM  Result Value Ref Range   RPR Ser Ql NON REACTIVE NON REACTIVE    Comment: Performed at [facility], [address] [place], [place] [address]  Resp Panel by RT-PCR (Flu A&B, Covid) Nasopharyngeal Swab     Status: None   Collection Time: 08/02/20  1:40 AM   Specimen: Nasopharyngeal Swab; Nasopharyngeal(NP) swabs in vial transport medium  Result Value Ref Range   SARS Coronavirus 2 by RT PCR NEGATIVE NEGATIVE    Comment: (NOTE) SARS-CoV-2 target nucleic acids are NOT DETECTED.  The SARS-CoV-2 RNA is generally detectable in upper respiratory specimens during the acute phase of infection. The lowest concentration of SARS-CoV-2 viral copies this assay can  detect is 138 copies/mL. A negative result does not preclude SARS-Cov-2 infection and should not be used as the sole basis for treatment or other patient management decisions. A negative result may occur with  improper specimen collection/handling, submission of specimen other than nasopharyngeal swab, presence of viral mutation(s) within the areas targeted by this assay, and inadequate number of viral copies(<138 copies/mL). A negative result must be combined with clinical observations, patient history, and epidemiological information. The expected result is Negative.  Fact Sheet for Patients:  BloggerCourse.com  Fact Sheet for Healthcare Providers:  SeriousBroker.it  This test is no t yet approved or cleared by the [place] FDA and  has been authorized for detection and/or diagnosis of SARS-CoV-2 by FDA under an Emergency Use Authorization (EUA). This EUA will remain  in effect (meaning this test can be used) for the duration of the COVID-19 declaration under Section 564(b)(1) of the Act, 21 U.S.C.section 360bbb-3(b)(1), unless the authorization is terminated  or revoked sooner.  Influenza A by PCR NEGATIVE NEGATIVE   Influenza B by PCR NEGATIVE NEGATIVE    Comment: (NOTE) The Xpert Xpress SARS-CoV-2/FLU/RSV plus assay is intended as an aid in the diagnosis of influenza from Nasopharyngeal swab specimens and should not be used as a sole basis for treatment. Nasal washings and aspirates are unacceptable for Xpert Xpress SARS-CoV-2/FLU/RSV testing.  Fact Sheet for Patients: BloggerCourse.com  Fact Sheet for Healthcare Providers: SeriousBroker.it  This test is not yet approved or cleared by the [place] FDA and has been authorized for detection and/or diagnosis of SARS-CoV-2 by FDA under an Emergency Use Authorization (EUA). This EUA will remain in effect  (meaning this test can be used) for the duration of the COVID-19 declaration under Section 564(b)(1) of the Act, 21 U.S.C. section 360bbb-3(b)(1), unless the authorization is terminated or revoked.  Performed at [facility], [address] [place], [place] [address]   Comprehensive metabolic panel     Status: Abnormal   Collection Time: 08/02/20  7:39 AM  Result Value Ref Range   Sodium 133 (L) 135 - 145 mmol/L   Potassium 3.9 3.5 - 5.1 mmol/L   Chloride 104 98 - 111 mmol/L   CO2 21 (L) 22 - 32 mmol/L   Glucose, Bld 97 70 - 99 mg/dL    Comment: Glucose reference range applies only to samples taken after fasting for at least 8 hours.   BUN 7 6 - 20 mg/dL   Creatinine, Ser 1.01 0.44 - 1.00 mg/dL   Calcium 9.3 8.9 - 75.1 mg/dL   Total Protein 6.0 (L) 6.5 - 8.1 g/dL   Albumin 2.8 (L) 3.5 - 5.0 g/dL   AST 18 15 - 41 U/L   ALT 16 0 - 44 U/L   Alkaline Phosphatase 70 38 - 126 U/L   Total Bilirubin 0.5 0.3 - 1.2 mg/dL   GFR, Estimated >02 >58 mL/min    Comment: (NOTE) Calculated using the CKD-EPI Creatinine Equation (2021)    Anion gap 8 5 - 15    Comment: Performed at [facility], [address], [place], [place] [address]  Surgical pathology     Status: None   Collection Time: 08/04/20  1:00 AM  Result Value Ref Range   SURGICAL PATHOLOGY      SURGICAL PATHOLOGY CASE: [id number] PATIENT: [facility] Surgical Pathology Report     Clinical History: Chorio, velamentous cord, marginal cord insertion (crm)     FINAL MICROSCOPIC DIAGNOSIS:  A. PLACENTA, SINGLETON, DELIVERY: - Placenta, 487 g. - Decidual cyst. - Three-vessel umbilical cord.   GROSS DESCRIPTION:  Specimen received: Fresh Size and shape: An ovoid singleton placenta measuring 16.7 x 13.0 x 4.0 cm Weight: 487 g Umbilical cord: Trivascular umbilical cord is tan-white, rubbery, 17.5 cm in length, averages 1.1 cm in diameter, and inserts 3.0 cm the closest placental  margin. Membranes: Membranes are f inserted, tan-pink, smooth, slightly thickened and opaque with a minimal amount of adherent red-brown clotted blood on the maternal aspect Fetal surface: Tan blue, smooth, the vessels predominantly coarse around the periphery of the disc.  There is a 7.5 x 5.0 cm subamniotic area of hemorrhage noted, surrounding the umbilical cord ins ertion point. Maternal surface: Maternal surface is focally disrupted but appears complete with red-brown cotyledons, minimal calcifications, and a minimal amount of adherent red-brown clotted blood Cut surface: Spongy red-brown, with a 1.5 cm in greatest dimension tan-yellow, gelatinous possible lesion identified within the periphery of the disc.  Lesion occupies less than 5% of the total placental volume. Block summary: 4 blocks submitted 1 = umbilical cord and membranes 2-4 = placenta, to include gelatinous lesion  Lovey Newcomer([place] 08/05/2020)    Final Diagnosis performed by Jimmy PicketJohn Patrick, MD.   Electronically signed 08/06/2020 Technical component performed at [facility], [address] [place], [place] [address].  Professional component performed at [facility], [address], [place], [place] [address].  Immunohistochemistry Technical component (if applicable) was performed at [facility]. [address], [place], [place] [address].   IMMUNOHISTOCHEMISTRY DISCLAIMER (if applicable): Some of these immunohistochemical stains may have been developed and the performance characteristics determine by [facility]. Some may not have been cleared or approved by the U.S. Food and Drug Administration. The FDA has determined that such clearance or approval is not necessary. This test is used for clinical purposes. It should not be regarded as investigational or for research. This laboratory is certified under the Clinical Laboratory Improvement Amendments  of 1988 (CLIA-88) as qualified to perform high complexity clinical laboratory testing.  The controls stained appropriately.   CBC     Status: Abnormal   Collection Time: 08/04/20  7:43 AM  Result Value Ref Range   WBC 16.5 (H) 4.0 - 10.5 K/uL   RBC 3.64 (L) 3.87 - 5.11 MIL/uL   Hemoglobin 10.5 (L) 12.0 - 15.0 g/dL   HCT 91.430.5 (L) 78.236.0 - 95.646.0 %   MCV 83.8 80.0 - 100.0 fL   MCH 28.8 26.0 - 34.0 pg   MCHC 34.4 30.0 - 36.0 g/dL   RDW 21.313.3 08.611.5 - 57.815.5 %   Platelets 165 150 - 400 K/uL   nRBC 0.0 0.0 - 0.2 %    Comment: Performed at [facility], [address] [place], [place] [address]  Comprehensive metabolic panel     Status: Abnormal   Collection Time: 08/06/20  7:19 AM  Result Value Ref Range   Sodium 136 135 - 145 mmol/L   Potassium 4.0 3.5 - 5.1 mmol/L   Chloride 108 98 - 111 mmol/L   CO2 21 (L) 22 - 32 mmol/L   Glucose, Bld 85 70 - 99 mg/dL    Comment: Glucose reference range applies only to samples taken after fasting for at least 8 hours.   BUN 11 6 - 20 mg/dL   Creatinine, Ser 9.520.96 0.44 - 1.00 mg/dL   Calcium 8.5 (L) 8.9 - 10.3 mg/dL   Total Protein 5.2 (L) 6.5 - 8.1 g/dL   Albumin 2.2 (L) 3.5 - 5.0 g/dL   AST 22 15 - 41 U/L   ALT 18 0 - 44 U/L   Alkaline Phosphatase 62 38 - 126 U/L   Total Bilirubin 0.6 0.3 - 1.2 mg/dL   GFR, Estimated >84>60 >13>60 mL/min    Comment: (NOTE) Calculated using the CKD-EPI Creatinine Equation (2021)    Anion gap 7 5 - 15    Comment: Performed at [facility], [address] [place], [place] [address]  CBC with Differential/Platelet     Status: Abnormal   Collection Time: 08/06/20  7:19 AM  Result Value Ref Range   WBC 6.8 4.0 - 10.5 K/uL   RBC 3.57 (L) 3.87 - 5.11 MIL/uL   Hemoglobin 10.0 (L) 12.0 - 15.0 g/dL   HCT 02.729.8 (L) 25.336.0 - 66.446.0 %   MCV 83.5 80.0 - 100.0 fL   MCH 28.0 26.0 - 34.0  pg   MCHC 33.6 30.0 - 36.0 g/dL   RDW 16.1 09.6 - 04.5 %   Platelets 157 150 - 400 K/uL   nRBC 0.0 0.0 - 0.2 %   Neutrophils Relative  % 58 %   Neutro Abs 4.1 1.7 - 7.7 K/uL   Lymphocytes Relative 28 %   Lymphs Abs 1.9 0.7 - 4.0 K/uL   Monocytes Relative 9 %   Monocytes Absolute 0.6 0.1 - 1.0 K/uL   Eosinophils Relative 3 %   Eosinophils Absolute 0.2 0.0 - 0.5 K/uL   Basophils Relative 1 %   Basophils Absolute 0.0 0.0 - 0.1 K/uL   Immature Granulocytes 1 %   Abs Immature Granulocytes 0.05 0.00 - 0.07 K/uL    Comment: Performed at [facility], [address] [place], [place] [address]  Protein / creatinine ratio, urine     Status: None   Collection Time: 08/06/20  9:08 AM  Result Value Ref Range   Creatinine, Urine 54.97 mg/dL   Total Protein, Urine <6 mg/dL   Protein Creatinine Ratio        0.00 - 0.15 mg/mg[Cre]    Comment: RESULT BELOW REPORTABLE RANGE, UNABLE TO CALCULATE. Performed at [facility], [address], [place], [place] [address]    Objective  Body mass index is 35.12 kg/m. Wt Readings from Last 3 Encounters:  09/09/20 237 lb 1.9 oz (107.6 kg)  08/02/20 261 lb (118.4 kg)  07/27/20 253 lb (114.8 kg)   Temp Readings from Last 3 Encounters:  09/09/20 98.1 F (36.7 C) (Oral)  08/06/20 98.1 F (36.7 C) (Oral)  07/27/20 98.7 F (37.1 C)   BP Readings from Last 3 Encounters:  09/09/20 122/82  08/06/20 (!) 146/93  07/27/20 110/71   Pulse Readings from Last 3 Encounters:  09/09/20 77  08/06/20 81  07/27/20 74    Physical Exam Vitals and nursing note reviewed.  Constitutional:      Appearance: Normal appearance. She is well-developed and well-groomed. She is obese.  HENT:     Head: Normocephalic and atraumatic.  Cardiovascular:     Rate and Rhythm: Normal rate and regular rhythm.     Heart sounds: Normal heart sounds. No murmur heard.   Pulmonary:     Effort: Pulmonary effort is normal.     Breath sounds: Normal breath sounds.  Skin:    General: Skin is warm and dry.  Neurological:     General: No focal deficit present.     Mental Status: She is alert and  oriented to person, place, and time. Mental status is at baseline.     Gait: Gait normal.  Psychiatric:        Attention and Perception: Attention and perception normal.        Mood and Affect: Mood and affect normal.        Speech: Speech normal.        Behavior: Behavior normal. Behavior is cooperative.        Thought Content: Thought content normal.        Cognition and Memory: Cognition and memory normal.        Judgment: Judgment normal.     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 French Ana McLean-Scocuzza-Internal Medicine

## 2020-09-09 NOTE — Patient Instructions (Addendum)
Premier protein shakes  Iron 1-2 x per day with Vitamin C 500 mg daily  Vitamin D3 1000 -2000 IU max daily   Iron Deficiency Anemia, Adult Iron deficiency anemia is when you do not have enough red blood cells or hemoglobin in your blood. This happens because you have too little iron in your body. Hemoglobin carries oxygen to parts of the body. Anemia can cause your body to not get enough oxygen. What are the causes?  Not eating enough foods that have iron in them.  The body not being able to take in iron well.  Needing more iron due to pregnancy or heavy menstrual periods, for females.  Cancer.  Bleeding in the bowels.  Many blood draws. What increases the risk?  Being pregnant.  Being a teenage girl going through a growth spurt. What are the signs or symptoms?  Pale skin, lips, and nails.  Weakness, dizziness, and getting tired easily.  Headache.  Feeling like you cannot breathe well when moving (shortness of breath).  Cold hands and feet.  Fast heartbeat or a heartbeat that is not regular.  Feeling grouchy (irritable) or breathing fast. These are more common in very bad anemia. Mild anemia may not cause any symptoms. How is this treated? This condition is treated by finding out why you do not have enough iron and then getting more iron. It may include:  Adding foods to your diet that have a lot of iron.  Taking iron pills (supplements). If you are pregnant or breastfeeding, you may need to take extra iron. Your diet often does not provide the amount of iron that you need.  Getting more vitamin C in your diet. Vitamin C helps your body take in iron. You may need to take iron pills with a glass of orange juice or vitamin C pills.  Medicines to make heavy menstrual periods lighter.  Surgery. You may need blood tests to see if treatment is working. If the treatment does not seem to be working, you may need more tests. Follow these instructions at  home: Medicines  Take over-the-counter and prescription medicines only as told by your doctor. This includes iron pills and vitamins. ? Take iron pills when your stomach is empty. If you cannot handle this, take them with food. ? Do not drink milk or take antacids at the same time as your iron pills. ? Iron pills may turn your poop (stool)black.  If you cannot handle taking iron pills by mouth, ask your doctor about getting iron through: ? An IV tube. ? A shot (injection) into a muscle. Eating and drinking  Talk with your doctor before changing the foods you eat. He or she may tell you to eat foods that have a lot of iron, such as: ? Liver. ? Low-fat (lean) beef. ? Breads and cereals that have iron added to them. ? Eggs. ? Dried fruit. ? Dark green, leafy vegetables.  Eat fresh fruits and vegetables that are high in vitamin C. They help your body use iron. Foods with a lot of vitamin C include: ? Oranges. ? Peppers. ? Tomatoes. ? Mangoes.  Drink enough fluid to keep your pee (urine) pale yellow.   Managing constipation If you are taking iron pills, they may cause trouble pooping (constipation). To prevent or treat trouble pooping, you may need to:  Take over-the-counter or prescription medicines.  Eat foods that are high in fiber. These include beans, whole grains, and fresh fruits and vegetables.  Limit foods that  are high in fat and sugar. These include fried or sweet foods. General instructions  Return to your normal activities as told by your doctor. Ask your doctor what activities are safe for you.  Keep yourself clean, and keep things clean around you.  Keep all follow-up visits as told by your doctor. This is important. Contact a doctor if:  You feel like you may vomit (nauseous), or you vomit.  You feel weak.  You are sweating for no reason.  You have trouble pooping, such as: ? Pooping less than 3 times a week. ? Straining to poop. ? Having poop that is  hard, dry, or larger than normal. ? Feeling full or bloated. ? Pain in the lower belly. ? Not feeling better after pooping. Get help right away if:  You pass out (faint).  You have chest pain.  You have trouble breathing that: ? Is very bad. ? Gets worse with physical activity.  You have a fast heartbeat, or a heartbeat that does not feel regular.  You get light-headed when getting up from sitting or lying down. These symptoms may be an emergency. Do not wait to see if the symptoms will go away. Get medical help right away. Call your local emergency services (911 in the [place] Do not drive yourself to the hospital. Summary  Iron deficiency anemia is when you have too little iron in your body.  This condition is treated by finding out why you do not have enough iron in your body and then getting more iron.  Take over-the-counter and prescription medicines only as told by your doctor.  Eat fresh fruits and vegetables that are high in vitamin C.  Get help right away if you cannot breathe well. This information is not intended to replace advice given to you by your health care provider. Make sure you discuss any questions you have with your health care provider. Document Revised: 12/31/2018 Document Reviewed: 12/31/2018 Elsevier Patient Education  2021 [employer] Inc[employer]    High Cholesterol  High cholesterol is a condition in which the blood has high levels of a white, waxy substance similar to fat (cholesterol). The liver makes all the cholesterol that the body needs. The human body needs small amounts of cholesterol to help build cells. A person gets extra or excess cholesterol from the food that he or she eats. The blood carries cholesterol from the liver to the rest of the body. If you have high cholesterol, deposits (plaques) may build up on the walls of your arteries. Arteries are the blood vessels that carry blood away from your heart. These plaques make the arteries narrow and  stiff. Cholesterol plaques increase your risk for heart attack and stroke. Work with your health care provider to keep your cholesterol levels in a healthy range. What increases the risk? The following factors may make you more likely to develop this condition:  Eating foods that are high in animal fat (saturated fat) or cholesterol.  Being overweight.  Not getting enough exercise.  A family history of high cholesterol (familial hypercholesterolemia).  Use of tobacco products.  Having diabetes. What are the signs or symptoms? There are no symptoms of this condition. How is this diagnosed? This condition may be diagnosed based on the results of a blood test.  If you are older than 37 years of age, your health care provider may check your cholesterol levels every 4-6 years.  You may be checked more often if you have high cholesterol or other risk  factors for heart disease. The blood test for cholesterol measures:  "Bad" cholesterol, or LDL cholesterol. This is the main type of cholesterol that causes heart disease. The desired level is less than 100 mg/dL.  "Good" cholesterol, or HDL cholesterol. HDL helps protect against heart disease by cleaning the arteries and carrying the LDL to the liver for processing. The desired level for HDL is 60 mg/dL or higher.  Triglycerides. These are fats that your body can store or burn for energy. The desired level is less than 150 mg/dL.  Total cholesterol. This measures the total amount of cholesterol in your blood and includes LDL, HDL, and triglycerides. The desired level is less than 200 mg/dL. How is this treated? This condition may be treated with:  Diet changes. You may be asked to eat foods that have more fiber and less saturated fats or added sugar.  Lifestyle changes. These may include regular exercise, maintaining a healthy weight, and quitting use of tobacco products.  Medicines. These are given when diet and lifestyle changes have  not worked. You may be prescribed a statin medicine to help lower your cholesterol levels. Follow these instructions at home: Eating and drinking  Eat a healthy, balanced diet. This diet includes: ? Daily servings of a variety of fresh, frozen, or canned fruits and vegetables. ? Daily servings of whole grain foods that are rich in fiber. ? Foods that are low in saturated fats and trans fats. These include poultry and fish without skin, lean cuts of meat, and low-fat dairy products. ? A variety of fish, especially oily fish that contain omega-3 fatty acids. Aim to eat fish at least 2 times a week.  Avoid foods and drinks that have added sugar.  Use healthy cooking methods, such as roasting, grilling, broiling, baking, poaching, steaming, and stir-frying. Do not fry your food except for stir-frying.   Lifestyle  Get regular exercise. Aim to exercise for a total of 150 minutes a week. Increase your activity level by doing activities such as gardening, walking, and taking the stairs.  Do not use any products that contain nicotine or tobacco, such as cigarettes, e-cigarettes, and chewing tobacco. If you need help quitting, ask your health care provider.   General instructions  Take over-the-counter and prescription medicines only as told by your health care provider.  Keep all follow-up visits as told by your health care provider. This is important. Where to find more information  American Heart Association: www.heart.org  National Heart, Lung, and Blood Institute: [employer] Contact a health care provider if:  You have trouble achieving or maintaining a healthy diet or weight.  You are starting an exercise program.  You are unable to stop smoking. Get help right away if:  You have chest pain.  You have trouble breathing.  You have any symptoms of a stroke. "BE FAST" is an easy way to remember the main warning signs of a stroke: ? B - Balance. Signs are dizziness, sudden  trouble walking, or loss of balance. ? E - Eyes. Signs are trouble seeing or a sudden change in vision. ? F - Face. Signs are sudden weakness or numbness of the face, or the face or eyelid drooping on one side. ? A - Arms. Signs are weakness or numbness in an arm. This happens suddenly and usually on one side of the body. ? S - Speech. Signs are sudden trouble speaking, slurred speech, or trouble understanding what people say. ? T - Time. Time to call emergency  services. Write down what time symptoms started.  You have other signs of a stroke, such as: ? A sudden, severe headache with no known cause. ? Nausea or vomiting. ? Seizure. These symptoms may represent a serious problem that is an emergency. Do not wait to see if the symptoms will go away. Get medical help right away. Call your local emergency services (911 in the [place] Do not drive yourself to the hospital. Summary  Cholesterol plaques increase your risk for heart attack and stroke. Work with your health care provider to keep your cholesterol levels in a healthy range.  Eat a healthy, balanced diet, get regular exercise, and maintain a healthy weight.  Do not use any products that contain nicotine or tobacco, such as cigarettes, e-cigarettes, and chewing tobacco.  Get help right away if you have any symptoms of a stroke. This information is not intended to replace advice given to you by your health care provider. Make sure you discuss any questions you have with your health care provider. Document Revised: 03/24/2019 Document Reviewed: 03/24/2019 Elsevier Patient Education  2021 Elsevier Inc.    Cholesterol Content in Foods Cholesterol is a waxy, fat-like substance that helps to carry fat in the blood. The body needs cholesterol in small amounts, but too much cholesterol can cause damage to the arteries and heart. Most people should eat less than 200 milligrams (mg) of cholesterol a day. Foods with cholesterol Cholesterol  is found in animal-based foods, such as meat, seafood, and dairy. Generally, low-fat dairy and lean meats have less cholesterol than full-fat dairy and fatty meats. The milligrams of cholesterol per serving (mg per serving) of common cholesterol-containing foods are listed below. Meat and other proteins  Egg -- one large whole egg has 186 mg.  Veal shank -- 4 oz has 141 mg.  Lean ground [place] (93% lean) -- 4 oz has 118 mg.  Fat-trimmed lamb loin -- 4 oz has 106 mg.  Lean ground beef (90% lean) -- 4 oz has 100 mg.  Lobster -- 3.5 oz has 90 mg.  Pork loin chops -- 4 oz has 86 mg.  Canned salmon -- 3.5 oz has 83 mg.  Fat-trimmed beef top loin -- 4 oz has 78 mg.  Frankfurter -- 1 frank (3.5 oz) has 77 mg.  Crab -- 3.5 oz has 71 mg.  Roasted chicken without skin, white meat -- 4 oz has 66 mg.  Light bologna -- 2 oz has 45 mg.  Deli-cut [place]key -- 2 oz has 31 mg.  Canned tuna -- 3.5 oz has 31 mg.  Tomasa BlaseBacon -- 1 oz has 29 mg.  Oysters and mussels (raw) -- 3.5 oz has 25 mg.  Mackerel -- 1 oz has 22 mg.  Trout -- 1 oz has 20 mg.  Pork sausage -- 1 link (1 oz) has 17 mg.  Salmon -- 1 oz has 16 mg.  Tilapia -- 1 oz has 14 mg. Dairy  Soft-serve ice cream --  cup (4 oz) has 103 mg.  Whole-milk yogurt -- 1 cup (8 oz) has 29 mg.  Cheddar cheese -- 1 oz has 28 mg.  American cheese -- 1 oz has 28 mg.  Whole milk -- 1 cup (8 oz) has 23 mg.  2% milk -- 1 cup (8 oz) has 18 mg.  Cream cheese -- 1 tablespoon (Tbsp) has 15 mg.  Cottage cheese --  cup (4 oz) has 14 mg.  Low-fat (1%) milk -- 1 cup (8 oz) has 10  mg.  Sour cream -- 1 Tbsp has 8.5 mg.  Low-fat yogurt -- 1 cup (8 oz) has 8 mg.  Nonfat Greek yogurt -- 1 cup (8 oz) has 7 mg.  Half-and-half cream -- 1 Tbsp has 5 mg. Fats and oils  Cod liver oil -- 1 tablespoon (Tbsp) has 82 mg.  Butter -- 1 Tbsp has 15 mg.  Lard -- 1 Tbsp has 14 mg.  Bacon grease -- 1 Tbsp has 14 mg.  Mayonnaise -- 1 Tbsp has 5-10  mg.  Margarine -- 1 Tbsp has 3-10 mg. Exact amounts of cholesterol in these foods may vary depending on specific ingredients and brands.   Foods without cholesterol Most plant-based foods do not have cholesterol unless you combine them with a food that has cholesterol. Foods without cholesterol include:  Grains and cereals.  Vegetables.  Fruits.  Vegetable oils, such as olive, canola, and sunflower oil.  Legumes, such as peas, beans, and lentils.  Nuts and seeds.  Egg whites.   Summary  The body needs cholesterol in small amounts, but too much cholesterol can cause damage to the arteries and heart.  Most people should eat less than 200 milligrams (mg) of cholesterol a day. This information is not intended to replace advice given to you by your health care provider. Make sure you discuss any questions you have with your health care provider. Document Revised: 09/15/2019 Document Reviewed: 09/15/2019 Elsevier Patient Education  2021 [employer].

## 2020-09-10 LAB — IRON,TIBC AND FERRITIN PANEL
%SAT: 47 % (calc) — ABNORMAL HIGH (ref 16–45)
Ferritin: 55 ng/mL (ref 16–154)
Iron: 133 ug/dL (ref 40–190)
TIBC: 285 mcg/dL (calc) (ref 250–450)

## 2020-09-16 ENCOUNTER — Encounter (INDEPENDENT_AMBULATORY_CARE_PROVIDER_SITE_OTHER): Payer: Self-pay | Admitting: Adult Health

## 2020-09-16 ENCOUNTER — Other Ambulatory Visit: Payer: Self-pay

## 2020-09-16 ENCOUNTER — Ambulatory Visit (INDEPENDENT_AMBULATORY_CARE_PROVIDER_SITE_OTHER): Payer: 59 | Admitting: Adult Health

## 2020-09-16 VITALS — BP 119/67 | HR 74 | Temp 98.3°F | Ht 69.0 in | Wt 237.0 lb

## 2020-09-16 DIAGNOSIS — E559 Vitamin D deficiency, unspecified: Secondary | ICD-10-CM | POA: Diagnosis not present

## 2020-09-16 DIAGNOSIS — E66812 Obesity, class 2: Secondary | ICD-10-CM

## 2020-09-16 DIAGNOSIS — Z6835 Body mass index (BMI) 35.0-35.9, adult: Secondary | ICD-10-CM

## 2020-09-16 DIAGNOSIS — D508 Other iron deficiency anemias: Secondary | ICD-10-CM

## 2020-09-20 NOTE — Progress Notes (Signed)
Chief Complaint:   OBESITY Jamie Bean is here to discuss her progress with her obesity treatment plan along with follow-up of her obesity related diagnoses. Jamie Bean is on following a lower carbohydrate, vegetable and lean protein rich diet plan and states she is following her eating plan approximately 0% of the time. Jamie Bean states she is not currently exercising.  Today's visit was #: 32 Starting weight: 218 lbs Starting date: 10/09/2017 Today's weight: 237 lbs Today's date: 09/16/2020 Total lbs lost to date: 0 Total lbs lost since last in-office visit: 16  Interim History: 09/09/2019 RMR 1946 Her water broke August 01, 2020. Jamie Bean delivered a healthy baby girl, named Emory, on August 04, 2020.   She is breastfeeding "all day".  She has been loosely following a "Low CHO plan".    Subjective:   1. Vitamin D deficiency Jamie Bean's Vitamin D level was 30.7 on 04/06/2020, which is below goal of 50. She is currently taking a prenatal vitamin. She denies nausea, vomiting or muscle weakness.  2. Other iron deficiency anemia Jamie Bean is on ferrous sulfate 325 mg QD. She denies constipation.  Assessment/Plan:   1. Vitamin D deficiency Low Vitamin D level contributes to fatigue and are associated with obesity, breast, and colon cancer. She agrees to continue to take prenatal multivitamin vitamin and will follow-up for routine testing of Vitamin D, at least 2-3 times per year to avoid over-replacement.  2. Other iron deficiency anemia Continue oral iron supplement.  3. Class 2 severe obesity with serious comorbidity and body mass index (BMI) of 35.0 to 35.9 in adult, unspecified obesity type (HCC)  Jamie Bean is currently in the action stage of change. As such, her goal is to continue with weight loss efforts. She has agreed to following a lower carbohydrate, vegetable and lean protein rich diet plan.   Continue PC/[place]- low CHO eating. Remain well hydrated.    Check IC at next OV. Pt is  aware to arrive 30 minutes prior to OV.  Exercise goals: Advance as tolerated.  Behavioral modification strategies: increasing lean protein intake, decreasing simple carbohydrates, increasing water intake, meal planning and cooking strategies and planning for success.  Jamie Bean has agreed to follow-up with our clinic in 4 weeks (IC/Fasting). She was informed of the importance of frequent follow-up visits to maximize her success with intensive lifestyle modifications for her multiple health conditions.   Objective:   Blood pressure 119/67, pulse 74, temperature 98.3 F (36.8 C), height 5\' 9"  (1.753 m), weight 237 lb (107.5 kg), last menstrual period 10/12/2019, SpO2 97 %, unknown if currently breastfeeding. Body mass index is 35 kg/m.  General: Cooperative, alert, well developed, in no acute distress. HEENT: Conjunctivae and lids unremarkable. Cardiovascular: Regular rhythm.  Lungs: Normal work of breathing. Neurologic: No focal deficits.   Lab Results  Component Value Date   CREATININE 0.96 08/06/2020   BUN 11 08/06/2020   NA 136 08/06/2020   K 4.0 08/06/2020   CL 108 08/06/2020   CO2 21 (L) 08/06/2020   Lab Results  Component Value Date   ALT 18 08/06/2020   AST 22 08/06/2020   ALKPHOS 62 08/06/2020   BILITOT 0.6 08/06/2020   Lab Results  Component Value Date   HGBA1C 5.0 09/05/2019   HGBA1C 5.2 07/01/2018   HGBA1C 5.4 02/04/2018   HGBA1C 5.5 10/09/2017   HGBA1C 5.4 08/31/2017   Lab Results  Component Value Date   INSULIN 7.3 07/01/2018   INSULIN 6.9 02/04/2018   INSULIN 5.8 10/09/2017  Lab Results  Component Value Date   TSH 1.05 09/09/2020   Lab Results  Component Value Date   CHOL 226 (H) 09/09/2020   HDL 61.50 09/09/2020   LDLCALC 154 (H) 09/09/2020   TRIG 56.0 09/09/2020   CHOLHDL 4 09/09/2020   Lab Results  Component Value Date   WBC 6.8 08/06/2020   HGB 10.0 (L) 08/06/2020   HCT 29.8 (L) 08/06/2020   MCV 83.5 08/06/2020   PLT 157  08/06/2020   Lab Results  Component Value Date   IRON 133 09/09/2020   TIBC 285 09/09/2020   FERRITIN 55 09/09/2020     Attestation Statements:   Reviewed by clinician on day of visit: allergies, medications, problem list, medical history, surgical history, family history, social history, and previous encounter notes.  Time spent on visit including pre-visit chart review and post-visit care and charting was 30 minutes.   Edmund Hilda, CMA, am acting as transcriptionist for William Hamburger, NP.  I have reviewed the above documentation for accuracy and completeness, and I agree with the above. -  Johnattan Strassman d. Amye Grego, NP-C

## 2020-10-11 ENCOUNTER — Ambulatory Visit (INDEPENDENT_AMBULATORY_CARE_PROVIDER_SITE_OTHER): Payer: 59 | Admitting: Adult Health

## 2020-10-11 ENCOUNTER — Encounter (INDEPENDENT_AMBULATORY_CARE_PROVIDER_SITE_OTHER): Payer: Self-pay | Admitting: Adult Health

## 2020-10-11 ENCOUNTER — Other Ambulatory Visit: Payer: Self-pay

## 2020-10-11 VITALS — BP 108/73 | HR 69 | Temp 98.2°F | Ht 69.0 in | Wt 243.0 lb

## 2020-10-11 DIAGNOSIS — D508 Other iron deficiency anemias: Secondary | ICD-10-CM | POA: Diagnosis not present

## 2020-10-11 DIAGNOSIS — E559 Vitamin D deficiency, unspecified: Secondary | ICD-10-CM | POA: Diagnosis not present

## 2020-10-11 DIAGNOSIS — E785 Hyperlipidemia, unspecified: Secondary | ICD-10-CM | POA: Diagnosis not present

## 2020-10-11 DIAGNOSIS — E66812 Obesity, class 2: Secondary | ICD-10-CM

## 2020-10-11 DIAGNOSIS — Z6835 Body mass index (BMI) 35.0-35.9, adult: Secondary | ICD-10-CM

## 2020-10-13 NOTE — Progress Notes (Signed)
Chief Complaint:   OBESITY [name] is here to discuss her progress with her obesity treatment plan along with follow-up of her obesity related diagnoses. Jamie Bean is on following a lower carbohydrate, vegetable and lean protein rich diet plan and states she is following her eating plan approximately 0% of the time. Jamie Bean states she is doing Zumba and stationary bike 20-25 minutes 1 times per week.  Today's visit was #: 33 Starting weight: 218 lbs Starting date: 10/09/2017 Today's weight: 243 lbs Today's date: 10/11/2020 Total lbs lost to date: 0 Total lbs lost since last in-office visit: 0  Interim History: Jamie Bean is breastfeeding every 90-120 minutes during the daytime. She is also supplementing with formula for her infant daughter. She plans on breastfeeding until Jamie Bean is 10 months old- she is currently 2.5 months old.  Subjective:   1. Other iron deficiency anemia Jamie Bean is on oral ferrous sulfate 325 mg QHS. She denies constipation.  2. Vitamin D deficiency Jamie Bean's Vitamin D level was 30.7 on 04/06/2020. She is currently taking a prenatal vitamin. She denies nausea, vomiting or muscle weakness.  3. Hyperlipidemia, unspecified hyperlipidemia type 09/09/2020 lipid panel- total 226, LDL 154- both above goal with HDL 61.5. Jamie Bean is not on statin therapy.  Lab Results  Component Value Date   ALT 18 08/06/2020   AST 22 08/06/2020   ALKPHOS 62 08/06/2020   BILITOT 0.6 08/06/2020   Lab Results  Component Value Date   CHOL 226 (H) 09/09/2020   HDL 61.50 09/09/2020   LDLCALC 154 (H) 09/09/2020   TRIG 56.0 09/09/2020   CHOLHDL 4 09/09/2020    Assessment/Plan:   1. Other iron deficiency anemia Check fasting labs at next OV. Continue oral supplementation.  2. Vitamin D deficiency Low Vitamin D level contributes to fatigue and are associated with obesity, breast, and colon cancer. She agrees to continue to take prenatal vitamin and will follow-up for routine  testing of Vitamin D, at least 2-3 times per year to avoid over-replacement.  Check fasting labs at next OV.  3. Hyperlipidemia, unspecified hyperlipidemia type Cardiovascular risk and specific lipid/LDL goals reviewed.  We discussed several lifestyle modifications today and Jamie Bean will continue to work on diet, exercise and weight loss efforts. Orders and follow up as documented in patient record.  -Decrease saturated fat and increase exercise.  Counseling Intensive lifestyle modifications are the first line treatment for this issue. . Dietary changes: Increase soluble fiber. Decrease simple carbohydrates. . Exercise changes: Moderate to vigorous-intensity aerobic activity 150 minutes per week if tolerated. . Lipid-lowering medications: see documented in medical record.  4. Class 2 severe obesity with serious comorbidity and body mass index (BMI) of 35.0 to 35.9 in adult, unspecified obesity type (HCC)  Jamie Bean is currently in the action stage of change. As such, her goal is to continue with weight loss efforts. She has agreed to the Category 2 Plan.   Check IC and fasting labs. Will restart on category 2 plan. Adjust as necessary after IC/RMR. Pt is aware to arrive 30 minutes prior to OV for IC to be completed.  Exercise goals: As is  Behavioral modification strategies: increasing lean protein intake, decreasing simple carbohydrates, meal planning and cooking strategies, keeping healthy foods in the home and planning for success.  Jamie Bean has agreed to follow-up with our clinic in 3 weeks with Dr. Sharee Holster (fasting labs/IC). She was informed of the importance of frequent follow-up visits to maximize her success with intensive lifestyle modifications for her multiple  health conditions.   Objective:   Blood pressure 108/73, pulse 69, temperature 98.2 F (36.8 C), height 5\' 9"  (1.753 m), weight 243 lb (110.2 kg), last menstrual period 10/12/2019, SpO2 98 %, unknown if currently  breastfeeding. Body mass index is 35.88 kg/m.  General: Cooperative, alert, well developed, in no acute distress. HEENT: Conjunctivae and lids unremarkable. Cardiovascular: Regular rhythm.  Lungs: Normal work of breathing. Neurologic: No focal deficits.   Lab Results  Component Value Date   CREATININE 0.96 08/06/2020   BUN 11 08/06/2020   NA 136 08/06/2020   K 4.0 08/06/2020   CL 108 08/06/2020   CO2 21 (L) 08/06/2020   Lab Results  Component Value Date   ALT 18 08/06/2020   AST 22 08/06/2020   ALKPHOS 62 08/06/2020   BILITOT 0.6 08/06/2020   Lab Results  Component Value Date   HGBA1C 5.0 09/05/2019   HGBA1C 5.2 07/01/2018   HGBA1C 5.4 02/04/2018   HGBA1C 5.5 10/09/2017   HGBA1C 5.4 08/31/2017   Lab Results  Component Value Date   INSULIN 7.3 07/01/2018   INSULIN 6.9 02/04/2018   INSULIN 5.8 10/09/2017   Lab Results  Component Value Date   TSH 1.05 09/09/2020   Lab Results  Component Value Date   CHOL 226 (H) 09/09/2020   HDL 61.50 09/09/2020   LDLCALC 154 (H) 09/09/2020   TRIG 56.0 09/09/2020   CHOLHDL 4 09/09/2020   Lab Results  Component Value Date   WBC 6.8 08/06/2020   HGB 10.0 (L) 08/06/2020   HCT 29.8 (L) 08/06/2020   MCV 83.5 08/06/2020   PLT 157 08/06/2020   Lab Results  Component Value Date   IRON 133 09/09/2020   TIBC 285 09/09/2020   FERRITIN 55 09/09/2020     Attestation Statements:   Reviewed by clinician on day of visit: allergies, medications, problem list, medical history, surgical history, family history, social history, and previous encounter notes.  Time spent on visit including pre-visit chart review and post-visit care and charting was 32 minutes.   11/09/2020, CMA, am acting as transcriptionist for Edmund Hilda, NP.  I have reviewed the above documentation for accuracy and completeness, and I agree with the above. -  Pleshette Tomasini d. Braxston Quinter, NP-C

## 2020-11-01 ENCOUNTER — Encounter (INDEPENDENT_AMBULATORY_CARE_PROVIDER_SITE_OTHER): Payer: Self-pay | Admitting: Family Medicine

## 2020-11-01 ENCOUNTER — Other Ambulatory Visit: Payer: Self-pay

## 2020-11-01 ENCOUNTER — Ambulatory Visit (INDEPENDENT_AMBULATORY_CARE_PROVIDER_SITE_OTHER): Payer: 59 | Admitting: Family Medicine

## 2020-11-01 VITALS — BP 113/71 | HR 61 | Temp 98.3°F | Ht 69.0 in | Wt 244.0 lb

## 2020-11-01 DIAGNOSIS — Z6832 Body mass index (BMI) 32.0-32.9, adult: Secondary | ICD-10-CM

## 2020-11-01 DIAGNOSIS — E559 Vitamin D deficiency, unspecified: Secondary | ICD-10-CM | POA: Diagnosis not present

## 2020-11-01 DIAGNOSIS — E669 Obesity, unspecified: Secondary | ICD-10-CM | POA: Diagnosis not present

## 2020-11-01 DIAGNOSIS — R0602 Shortness of breath: Secondary | ICD-10-CM | POA: Diagnosis not present

## 2020-11-10 NOTE — Progress Notes (Signed)
Chief Complaint:   OBESITY Jamie Bean is here to discuss her progress with her obesity treatment plan along with follow-up of her obesity related diagnoses.   Today's visit was #: 34 Starting weight: 218 lbs Starting date: 10/09/2017 Today's weight: 244 lbs Today's date: 11/01/2020 Weight change since last visit: +1 lb Total lbs lost to date: +26 lbs Body mass index is 36.03 kg/m.   Interim History:  At [facility] last office visit, she gained 6 pounds.  She went back on the meal plan.  Does well with breakfast and lunch.  Drinking ~80 ounces of water per day.  We will redo her metabolic test today.  Plan:  Increased REE due to her currently breastfeeding.  We will change her to Category 3.  Current Meal Plan: the Category 2 Plan for 75% of the time.  Current Exercise Plan: Stationary bike for 30 minutes 1 time per week.  Assessment/Plan:   1. Vitamin D deficiency Not at goal.  She is taking OTC vitamin D.  Plan: Continue current OTC vitamin D supplementation.  Will check vitamin D level at next visit.  Lab Results  Component Value Date   VD25OH 30.7 04/06/2020   VD25OH 29 (L) 09/05/2019   VD25OH 58.8 07/01/2018   2. Shortness of breath on exertion IC was performed today with a new REE of 2275.  3. Class 1 obesity with serious comorbidity and body mass index (BMI) of 32.0 to 32.9 in adult, unspecified obesity type  Course: Yanina is currently in the action stage of change. As such, her goal is to continue with weight loss efforts.   Nutrition goals: She has agreed to the Category 3 Plan.   Exercise goals:  As is.  Behavioral modification strategies: increasing lean protein intake, decreasing simple carbohydrates, avoiding temptations, and planning for success.  Tonisha has agreed to follow-up with our clinic in 3 weeks. She was informed of the importance of frequent follow-up visits to maximize her success with intensive lifestyle modifications for her multiple  health conditions.   Objective:   Blood pressure 113/71, pulse 61, temperature 98.3 F (36.8 C), height 5\' 9"  (1.753 m), weight 244 lb (110.7 kg), SpO2 97 %, unknown if currently breastfeeding. Body mass index is 36.03 kg/m.  General: Cooperative, alert, well developed, in no acute distress. HEENT: Conjunctivae and lids unremarkable. Cardiovascular: Regular rhythm.  Lungs: Normal work of breathing. Neurologic: No focal deficits.   Lab Results  Component Value Date   CREATININE 0.96 08/06/2020   BUN 11 08/06/2020   NA 136 08/06/2020   K 4.0 08/06/2020   CL 108 08/06/2020   CO2 21 (L) 08/06/2020   Lab Results  Component Value Date   ALT 18 08/06/2020   AST 22 08/06/2020   ALKPHOS 62 08/06/2020   BILITOT 0.6 08/06/2020   Lab Results  Component Value Date   HGBA1C 5.0 09/05/2019   HGBA1C 5.2 07/01/2018   HGBA1C 5.4 02/04/2018   HGBA1C 5.5 10/09/2017   HGBA1C 5.4 08/31/2017   Lab Results  Component Value Date   INSULIN 7.3 07/01/2018   INSULIN 6.9 02/04/2018   INSULIN 5.8 10/09/2017   Lab Results  Component Value Date   TSH 1.05 09/09/2020   Lab Results  Component Value Date   CHOL 226 (H) 09/09/2020   HDL 61.50 09/09/2020   LDLCALC 154 (H) 09/09/2020   TRIG 56.0 09/09/2020   CHOLHDL 4 09/09/2020   Lab Results  Component Value Date   VD25OH 30.7 04/06/2020  VD25OH 29 (L) 09/05/2019   VD25OH 58.8 07/01/2018   Lab Results  Component Value Date   WBC 6.8 08/06/2020   HGB 10.0 (L) 08/06/2020   HCT 29.8 (L) 08/06/2020   MCV 83.5 08/06/2020   PLT 157 08/06/2020   Lab Results  Component Value Date   IRON 133 09/09/2020   TIBC 285 09/09/2020   FERRITIN 55 09/09/2020   Attestation Statements:   Reviewed by clinician on day of visit: allergies, medications, problem list, medical history, surgical history, family history, social history, and previous encounter notes.  Time spent on visit including pre-visit chart review and post-visit care and  charting was 15 minutes.   I, Insurance claims handler, CMA, am acting as Energy manager for [employer], DO.  I have reviewed the above documentation for accuracy and completeness, and I agree with the above. Carlye Grippe, D.O.  The 21st Century Cures Act was signed into law in 2016 which includes the topic of electronic health records.  This provides immediate access to information in MyChart.  This includes consultation notes, operative notes, office notes, lab results and pathology reports.  If you have any questions about what you read please let us know at your next visit so we can discuss your concerns and take corrective action if need be.  We are right here with you.

## 2020-11-22 ENCOUNTER — Ambulatory Visit (INDEPENDENT_AMBULATORY_CARE_PROVIDER_SITE_OTHER): Payer: 59 | Admitting: Family Medicine

## 2021-01-21 LAB — OB RESULTS CONSOLE RPR: RPR: NONREACTIVE

## 2021-01-21 LAB — OB RESULTS CONSOLE GC/CHLAMYDIA
Chlamydia: NEGATIVE
Gonorrhea: NEGATIVE

## 2021-01-21 LAB — OB RESULTS CONSOLE HIV ANTIBODY (ROUTINE TESTING): HIV: NONREACTIVE

## 2021-01-21 LAB — OB RESULTS CONSOLE HEPATITIS B SURFACE ANTIGEN: Hepatitis B Surface Ag: NEGATIVE

## 2021-01-21 LAB — OB RESULTS CONSOLE ANTIBODY SCREEN: Antibody Screen: NEGATIVE

## 2021-01-21 LAB — HEPATITIS C ANTIBODY: HCV Ab: NEGATIVE

## 2021-01-21 LAB — OB RESULTS CONSOLE ABO/RH: RH Type: POSITIVE

## 2021-01-21 LAB — OB RESULTS CONSOLE RUBELLA ANTIBODY, IGM: Rubella: IMMUNE

## 2021-02-24 ENCOUNTER — Inpatient Hospital Stay (HOSPITAL_COMMUNITY)
Admission: AD | Admit: 2021-02-24 | Discharge: 2021-02-24 | Disposition: A | Discharge status: Home / Self Care | Payer: 59 | Attending: Obstetrics & Gynecology | Admitting: Obstetrics & Gynecology

## 2021-02-24 ENCOUNTER — Other Ambulatory Visit: Payer: Self-pay

## 2021-02-24 ENCOUNTER — Inpatient Hospital Stay (HOSPITAL_COMMUNITY): Payer: 59

## 2021-02-24 ENCOUNTER — Encounter (HOSPITAL_COMMUNITY): Payer: Self-pay | Admitting: Obstetrics & Gynecology

## 2021-02-24 DIAGNOSIS — Z3A12 12 weeks gestation of pregnancy: Secondary | ICD-10-CM

## 2021-02-24 DIAGNOSIS — O4691 Antepartum hemorrhage, unspecified, first trimester: Secondary | ICD-10-CM

## 2021-02-24 DIAGNOSIS — O209 Hemorrhage in early pregnancy, unspecified: Secondary | ICD-10-CM | POA: Insufficient documentation

## 2021-02-24 DIAGNOSIS — Z882 Allergy status to sulfonamides status: Secondary | ICD-10-CM | POA: Diagnosis not present

## 2021-02-24 LAB — POCT PREGNANCY, URINE: Preg Test, Ur: POSITIVE — AB

## 2021-02-24 NOTE — MAU Provider Note (Signed)
Chief Complaint: Back Pain and Vaginal Bleeding   Event Date/Time   First Provider Initiated Contact with Patient 02/24/21 2105        SUBJECTIVE HPI: Jamie Bean is a 35 y.o. G2P1001 at Unknown by LMP who presents to maternity admissions reporting vaginal bleeding tonight.  Has some low back pain also. Had a previous pregnancy with large subchorionic hemorrhage, so is very worried.  She denies urinary symptoms, h/a, dizziness, n/v, or fever/chills.    Back Pain This is a new problem. The current episode started today. The problem is unchanged. The pain is present in the lumbar spine. The pain does not radiate. Pertinent negatives include no abdominal pain, dysuria, fever, headaches or paresis. She has tried nothing for the symptoms.  Vaginal Bleeding The patient's primary symptoms include vaginal bleeding. The patient's pertinent negatives include no genital itching, genital lesions or genital odor. This is a new problem. The current episode started today. She is pregnant. Associated symptoms include back pain. Pertinent negatives include no abdominal pain, dysuria, fever or headaches. The vaginal discharge was bloody. The vaginal bleeding is lighter than menses. She has not been passing clots. She has not been passing tissue. Nothing aggravates the symptoms. She has tried nothing for the symptoms.   RN note: Jamie Bean is a 35 y.o. here in MAU reporting: She is [redacted] weeks pregnant, has had a bedside US in office. This evening she began having vaginal bleeding when she wipes and lower back pain. Is currently breastfeeding.  LMP: unsure  Pain score: 1/10  Past Medical History:  Diagnosis Date   Anemia    Anxiety    Arthritis    Constipation    Female infertility    Gestational HTN    Joint pain    Kidney problem    Knee pain    Palpitations    UTI (urinary tract infection)    Vitamin D deficiency    Past Surgical History:  Procedure Laterality Date   INCISE AND DRAIN ABCESS      1994   Social History   Socioeconomic History   Marital status: Married    Spouse name: Doug   Number of children: Not on file   Years of education: Not on file   Highest education level: Not on file  Occupational History   Occupation: Warden/ranger    Comment: [employer]  Tobacco Use   Smoking status: Never   Smokeless tobacco: Never  Vaping Use   Vaping Use: Never used  Substance and Sexual Activity   Alcohol use: Not Currently    Comment: occasional   Drug use: No   Sexual activity: Yes    Partners: Male  Other Topics Concern   Not on file  Social History Narrative   Lives in [place]      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   Current Outpatient Medications on File Prior to Encounter  Medication Sig Dispense Refill   CHERRY PO Take by mouth. Tart  red cherry     Cholecalciferol (VITAMIN D3 PO) Take by mouth.     ferrous sulfate 325 (65 FE) MG tablet Take 325 mg by mouth at bedtime.     ibuprofen (ADVIL) 600 MG tablet Take 1 tablet (600 mg total) by mouth every 6 (six) hours. 30 tablet 0   magnesium oxide (MAG-OX) 400 MG tablet Take 400 mg by mouth at bedtime.     Moringa Oleifera (MORINGA PO) Take by mouth.     Omega-3 Fatty Acids (FISH OIL PO) Take by mouth.     Prenatal Vit-Fe Fumarate-FA (PRENATAL MULTIVITAMIN) TABS tablet Take 1 tablet by mouth at bedtime.     Probiotic Product (PROBIOTIC PO) Take 1 capsule by mouth at bedtime. Probiotic prenatal due to recent pregnancy     Pyridoxine HCl (VITAMIN B-6 PO) Take by mouth.     TURMERIC PO  Take by mouth.     Allergies  Allergen Reactions   Sulfa Antibiotics Hives    I have reviewed patient's Past Medical Hx, Surgical Hx, Family Hx, Social Hx, medications and allergies.   ROS:  Review of Systems  Constitutional:  Negative for fever.  Gastrointestinal:  Negative for abdominal pain.  Genitourinary:  Positive for vaginal bleeding. Negative for dysuria.  Musculoskeletal:  Positive for back pain.  Neurological:  Negative for headaches.  Review of Systems  Other systems negative   Physical Exam  Physical Exam Patient Vitals for the past 24 hrs:  BP Temp Temp src Pulse Resp SpO2 Height Weight  02/24/21 2040 (!) 144/78 99.1 F (37.3 C) Oral 83 17 99 % 5\' 9"  (1.753 m) 111.3 kg   Constitutional: Well-developed, well-nourished female in no acute distress.  Cardiovascular: normal rate Respiratory: normal effort GI: Abd soft, non-tender. Pos BS x 4 MS: Extremities nontender, no edema, normal ROM Neurologic: Alert and oriented x 4.  GU: Neg CVAT.  PELVIC EXAM: Cervix pink, visually closed, without lesion, scant red discharge, vaginal walls and external genitalia normal  LAB RESULTS Results for orders placed or performed during the hospital encounter of 02/24/21 (from the past 24 hour(s))  Pregnancy, urine POC     Status: Abnormal   Collection Time: 02/24/21  8:51 PM  Result Value Ref Range   Preg Test, Ur POSITIVE (A) NEGATIVE    --/--/B POS (03/28 0127)  IMAGING 02-06-1992 OB Comp Less 14 Wks  Result Date: 02/24/2021 CLINICAL DATA:  Pregnant, lower back pain, vaginal bleeding EXAM: OBSTETRIC <14 WK ULTRASOUND TECHNIQUE: Transabdominal ultrasound was performed for evaluation of the gestation as well as the maternal uterus and adnexal regions. COMPARISON:  02/01/2020 FINDINGS: Intrauterine gestational sac: Single Yolk sac:  Not Visualized. Embryo:  Visualized. Cardiac Activity: Visualized. Heart Rate: 157 bpm CRL:   59.6 mm   12 w 3 d                  02/03/2020 EDC: 09/05/2021  Subchorionic hemorrhage:  None visualized. Maternal uterus/adnexae: Small uterine fibroids, measuring up to 2.4 cm. Bilateral ovaries are within normal limits. No free fluid. IMPRESSION: Single intrauterine gestation with cardiac activity, measuring 12 weeks 3 days by crown-rump length, as above. Electronically Signed   By: 11/05/2021 M.D.   On: 02/24/2021 22:16     MAU Management/MDM: Ordered Ultrasound to rule out SAB or [facility]. There was a viable fetus with no evidence of subchorionic hemorrhage noted  This bleeding/pain can represent a normal pregnancy with bleeding, spontaneous abortion or even an ectopic which can be  life-threatening.  The process as listed above helps to determine which of these is present.  Reviewed reassuring findings of [place]. Recommend pelvic rest and followup in office  ASSESSMENT [place] IUP at [redacted]w[redacted]d Vaginal bleeding   PLAN Discharge home Pelvic rest Bleeding precautions Followup in office Pt stable at time of discharge. Encouraged to return here if she develops worsening of symptoms, increase in pain, fever, or other concerning symptoms.    Wynelle Bourgeois CNM, MSN Certified Nurse-Midwife 02/24/2021  9:05 PM

## 2021-02-24 NOTE — MAU Note (Signed)
..  Jamie Bean is a 35 y.o. at Unknown here in MAU reporting: She is [redacted] weeks pregnant, has had a bedside US in office. This evening she began having vaginal bleeding when she wipes and lower back pain. Is currently breastfeeding.  LMP: unsure  Pain score: 1/10 Vitals:   02/24/21 2040  BP: (!) 144/78  Pulse: 83  Resp: 17  Temp: 99.1 F (37.3 C)  SpO2: 99%

## 2021-03-16 ENCOUNTER — Other Ambulatory Visit: Payer: Self-pay

## 2021-03-16 ENCOUNTER — Ambulatory Visit (INDEPENDENT_AMBULATORY_CARE_PROVIDER_SITE_OTHER): Payer: 59 | Admitting: Family

## 2021-03-16 ENCOUNTER — Encounter: Payer: Self-pay | Admitting: Family

## 2021-03-16 VITALS — BP 102/70 | HR 74 | Temp 98.7°F | Ht 69.0 in | Wt 245.4 lb

## 2021-03-16 DIAGNOSIS — Z23 Encounter for immunization: Secondary | ICD-10-CM

## 2021-03-16 DIAGNOSIS — N6341 Unspecified lump in right breast, subareolar: Secondary | ICD-10-CM | POA: Diagnosis not present

## 2021-03-16 DIAGNOSIS — N6310 Unspecified lump in the right breast, unspecified quadrant: Secondary | ICD-10-CM | POA: Insufficient documentation

## 2021-03-16 NOTE — Patient Instructions (Signed)
Referral placed to general surgery for ultrasound, further evaluation Let us know if you dont hear back within a week in regards to an appointment being scheduled.   Congratulations!

## 2021-03-16 NOTE — Progress Notes (Signed)
Subjective:    Patient ID: Jamie Bean, female    DOB: [birth year], 35 y.o.   MRN: [id number]  CC: Jamie Bean is a 35 y.o. female who presents today for follow up.   HPI: [redacted] weeks pregnant. Feels well. She has 77 month old daughter at home.  Continues to nurse.  For the past couple weeks she has noticed on her right breast a firmness or mass. No breast pain, redness, engorgement Baby latching and feeding well.  Breast size has increased in size since this pregnancy.  Breast milk is decreased since second pregnancy  Following with [facility]  No family history of breast cancer   HISTORY:  Past Medical History:  Diagnosis Date   Anemia    Anxiety    Arthritis    Constipation    Female infertility    Gestational HTN    Joint pain    Knee pain    Palpitations    UTI (urinary tract infection)    Vitamin D deficiency    Past Surgical History:  Procedure Laterality Date   INCISE AND DRAIN ABCESS     1994   Family History  Problem Relation Age of Onset   Renal cancer Maternal Grandmother 67   Alcohol abuse Maternal Grandmother    Hyperlipidemia Maternal Grandmother    Hypertension Maternal Grandmother    Arthritis Father    Obesity Father    Alcohol abuse Maternal Uncle    Mental illness Maternal Uncle    Mental illness Paternal Aunt    Diabetes Paternal Aunt    Hyperlipidemia Maternal Grandfather    Hypertension Maternal Grandfather    Stroke Paternal Grandmother    Diabetes Paternal Grandmother    Cancer Paternal Grandfather        lung   Diabetes Brother        dm2 dx'ed 76    Colon cancer Neg Hx    Breast cancer Neg Hx     Allergies: Sulfa antibiotics Current Outpatient Medications on File Prior to Visit  Medication Sig Dispense Refill   aspirin 81 MG chewable tablet Chew by mouth daily.     Cholecalciferol (VITAMIN D3 PO) Take by mouth.     ferrous sulfate 325 (65 FE) MG tablet Take 325 mg by mouth at bedtime.     Omega-3 Fatty Acids  (FISH OIL PO) Take by mouth.     Prenatal Vit-Fe Fumarate-FA (PRENATAL MULTIVITAMIN) TABS tablet Take 1 tablet by mouth at bedtime.     No current facility-administered medications on file prior to visit.    Social History   Tobacco Use   Smoking status: Never   Smokeless tobacco: Never  Vaping Use   Vaping Use: Never used  Substance Use Topics   Alcohol use: Not Currently    Comment: occasional   Drug use: No    Review of Systems  Constitutional:  Negative for chills and fever.  Respiratory:  Negative for cough.   Cardiovascular:  Negative for chest pain and palpitations.  Gastrointestinal:  Negative for nausea and vomiting.  Skin:  Negative for rash.     Objective:    BP 102/70 (BP Location: Left Arm, Patient Position: Sitting, Cuff Size: Large)   Pulse 74   Temp 98.7 F (37.1 C) (Oral)   Ht 5\' 9"  (1.753 m)   Wt 245 lb 6.4 oz (111.3 kg)   LMP  (LMP Unknown)   SpO2 96%   BMI 36.24 kg/m  BP Readings from  Last 3 Encounters:  03/16/21 102/70  02/24/21 135/80  11/01/20 113/71   Wt Readings from Last 3 Encounters:  03/16/21 245 lb 6.4 oz (111.3 kg)  02/24/21 245 lb 6.4 oz (111.3 kg)  11/01/20 244 lb (110.7 kg)    Physical Exam Vitals reviewed.  Constitutional:      Appearance: She is well-developed.  Eyes:     Conjunctiva/sclera: Conjunctivae normal.  Cardiovascular:     Rate and Rhythm: Normal rate and regular rhythm.     Pulses: Normal pulses.     Heart sounds: Normal heart sounds.  Pulmonary:     Effort: Pulmonary effort is normal.     Breath sounds: Normal breath sounds. No wheezing, rhonchi or rales.  Chest:  Breasts:    Right: Mass present. No swelling, bleeding, inverted nipple, nipple discharge, skin change or tenderness.     Left: No swelling, bleeding, inverted nipple, mass, nipple discharge, skin change or tenderness.     Comments: Right breast 8 oclock firm area, circumscribed proximal to nipple.  No increased warmth, erythema, varicosities,  breast pain.  Skin:    General: Skin is warm and dry.  Neurological:     Mental Status: She is alert.  Psychiatric:        Speech: Speech normal.        Behavior: Behavior normal.        Thought Content: Thought content normal.       Assessment & Plan:   Problem List Items Addressed This Visit       Other   Breast mass, right - Primary    Exam consistent with a firm, well-circumscribed mass proximal to the right nipple.  Considering clogged milk duct, weight changes, pregnancy at 15 weeks.  Advise further evaluation and ultrasound general surgery to exclude malignancy.  Referral placed to Dr. Lemar Livings.  Will follow      Relevant Orders   Ambulatory referral to General Surgery   Other Visit Diagnoses     Need for immunization against influenza       Relevant Orders   Flu Vaccine QUAD 60mo+IM (Fluarix, Fluzone & Alfiuria Quad PF) (Completed)        I have discontinued Ninnie Scali's Probiotic Product (PROBIOTIC PO), ibuprofen, TURMERIC PO, CHERRY PO, Pyridoxine HCl (VITAMIN B-6 PO), and Moringa Oleifera (MORINGA PO). I am also having her maintain her ferrous sulfate, prenatal multivitamin, Cholecalciferol (VITAMIN D3 PO), Omega-3 Fatty Acids (FISH OIL PO), and aspirin.   No orders of the defined types were placed in this encounter.   Return precautions given.   Risks, benefits, and alternatives of the medications and treatment plan prescribed today were discussed, and patient expressed understanding.   Education regarding symptom management and diagnosis given to patient on AVS.  Continue to follow with Allegra Grana, FNP for routine health maintenance.   Suzi Roots and I agreed with plan.   Rennie Plowman, FNP

## 2021-03-16 NOTE — Assessment & Plan Note (Signed)
Exam consistent with a firm, well-circumscribed mass proximal to the right nipple.  Considering clogged milk duct, weight changes, pregnancy at 15 weeks.  Advise further evaluation and ultrasound general surgery to exclude malignancy.  Referral placed to Dr. Lemar Livings.  Will follow

## 2021-05-08 NOTE — L&D Delivery Note (Signed)
Delivery Note ?At 12:44 AM a viable female was delivered via Vaginal, Spontaneous (Presentation: Middle Occiput Anterior).  APGAR: 9, 9; weight  pending.   ?Placenta status: Spontaneous, complete   .  Cord: 3 vessels with the following complications: None.  Cord pH: NA ? ?Anesthesia: Epidural ?Episiotomy: None ?Lacerations: 2nd degree;Perineal ?Suture Repair: 3.0 vicryl ?Est. Blood Loss (mL): 250 ? ?Mom to postpartum.  Baby to Couplet care / Skin to Skin. ? ?Christophe Louis ?09/10/2021, 1:18 AM ? ? ? ?

## 2021-06-28 ENCOUNTER — Encounter (INDEPENDENT_AMBULATORY_CARE_PROVIDER_SITE_OTHER): Payer: Self-pay

## 2021-07-13 ENCOUNTER — Ambulatory Visit (INDEPENDENT_AMBULATORY_CARE_PROVIDER_SITE_OTHER): Payer: 59 | Admitting: Family Medicine

## 2021-07-27 ENCOUNTER — Ambulatory Visit (INDEPENDENT_AMBULATORY_CARE_PROVIDER_SITE_OTHER): Payer: 59 | Admitting: Family Medicine

## 2021-08-29 ENCOUNTER — Other Ambulatory Visit: Payer: Self-pay | Admitting: Obstetrics and Gynecology

## 2021-08-29 LAB — OB RESULTS CONSOLE GBS: GBS: POSITIVE

## 2021-09-02 ENCOUNTER — Telehealth (HOSPITAL_COMMUNITY): Payer: Self-pay | Admitting: *Deleted

## 2021-09-02 NOTE — Telephone Encounter (Signed)
Preadmission screen  

## 2021-09-06 ENCOUNTER — Encounter (HOSPITAL_COMMUNITY): Payer: Self-pay

## 2021-09-07 ENCOUNTER — Telehealth (HOSPITAL_COMMUNITY): Payer: Self-pay | Admitting: *Deleted

## 2021-09-07 NOTE — Telephone Encounter (Signed)
Preadmission screen  

## 2021-09-08 ENCOUNTER — Telehealth (HOSPITAL_COMMUNITY): Payer: Self-pay | Admitting: *Deleted

## 2021-09-08 NOTE — Telephone Encounter (Signed)
Preadmission screen  

## 2021-09-09 ENCOUNTER — Inpatient Hospital Stay (HOSPITAL_COMMUNITY)
Admission: AD | Admit: 2021-09-09 | Discharge: 2021-09-11 | DRG: 807 | Disposition: A | Discharge status: Home / Self Care | Payer: 59 | Attending: Obstetrics and Gynecology | Admitting: Obstetrics and Gynecology

## 2021-09-09 ENCOUNTER — Inpatient Hospital Stay (HOSPITAL_COMMUNITY): Payer: 59 | Admitting: Anesthesiology

## 2021-09-09 ENCOUNTER — Other Ambulatory Visit: Payer: Self-pay

## 2021-09-09 ENCOUNTER — Encounter (HOSPITAL_COMMUNITY): Payer: Self-pay | Admitting: Obstetrics and Gynecology

## 2021-09-09 ENCOUNTER — Telehealth (HOSPITAL_COMMUNITY): Payer: Self-pay | Admitting: *Deleted

## 2021-09-09 DIAGNOSIS — O99824 Streptococcus B carrier state complicating childbirth: Secondary | ICD-10-CM | POA: Diagnosis present

## 2021-09-09 DIAGNOSIS — Z3A39 39 weeks gestation of pregnancy: Secondary | ICD-10-CM | POA: Diagnosis not present

## 2021-09-09 DIAGNOSIS — O134 Gestational [pregnancy-induced] hypertension without significant proteinuria, complicating childbirth: Principal | ICD-10-CM | POA: Diagnosis present

## 2021-09-09 DIAGNOSIS — O26893 Other specified pregnancy related conditions, third trimester: Secondary | ICD-10-CM | POA: Diagnosis present

## 2021-09-09 DIAGNOSIS — Z6838 Body mass index (BMI) 38.0-38.9, adult: Principal | ICD-10-CM

## 2021-09-09 LAB — TYPE AND SCREEN
ABO/RH(D): B POS
Antibody Screen: NEGATIVE

## 2021-09-09 LAB — POCT FERN TEST: POCT Fern Test: POSITIVE

## 2021-09-09 LAB — CBC
HCT: 37 % (ref 36.0–46.0)
Hemoglobin: 12.5 g/dL (ref 12.0–15.0)
MCH: 28.3 pg (ref 26.0–34.0)
MCHC: 33.8 g/dL (ref 30.0–36.0)
MCV: 83.7 fL (ref 80.0–100.0)
Platelets: 195 10*3/uL (ref 150–400)
RBC: 4.42 MIL/uL (ref 3.87–5.11)
RDW: 13.3 % (ref 11.5–15.5)
WBC: 11.8 10*3/uL — ABNORMAL HIGH (ref 4.0–10.5)
nRBC: 0 % (ref 0.0–0.2)

## 2021-09-09 MED ORDER — PHENYLEPHRINE 80 MCG/ML (10ML) SYRINGE FOR IV PUSH (FOR BLOOD PRESSURE SUPPORT): 80.0000 ug | PREFILLED_SYRINGE | INTRAVENOUS | Status: DC | PRN

## 2021-09-09 MED ORDER — ACETAMINOPHEN 325 MG PO TABS: 650.0000 mg | ORAL_TABLET | ORAL | Status: DC | PRN

## 2021-09-09 MED ORDER — OXYTOCIN-SODIUM CHLORIDE 30-0.9 UT/500ML-% IV SOLN
1.0000 m[IU]/min | INTRAVENOUS | Status: DC
  Administered 2021-09-09: 2 m[IU]/min via INTRAVENOUS
  Filled 2021-09-09: qty 500

## 2021-09-09 MED ORDER — SOD CITRATE-CITRIC ACID 500-334 MG/5ML PO SOLN: 30.0000 mL | ORAL | Status: DC | PRN

## 2021-09-09 MED ORDER — LACTATED RINGERS IV BOLUS
1000.0000 mL | Freq: Once | INTRAVENOUS | Status: AC
  Administered 2021-09-09: 1000 mL via INTRAVENOUS

## 2021-09-09 MED ORDER — LIDOCAINE HCL (PF) 1 % IJ SOLN
INTRAMUSCULAR | Status: DC | PRN
  Administered 2021-09-09 (×2): 5 mL via EPIDURAL

## 2021-09-09 MED ORDER — TERBUTALINE SULFATE 1 MG/ML IJ SOLN: 0.2500 mg | Freq: Once | INTRAMUSCULAR | Status: DC | PRN

## 2021-09-09 MED ORDER — LACTATED RINGERS IV SOLN: INTRAVENOUS | Status: DC

## 2021-09-09 MED ORDER — OXYTOCIN-SODIUM CHLORIDE 30-0.9 UT/500ML-% IV SOLN: 2.5000 [IU]/h | INTRAVENOUS | Status: DC

## 2021-09-09 MED ORDER — OXYTOCIN BOLUS FROM INFUSION
333.0000 mL | Freq: Once | INTRAVENOUS | Status: AC
  Administered 2021-09-10: 333 mL via INTRAVENOUS

## 2021-09-09 MED ORDER — DIPHENHYDRAMINE HCL 50 MG/ML IJ SOLN: 12.5000 mg | INTRAMUSCULAR | Status: DC | PRN

## 2021-09-09 MED ORDER — SODIUM CHLORIDE 0.9 % IV SOLN: 5.0000 10*6.[IU] | Freq: Once | INTRAVENOUS | Status: DC

## 2021-09-09 MED ORDER — LACTATED RINGERS IV SOLN: 500.0000 mL | Freq: Once | INTRAVENOUS | Status: DC

## 2021-09-09 MED ORDER — SODIUM CHLORIDE 0.9 % IV SOLN
1.0000 g | INTRAVENOUS | Status: DC
  Administered 2021-09-09 (×2): 1 g via INTRAVENOUS
  Filled 2021-09-09 (×2): qty 1000

## 2021-09-09 MED ORDER — SODIUM CHLORIDE 0.9 % IV SOLN
2.0000 g | Freq: Once | INTRAVENOUS | Status: AC
  Administered 2021-09-09: 2 g via INTRAVENOUS
  Filled 2021-09-09: qty 2000

## 2021-09-09 MED ORDER — LIDOCAINE HCL (PF) 1 % IJ SOLN: 30.0000 mL | INTRAMUSCULAR | Status: DC | PRN

## 2021-09-09 MED ORDER — EPHEDRINE 5 MG/ML INJ: 10.0000 mg | INTRAVENOUS | Status: DC | PRN

## 2021-09-09 MED ORDER — PENICILLIN G POT IN DEXTROSE 60000 UNIT/ML IV SOLN: 3.0000 10*6.[IU] | INTRAVENOUS | Status: DC

## 2021-09-09 MED ORDER — FENTANYL-BUPIVACAINE-NACL 0.5-0.125-0.9 MG/250ML-% EP SOLN
12.0000 mL/h | EPIDURAL | Status: DC | PRN
  Administered 2021-09-09: 12 mL/h via EPIDURAL
  Filled 2021-09-09: qty 250

## 2021-09-09 MED ORDER — DIPHENHYDRAMINE HCL 50 MG/ML IJ SOLN
25.0000 mg | Freq: Once | INTRAMUSCULAR | Status: AC
  Administered 2021-09-09: 25 mg via INTRAVENOUS
  Filled 2021-09-09: qty 1

## 2021-09-09 MED ORDER — ONDANSETRON HCL 4 MG/2ML IJ SOLN: 4.0000 mg | Freq: Four times a day (QID) | INTRAMUSCULAR | Status: DC | PRN

## 2021-09-09 MED ORDER — LACTATED RINGERS IV SOLN: 500.0000 mL | INTRAVENOUS | Status: DC | PRN

## 2021-09-09 NOTE — MAU Note (Addendum)
Jamie Bean is a 36 y.o. at [redacted]w[redacted]d here in MAU reporting: CTX. Pt states ROM at 0430 with yellow fluid. No VB. +FM ?LMP:  ?Onset of complaint: 09/09/2021 ?Pain score: 7/10 contractions ?Vitals:  ? 09/09/21 1331  ?BP: 133/81  ?Pulse: 86  ?Resp: 18  ?Temp: 98.8 ?F (37.1 ?C)  ?   ?FHT:130 ?Lab orders placed from triage:  ?

## 2021-09-09 NOTE — H&P (Signed)
Jamie Bean is a 36 y.o. female presenting for labor.  She was found to be intact and 6 cm.  GBS pos.  [name] ?OB History   ? ? Gravida  ?2  ? Para  ?1  ? Term  ?1  ? Preterm  ?0  ? AB  ?0  ? Living  ?1  ?  ? ? SAB  ?0  ? IAB  ?0  ? Ectopic  ?0  ? Multiple  ?0  ? Live Births  ?1  ?   ?  ?  ? ?Past Medical History:  ?Diagnosis Date  ? Anemia   ? Anxiety   ? Arthritis   ? Constipation   ? Female infertility   ? Gestational HTN   ? Joint pain   ? Knee pain   ? Palpitations   ? UTI (urinary tract infection)   ? Vitamin D deficiency   ? ?Past Surgical History:  ?Procedure Laterality Date  ? INCISE AND DRAIN ABCESS    ? 1994  ? ?Family History: family history includes Alcohol abuse in her maternal grandmother and maternal uncle; Arthritis in her father; Cancer in her paternal grandfather; Diabetes in her brother, paternal aunt, and paternal grandmother; Hyperlipidemia in her maternal grandfather and maternal grandmother; Hypertension in her maternal grandfather and maternal grandmother; Mental illness in her maternal uncle and paternal aunt; Obesity in her father; Renal cancer (age of onset: 37) in her maternal grandmother; Stroke in her paternal grandmother. ?Social History:  reports that she has never smoked. She has never used smokeless tobacco. She reports that she does not currently use alcohol. She reports that she does not use drugs. ? ? ?  ?Maternal Diabetes: No ?Genetic Screening: Normal ?Maternal Ultrasounds/Referrals: Normal ?Fetal Ultrasounds or other Referrals:  None ?Maternal Substance Abuse:  No ?Significant Maternal Medications:  None ?Significant Maternal Lab Results:  Group B Strep positive ?Other Comments:  None ? ?Review of Systems ?History ?Dilation: 7 ?Effacement (%): 80 ?Station: -2 ?Exam by:: k fields, rn ?Blood pressure (!) 144/63, pulse 90, temperature 98.1 ?F (36.7 ?C), temperature source Axillary, resp. rate 18, height 5\' 9"  (1.753 m), weight 121.1 kg, SpO2 99 %, unknown if currently  breastfeeding. ?Exam ?Physical Exam  ?Physical Examination: General appearance - alert, well appearing, and in no distress ?Chest - clear to auscultation, no wheezes, rales or rhonchi, symmetric air entry ?Heart - normal rate and regular rhythm ?Abdomen - soft, nontender, nondistended, no masses or organomegaly ?gravid ?Extremities - peripheral pulses normal, no pedal edema, no clubbing or cyanosis, Homan's sign negative bilaterally  ?Prenatal labs: ?ABO, Rh: --/--/B POS (05/05 1329) ?Antibody: NEG (05/05 1329) ?Rubella: Immune (09/16 0000) ?RPR: Nonreactive (09/16 0000)  ?HBsAg: Negative (09/16 0000)  ?HIV: Non-reactive (09/16 0000)  ?GBS: Positive/-- (04/24 0000)  ? ?Assessment/Plan: ?Term active labor ?LGA EFW 8-6 on 4/18 ?Epidural prn ?GBS pos  ?Anticipate SVD  ? ?Jamie Bean A Jamie Bean ?09/09/2021, 3:30 PM ? ? ? ? ?

## 2021-09-09 NOTE — Progress Notes (Signed)
?  Subjective: ?In to assess patient due to reports of anterior lip and category 2 tracing. Pt is comfortable with her epidural . She reports feeling pressure and fetal movement. Pitocin was discontinued ? ?Objective: ?BP 117/71   Pulse 87   Temp 97.6 ?F (36.4 ?C) (Oral)   Resp 16   Ht 5\' 9"  (1.753 m)   Wt 121.1 kg   LMP  (LMP Unknown)   SpO2 99%   BMI 39.43 kg/m?  ?I/O last 3 completed shifts: ?In: -  ?Out: 500 [Urine:500] ?No intake/output data recorded. ? ?FHT:  FHR: 140 bpm, variability: moderate,  accelerations:  Present,  decelerations:  Present variable and prolonged occasional late  ?UC:   irregular, every 5-6 minutes ?SVE:   Dilation: Lip/rim ?Effacement (%):  (swollen) ?Station: Plus 1 ?Exam by:: Dr Landry Mellow.. Attempts at reducing the anterior lip were unsuccessful.  ? ?Labs: ?Lab Results  ?Component Value Date  ? WBC 11.8 (H) 09/09/2021  ? HGB 12.5 09/09/2021  ? HCT 37.0 09/09/2021  ? MCV 83.7 09/09/2021  ? PLT 195 09/09/2021  ? ? ?Assessment / Plan: ?Augmentation of labor, progressing well ? ?Labor:  pitocin discontinued due to late decelerations. Fetal heart rate improved with positon changes. 25 mg of benadryl given for  swollen anterior lip  ?Preeclampsia:   NA ?Fetal Wellbeing:  Category II ?Pain Control:  Epidural ?I/D:   Ampicillin  ?Anticipated MOD:  NSVD ? ?Jamie Bean ?09/09/2021, 11:57 PM ? ? ?

## 2021-09-09 NOTE — Anesthesia Procedure Notes (Signed)
Epidural ?Patient location during procedure: OB ?Start time: 09/09/2021 2:44 PM ?End time: 09/09/2021 2:53 PM ? ?Staffing ?Anesthesiologist: Mal Amabile, MD ?Performed: anesthesiologist  ? ?Preanesthetic Checklist ?Completed: patient identified, IV checked, site marked, risks and benefits discussed, surgical consent, monitors and equipment checked, pre-op evaluation and timeout performed ? ?Epidural ?Patient position: sitting ?Prep: DuraPrep and site prepped and draped ?Patient monitoring: continuous pulse ox and blood pressure ?Approach: midline ?Location: L4-L5 ?Injection technique: LOR air ? ?Needle:  ?Needle type: Tuohy  ?Needle gauge: 17 G ?Needle length: 9 cm and 9 ?Needle insertion depth: 7 cm ?Catheter type: closed end flexible ?Catheter size: 19 Gauge ?Catheter at skin depth: 12 cm ?Test dose: negative and Other ? ?Assessment ?Events: blood not aspirated, injection not painful, no injection resistance, no paresthesia and negative IV test ? ?Additional Notes ?Patient identified. Risks and benefits discussed including failed block, incomplete  ?Pain control, post dural puncture headache, nerve damage, paralysis, blood pressure ?Changes, nausea, vomiting, reactions to medications-both toxic and allergic and post ?Partum back pain. All questions were answered. Patient expressed understanding and wished to proceed. Sterile technique was used throughout procedure. Epidural site was ?Dressed with sterile barrier dressing. No paresthesias, signs of intravascular injection ?Or signs of intrathecal spread were encountered.  ?Patient was more comfortable after the epidural was dosed. ?Please see RN's note for documentation of vital signs and FHR which are stable. ?Reason for block:procedure for pain ? ? ? ?

## 2021-09-09 NOTE — Progress Notes (Signed)
? ?  Subjective: ?Jamie Bean is comfortable with her epidural. Her contractions have spaced out. She denies feeling any pressure.  ? ?Objective: ?BP 126/79   Pulse 79   Temp (!) 97.5 ?F (36.4 ?C) (Oral)   Resp 16   Ht 5\' 9"  (1.753 m)   Wt 121.1 kg   LMP  (LMP Unknown)   SpO2 99%   BMI 39.43 kg/m?  ?I/O last 3 completed shifts: ?In: -  ?Out: 500 [Urine:500] ?No intake/output data recorded. ? ?FHT:  FHR: 120 bpm, variability: moderate,  accelerations:  Present,  decelerations:  Absent ?UC:   Irregular every 1-7 minutes  ?SVE:   7/80/-1 forebag ruptured clear fluid.  ?Lab Results  ?Component Value Date  ? WBC 11.8 (H) 09/09/2021  ? HGB 12.5 09/09/2021  ? HCT 37.0 09/09/2021  ? MCV 83.7 09/09/2021  ? PLT 195 09/09/2021  ? ? ?Assessment / Plan: ?Active labor contractions irregular. Forebag  broken ? ?Labor:  recheck cervix in 1 hour if cervical change recommend starting pitocin 2x2 ?Preeclampsia:   NA ?Fetal Wellbeing:  Category I ?Pain Control:  Epidural ?I/D:   ampicillin  ?Anticipated MOD:  NSVD ? ?11/09/2021 ?09/09/2021, 9:25 PM ? ? ?

## 2021-09-09 NOTE — Anesthesia Preprocedure Evaluation (Signed)
Anesthesia Evaluation  ?Patient identified by MRN, date of birth, ID band ?Patient awake ? ? ? ?Reviewed: ?Allergy & Precautions, NPO status , Patient's Chart, lab work & pertinent test results ? ?Airway ?Mallampati: III ? ?TM Distance: >3 FB ?Neck ROM: Full ? ? ? Dental ? ?(+) Teeth Intact, Dental Advisory Given ?  ?Pulmonary ?neg pulmonary ROS,  ?  ?Pulmonary exam normal ?breath sounds clear to auscultation ? ? ? ? ? ? Cardiovascular ?hypertension, negative cardio ROS ?Normal cardiovascular exam ?Rhythm:Regular Rate:Normal ? ? ?  ?Neuro/Psych ?PSYCHIATRIC DISORDERS Anxiety negative neurological ROS ?   ? GI/Hepatic ?negative GI ROS, Neg liver ROS,   ?Endo/Other  ?Obesity ? ? Renal/GU ?negative Renal ROS  ?negative genitourinary ?  ?Musculoskeletal ? ?(+) Arthritis , Osteoarthritis,   ? Abdominal ?  ?Peds ? Hematology ? ?(+) Blood dyscrasia, anemia , Plt 191k   ?Anesthesia Other Findings ? ? Reproductive/Obstetrics ? ?  ? ? ? ? ? ? ? ? ? ? ? ? ? ?  ?  ? ? ? ? ? ? ? ? ?Anesthesia Physical ? ?Anesthesia Plan ? ?ASA: 2 ? ?Anesthesia Plan: Epidural  ? ?Post-op Pain Management:   ? ?Induction:  ? ?PONV Risk Score and Plan: 2 and Treatment may vary due to age or medical condition ? ?Airway Management Planned: Natural Airway ? ?Additional Equipment:  ? ?Intra-op Plan:  ? ?Post-operative Plan:  ? ?Informed Consent: I have reviewed the patients History and Physical, chart, labs and discussed the procedure including the risks, benefits and alternatives for the proposed anesthesia with the patient or authorized representative who has indicated his/her understanding and acceptance.  ? ? ? ?Dental advisory given ? ?Plan Discussed with: Anesthesiologist ? ?Anesthesia Plan Comments:   ? ? ? ? ? ? ?Anesthesia Quick Evaluation ? ?

## 2021-09-09 NOTE — Telephone Encounter (Signed)
Preadmission screen  

## 2021-09-10 ENCOUNTER — Encounter (HOSPITAL_COMMUNITY): Payer: Self-pay | Admitting: Obstetrics and Gynecology

## 2021-09-10 LAB — COMPREHENSIVE METABOLIC PANEL
ALT: 15 U/L (ref 0–44)
AST: 21 U/L (ref 15–41)
Albumin: 2.5 g/dL — ABNORMAL LOW (ref 3.5–5.0)
Alkaline Phosphatase: 85 U/L (ref 38–126)
Anion gap: 7 (ref 5–15)
BUN: 8 mg/dL (ref 6–20)
CO2: 21 mmol/L — ABNORMAL LOW (ref 22–32)
Calcium: 8.8 mg/dL — ABNORMAL LOW (ref 8.9–10.3)
Chloride: 108 mmol/L (ref 98–111)
Creatinine, Ser: 0.9 mg/dL (ref 0.44–1.00)
GFR, Estimated: >60 mL/min (ref >60)
Glucose, Bld: 101 mg/dL — ABNORMAL HIGH (ref 70–99)
Potassium: 3.6 mmol/L (ref 3.5–5.1)
Sodium: 136 mmol/L (ref 135–145)
Total Bilirubin: 1 mg/dL (ref 0.3–1.2)
Total Protein: 5.6 g/dL — ABNORMAL LOW (ref 6.5–8.1)

## 2021-09-10 LAB — CBC
HCT: 32.1 % — ABNORMAL LOW (ref 36.0–46.0)
Hemoglobin: 11.2 g/dL — ABNORMAL LOW (ref 12.0–15.0)
MCH: 28.7 pg (ref 26.0–34.0)
MCHC: 34.9 g/dL (ref 30.0–36.0)
MCV: 82.3 fL (ref 80.0–100.0)
Platelets: 181 10*3/uL (ref 150–400)
RBC: 3.9 MIL/uL (ref 3.87–5.11)
RDW: 13.3 % (ref 11.5–15.5)
WBC: 13.5 10*3/uL — ABNORMAL HIGH (ref 4.0–10.5)
nRBC: 0 % (ref 0.0–0.2)

## 2021-09-10 LAB — RPR: RPR Ser Ql: NONREACTIVE

## 2021-09-10 MED ORDER — NIFEDIPINE ER OSMOTIC RELEASE 30 MG PO TB24
30.0000 mg | ORAL_TABLET | Freq: Every day | ORAL | Status: DC
  Administered 2021-09-10 – 2021-09-11 (×2): 30 mg via ORAL
  Filled 2021-09-10 (×2): qty 1

## 2021-09-10 MED ORDER — SIMETHICONE 80 MG PO CHEW
80.0000 mg | CHEWABLE_TABLET | ORAL | Status: DC | PRN
  Administered 2021-09-10: 80 mg via ORAL
  Filled 2021-09-10: qty 1

## 2021-09-10 MED ORDER — DIPHENHYDRAMINE HCL 25 MG PO CAPS: 25.0000 mg | ORAL_CAPSULE | Freq: Four times a day (QID) | ORAL | Status: DC | PRN

## 2021-09-10 MED ORDER — IBUPROFEN 600 MG PO TABS: 600.0000 mg | ORAL_TABLET | Freq: Four times a day (QID) | ORAL | 0 refills | Status: DC | PRN

## 2021-09-10 MED ORDER — ZOLPIDEM TARTRATE 5 MG PO TABS: 5.0000 mg | ORAL_TABLET | Freq: Every evening | ORAL | Status: DC | PRN

## 2021-09-10 MED ORDER — PRENATAL MULTIVITAMIN CH
1.0000 | ORAL_TABLET | Freq: Every day | ORAL | Status: DC
  Administered 2021-09-10 – 2021-09-11 (×2): 1 via ORAL
  Filled 2021-09-10 (×2): qty 1

## 2021-09-10 MED ORDER — BENZOCAINE-MENTHOL 20-0.5 % EX AERO
1.0000 "application " | INHALATION_SPRAY | CUTANEOUS | Status: DC | PRN
  Filled 2021-09-10: qty 56

## 2021-09-10 MED ORDER — WITCH HAZEL-GLYCERIN EX PADS: 1.0000 "application " | MEDICATED_PAD | CUTANEOUS | Status: DC | PRN

## 2021-09-10 MED ORDER — NIFEDIPINE ER 30 MG PO TB24: 30.0000 mg | ORAL_TABLET | Freq: Every day | ORAL | 1 refills | Status: DC

## 2021-09-10 MED ORDER — ACETAMINOPHEN 325 MG PO TABS
650.0000 mg | ORAL_TABLET | ORAL | Status: DC | PRN
  Administered 2021-09-10 (×2): 650 mg via ORAL
  Filled 2021-09-10 (×2): qty 2

## 2021-09-10 MED ORDER — ACETAMINOPHEN 325 MG PO TABS: 650.0000 mg | ORAL_TABLET | ORAL | Status: DC | PRN

## 2021-09-10 MED ORDER — METHYLERGONOVINE MALEATE 0.2 MG/ML IJ SOLN: 0.2000 mg | INTRAMUSCULAR | Status: DC | PRN

## 2021-09-10 MED ORDER — FERROUS SULFATE 325 (65 FE) MG PO TABS
325.0000 mg | ORAL_TABLET | Freq: Two times a day (BID) | ORAL | Status: DC
  Administered 2021-09-10 – 2021-09-11 (×3): 325 mg via ORAL
  Filled 2021-09-10 (×3): qty 1

## 2021-09-10 MED ORDER — COCONUT OIL OIL: 1.0000 "application " | TOPICAL_OIL | Status: DC | PRN

## 2021-09-10 MED ORDER — METHYLERGONOVINE MALEATE 0.2 MG PO TABS: 0.2000 mg | ORAL_TABLET | ORAL | Status: DC | PRN

## 2021-09-10 MED ORDER — IBUPROFEN 600 MG PO TABS
600.0000 mg | ORAL_TABLET | Freq: Four times a day (QID) | ORAL | Status: DC
  Administered 2021-09-10 – 2021-09-11 (×6): 600 mg via ORAL
  Filled 2021-09-10 (×6): qty 1

## 2021-09-10 MED ORDER — SENNOSIDES-DOCUSATE SODIUM 8.6-50 MG PO TABS
2.0000 | ORAL_TABLET | Freq: Every day | ORAL | Status: DC
  Filled 2021-09-10: qty 2

## 2021-09-10 MED ORDER — DIBUCAINE (PERIANAL) 1 % EX OINT: 1.0000 "application " | TOPICAL_OINTMENT | CUTANEOUS | Status: DC | PRN

## 2021-09-10 MED ORDER — ONDANSETRON HCL 4 MG PO TABS: 4.0000 mg | ORAL_TABLET | ORAL | Status: DC | PRN

## 2021-09-10 MED ORDER — ONDANSETRON HCL 4 MG/2ML IJ SOLN: 4.0000 mg | INTRAMUSCULAR | Status: DC | PRN

## 2021-09-10 NOTE — Lactation Note (Signed)
This note was copied from a baby's chart. ?Lactation Consultation Note ?Experienced BF mom had baby on the breast when LC came into room. ?Baby pops off and on but feeding well when she is on the breast. ?Mom BF her now 74 month old for 9 months. ?Mom has no questions at this time. Mom is hungry and eating. ?Mom will be f/u on MBU. ? ?Patient Name: Jamie Bean ?Today's Date: 09/10/2021 ?Reason for consult: L&D Initial assessment;Term ?Age:38 hours ? ?Maternal Data ?Does the patient have breastfeeding experience prior to this delivery?: Yes ?How long did the patient breastfeed?: 9 months ? ?Feeding ?  ? ?LATCH Score ?Latch: Grasps breast easily, tongue down, lips flanged, rhythmical sucking. ? ?Audible Swallowing: None ? ?Type of Nipple: Everted at rest and after stimulation ? ?Comfort (Breast/Nipple): Soft / non-tender ? ?Hold (Positioning): No assistance needed to correctly position infant at breast. ? ?LATCH Score: 8 ? ? ?Lactation Tools Discussed/Used ?  ? ?Interventions ?Interventions: Support pillows ? ?Discharge ?  ? ?Consult Status ?Consult Status: Follow-up from L&D ?Date: 09/10/21 ?Follow-up type: In-patient ? ? ? ?[name] ?09/10/2021, 1:21 AM ? ? ? ?

## 2021-09-10 NOTE — Progress Notes (Signed)
Post Partum Day 0  ?Subjective: ?no complaints, up ad lib, voiding, tolerating PO, and + flatus she denies headache visual disturbances or RUQ pain  ? ?Objective: ?Blood pressure (!) 156/88, pulse 92, temperature 98.2 ?F (36.8 ?C), temperature source Oral, resp. rate 18, height 5\' 9"  (1.753 m), weight 121.1 kg, SpO2 98 %, unknown if currently breastfeeding. ? ?Physical Exam:  ?General: alert, cooperative, and no distress ?Lochia: appropriate ?Uterine Fundus: firm ?Incision: NA ?DVT Evaluation: No evidence of DVT seen on physical exam. ? ?Recent Labs  ?  09/09/21 ?1331 09/10/21 ?0500  ?HGB 12.5 11.2*  ?HCT 37.0 32.1*  ? ? ?Assessment/Plan: ?Breastfeeding and Lactation consult ?Gestational hypertension - cmp ordered.  Pt started on procardix xl 30 mg daily  ?Routine postpartum care  ? ? LOS: 1 day  ? ?Jamie Bean ?09/10/2021, 12:10 PM  ? ? ?

## 2021-09-10 NOTE — Lactation Note (Signed)
This note was copied from a baby's chart. ?Lactation Consultation Note ? ?Patient Name: Jamie Bean ?Today's Date: 09/10/2021 ?Reason for consult: Initial assessment;Term ?Age:38 hours ? ?LC in to visit with P2 Mom of term baby.  Mom had baby on the breast in cradle hold.  Baby's latch looked deep and Mom denied pain.  Baby came off the breast and some pinching noted on nipple.  Offered to assist with a more comfortable latch. ? ?Pillow added over Mom's breastfeeding pillow to elevate baby to height of breast.  Adjusted Mom's breast support to a U hold from a C hold.  Mom assisted to support firmly, baby's head from ear to ear.   ? ?Reviewed hand expression, colostrum easily flowing.  Assisted Mom to stimulate upper lip by nipple and baby opened her mouth wide and latched.  LC pulled gently down on chin for a wider gape and baby started sucking with deeper jaw extensions.  FOB shown how to do the chin tug.  Mom responded with a smile and how latch was more comfortable. ? ?Basic teaching reviewed.   ? ?Mom provided with information on importance of STS and frequent feedings.  Mom's first baby is 69 months old and she is interested in tandem nursing.  Mom to offer the breast to her newborn first each time.   ? ?Encouraged Mom to call prn for concerns. ? ?Maternal Data ?Has patient been taught Hand Expression?: Yes ?Does the patient have breastfeeding experience prior to this delivery?: Yes ?How long did the patient breastfeed?: 9 months (Mom may start tandem nursing her 49 month old) ? ?Feeding ?Mother's Current Feeding Choice: Breast Milk ? ?LATCH Score ?Latch: Grasps breast easily, tongue down, lips flanged, rhythmical sucking. ? ?Audible Swallowing: Spontaneous and intermittent ? ?Type of Nipple: Everted at rest and after stimulation ? ?Comfort (Breast/Nipple): Soft / non-tender ? ?Hold (Positioning): Assistance needed to correctly position infant at breast and maintain latch. ? ?LATCH Score: 9 ? ? ?Lactation  Tools Discussed/Used ?  ? ?Interventions ?Interventions: Breast feeding basics reviewed;Assisted with latch;Skin to skin;Breast massage;Hand express;Breast compression;Adjust position;Support pillows;Position options;LC Services brochure ? ?Discharge ?  ? ?Consult Status ?Consult Status: Follow-up ?Date: 09/11/21 ?Follow-up type: In-patient ? ? ? ?Johny Blamer E ?09/10/2021, 2:17 PM ? ? ? ?

## 2021-09-10 NOTE — Progress Notes (Signed)
MOB was referred for history of anxiety. ?* Referral screened out by Clinical Social Worker because none of the following criteria appear to apply: ?~ History of anxiety/depression during this pregnancy, or of post-partum depression following prior delivery. No concerns of anxiety noted in prenatal records. ?~ Diagnosis of anxiety and/or depression within last 3 years. Per chart review, MOB's anxiety dates back to 2019.  ?OR ?* MOB's symptoms currently being treated with medication and/or therapy. ? ?Please contact the Clinical Social Worker if needs arise, by MOB request, or if MOB scores greater than 9/yes to question 10 on Edinburgh Postpartum Depression Screen. ? ?Rodman Recupero, LCSW ?Clinical Social Worker ?[facility] ?Cell#: [phone] ? ?

## 2021-09-10 NOTE — Anesthesia Postprocedure Evaluation (Signed)
Anesthesia Post Note ? ?Patient: Jamie Bean ? ?Procedure(s) Performed: AN AD HOC LABOR EPIDURAL ? ?  ? ?Patient location during evaluation: Mother Baby ?Anesthesia Type: Epidural ?Level of consciousness: awake ?Pain management: satisfactory to patient ?Vital Signs Assessment: post-procedure vital signs reviewed and stable ?Respiratory status: spontaneous breathing ?Cardiovascular status: stable ?Anesthetic complications: no ? ? ?No notable events documented. ? ?Last Vitals:  ?Vitals:  ? 09/10/21 0844 09/10/21 1110  ?BP: (!) 147/74 (!) 156/88  ?Pulse:  92  ?Resp:  18  ?Temp:  36.8 ?C  ?SpO2:  98%  ?  ?Last Pain:  ?Vitals:  ? 09/10/21 1110  ?TempSrc: Oral  ?PainSc: 2   ? ?Pain Goal:   ? ?  ?  ?  ?  ?  ?  ?  ? ?[name] ? ? ? ? ?

## 2021-09-11 NOTE — Lactation Note (Signed)
This note was copied from a baby's chart. ?Lactation Consultation Note ? ?Patient Name: Jamie Bean ?Today's Date: 09/11/2021 ?Reason for consult: Follow-up assessment ?Age:38 hours ? ? ?P2 mother whose infant is now 47 hours old.  This is a term infant at 39+3 weeks.  Mother's current feeding preference is breast. ? ?Mother had no questions/concerns related to breast feeding.  She reported that baby sometimes does not open wide for latching; tips to assist with latching discussed.  Mother is also practicing the chin tug.  Nipples intact. ? ?Encouraged to continue feeding 8-12 times/24 hours or sooner if baby shows cues.  Mother is planning to tandem feed her 19 month old also; reminded mother to allow the newborn to feed first.  Mother verbalized understanding. ? ?Family ready for discharge; awaiting the pediatrician's order.  Father present. ? ? ?Maternal Data ?  ? ?Feeding ?  ? ?LATCH Score ?  ? ?  ? ?  ? ?  ? ?  ? ?  ? ? ?Lactation Tools Discussed/Used ?  ? ?Interventions ?  ? ?Discharge ?Discharge Education: Engorgement and breast care ?Pump: Personal ? ?Consult Status ?Consult Status: Complete ?Date: 09/11/21 ?Follow-up type: Call as needed ? ? ? ?Shantal Roan R Leahmarie Gasiorowski ?09/11/2021, 1:20 PM ? ? ? ?

## 2021-09-11 NOTE — Discharge Summary (Signed)
? ?  Postpartum Discharge Summary ? ?Date of Service updated 09/11/2021 ? ?   ?Patient Name: Jamie Bean ?DOB: Dec 28, 1985 ?MRN: [id number] ? ?Date of admission: 09/09/2021 ?Delivery date:09/10/2021  ?Delivering provider: Christophe Louis  ?Date of discharge: 09/11/2021 ? ?Admitting diagnosis: Body mass index (BMI) 38.0-38.9, adult [Z68.38] ?Intrauterine pregnancy: [redacted]w[redacted]d    ?Secondary diagnosis:  Principal Problem: ?  Body mass index (BMI) 38.0-38.9, adult ? ?Additional problems: None    ?Discharge diagnosis: Term pregnancy delivered and gestational hypertension                                            ?Post partum procedures: None ?Augmentation: Pitocin ?Complications: None ? ?Hospital course: Onset of Labor With Vaginal Delivery      ?36y.o. yo G2P2002 at 318w3das admitted in Active Labor on 09/09/2021. Patient had an uncomplicated labor course as follows:  ?Membrane Rupture Time/Date: 4:30 AM ,09/09/2021   ?Delivery Method:Vaginal, Spontaneous  ?Episiotomy: None  ?Lacerations:  2nd degree;Perineal  ?Patient had an uncomplicated postpartum course.  She is ambulating, tolerating a regular diet, passing flatus, and urinating well. Patient is discharged home in stable condition on 09/11/21. ? ?Newborn Data: ?Birth date:09/10/2021  ?Birth time:12:44 AM  ?Gender:Female  ?Living status:Living  ?Apgars:9 ,9  ?Weight:4131 g  ? ?Magnesium Sulfate received: No ?BMZ received: No ?Rhophylac:N/A ?MMR:N/A ?T-DaP: Unknown ?Flu: N/A ?Transfusion:No ? ?Physical exam  ?Vitals:  ? 09/10/21 1110 09/10/21 1520 09/10/21 2130 09/11/21 0635  ?BP: (!) 156/88 138/66 127/71 116/67  ?Pulse: 92 87 85 69  ?Resp: '18 18 20 18  ' ?Temp: 98.2 ?F (36.8 ?C) 98.3 ?F (36.8 ?C) 97.8 ?F (36.6 ?C) 97.8 ?F (36.6 ?C)  ?TempSrc: Oral Oral Oral Oral  ?SpO2: 98% 100% 98% 99%  ?Weight:      ?Height:      ? ?General: alert, cooperative, and no distress ?Lochia: appropriate ?Uterine Fundus: firm ?Incision: NA ?DVT Evaluation: No evidence of DVT seen on physical  exam. ?Labs: ?Lab Results  ?Component Value Date  ? WBC 13.5 (H) 09/10/2021  ? HGB 11.2 (L) 09/10/2021  ? HCT 32.1 (L) 09/10/2021  ? MCV 82.3 09/10/2021  ? PLT 181 09/10/2021  ? ? ?  Latest Ref Rng & Units 09/10/2021  ? 11:11 AM  ?CMP  ?Glucose 70 - 99 mg/dL 101    ?BUN 6 - 20 mg/dL 8    ?Creatinine 0.44 - 1.00 mg/dL 0.90    ?Sodium 135 - 145 mmol/L 136    ?Potassium 3.5 - 5.1 mmol/L 3.6    ?Chloride 98 - 111 mmol/L 108    ?CO2 22 - 32 mmol/L 21    ?Calcium 8.9 - 10.3 mg/dL 8.8    ?Total Protein 6.5 - 8.1 g/dL 5.6    ?Total Bilirubin 0.3 - 1.2 mg/dL 1.0    ?Alkaline Phos 38 - 126 U/L 85    ?AST 15 - 41 U/L 21    ?ALT 0 - 44 U/L 15    ? ?Edinburgh Score: ? ?  08/06/2020  ?  5:45 PM  ?EdFlavia Shipperostnatal Depression Scale Screening Tool  ?I have been able to laugh and see the funny side of things. 0  ?I have looked forward with enjoyment to things. 0  ?I have blamed myself unnecessarily when things went wrong. 1  ?I have been anxious or worried for no good reason. 1  ?  I have felt scared or panicky for no good reason. 1  ?Things have been getting on top of me. 1  ?I have been so unhappy that I have had difficulty sleeping. 0  ?I have felt sad or miserable. 0  ?I have been so unhappy that I have been crying. 0  ?The thought of harming myself has occurred to me. 0  ?Edinburgh Postnatal Depression Scale Total 4  ? ? ? ? ?After visit meds:  ?Allergies as of 09/11/2021   ? ?   Reactions  ? Sulfa Antibiotics Hives  ? ?  ? ?  ?Medication List  ?  ? ?STOP taking these medications   ? ?aspirin 81 MG chewable tablet ?  ? ?  ? ?TAKE these medications   ? ?acetaminophen 325 MG tablet ?Commonly known as: Tylenol ?Take 2 tablets (650 mg total) by mouth every 4 (four) hours as needed (for pain scale < 4). ?  ?ferrous sulfate 325 (65 FE) MG tablet ?Take 325 mg by mouth at bedtime. ?  ?FISH OIL PO ?Take by mouth. ?  ?ibuprofen 600 MG tablet ?Commonly known as: ADVIL ?Take 1 tablet (600 mg total) by mouth every 6 (six) hours as needed. ?   ?NIFEdipine 30 MG 24 hr tablet ?Commonly known as: ADALAT CC ?Take 1 tablet (30 mg total) by mouth daily. ?  ?prenatal multivitamin Tabs tablet ?Take 1 tablet by mouth at bedtime. ?  ?VITAMIN D3 PO ?Take by mouth. ?  ? ?  ? ? ? ?Discharge home in stable condition ?Infant Feeding: Breast ?Infant Disposition:home with mother ?Discharge instruction: per After Visit Summary and Postpartum booklet. ?Activity: Advance as tolerated. Pelvic rest for 6 weeks.  ?Diet: routine diet ?Anticipated Birth Control: Unsure ?Postpartum Appointment:1 week ?Additional Postpartum F/U: BP check 1 week ?Future Appointments:No future appointments. ?Follow up Visit: ? Follow-up Information   ? ? Crawford Givens, MD. Schedule an appointment as soon as possible for a visit in 1 week(s).   ?Specialty: Obstetrics and Gynecology ?Why: please call Dr. Vito Berger to schedule a blood pressure check in 1 week and a postpartum visit in 6 weeks ?Contact information: ?[address] ?[address] ?[place] [address] ?[phone] ? ? ?  ?  ? ?  ?  ? ?  ? ? ? ?  ? ?09/11/2021 ?Christophe Louis, MD ? ? ?

## 2021-09-14 ENCOUNTER — Inpatient Hospital Stay (HOSPITAL_COMMUNITY): Admission: AD | Admit: 2021-09-14 | Payer: 59 | Source: Home / Self Care | Admitting: Obstetrics and Gynecology

## 2021-09-14 ENCOUNTER — Inpatient Hospital Stay (HOSPITAL_COMMUNITY): Payer: 59

## 2021-09-20 ENCOUNTER — Telehealth (HOSPITAL_COMMUNITY): Payer: Self-pay | Admitting: *Deleted

## 2021-09-20 NOTE — Telephone Encounter (Signed)
Left phone voicemail message. ? ?Duffy Rhody, RN 09-20-2021 at 10:03am ?

## 2021-12-14 ENCOUNTER — Encounter (INDEPENDENT_AMBULATORY_CARE_PROVIDER_SITE_OTHER): Payer: Self-pay

## 2022-04-30 IMAGING — DX DG KNEE STANDING AP BILAT
1 series · 1 of 1 positions shown · non-contrast
Comparison: None.

CLINICAL DATA: Bilateral knee pain

EXAM:
BILATERAL KNEES STANDING - 1 VIEW

[knee ap]
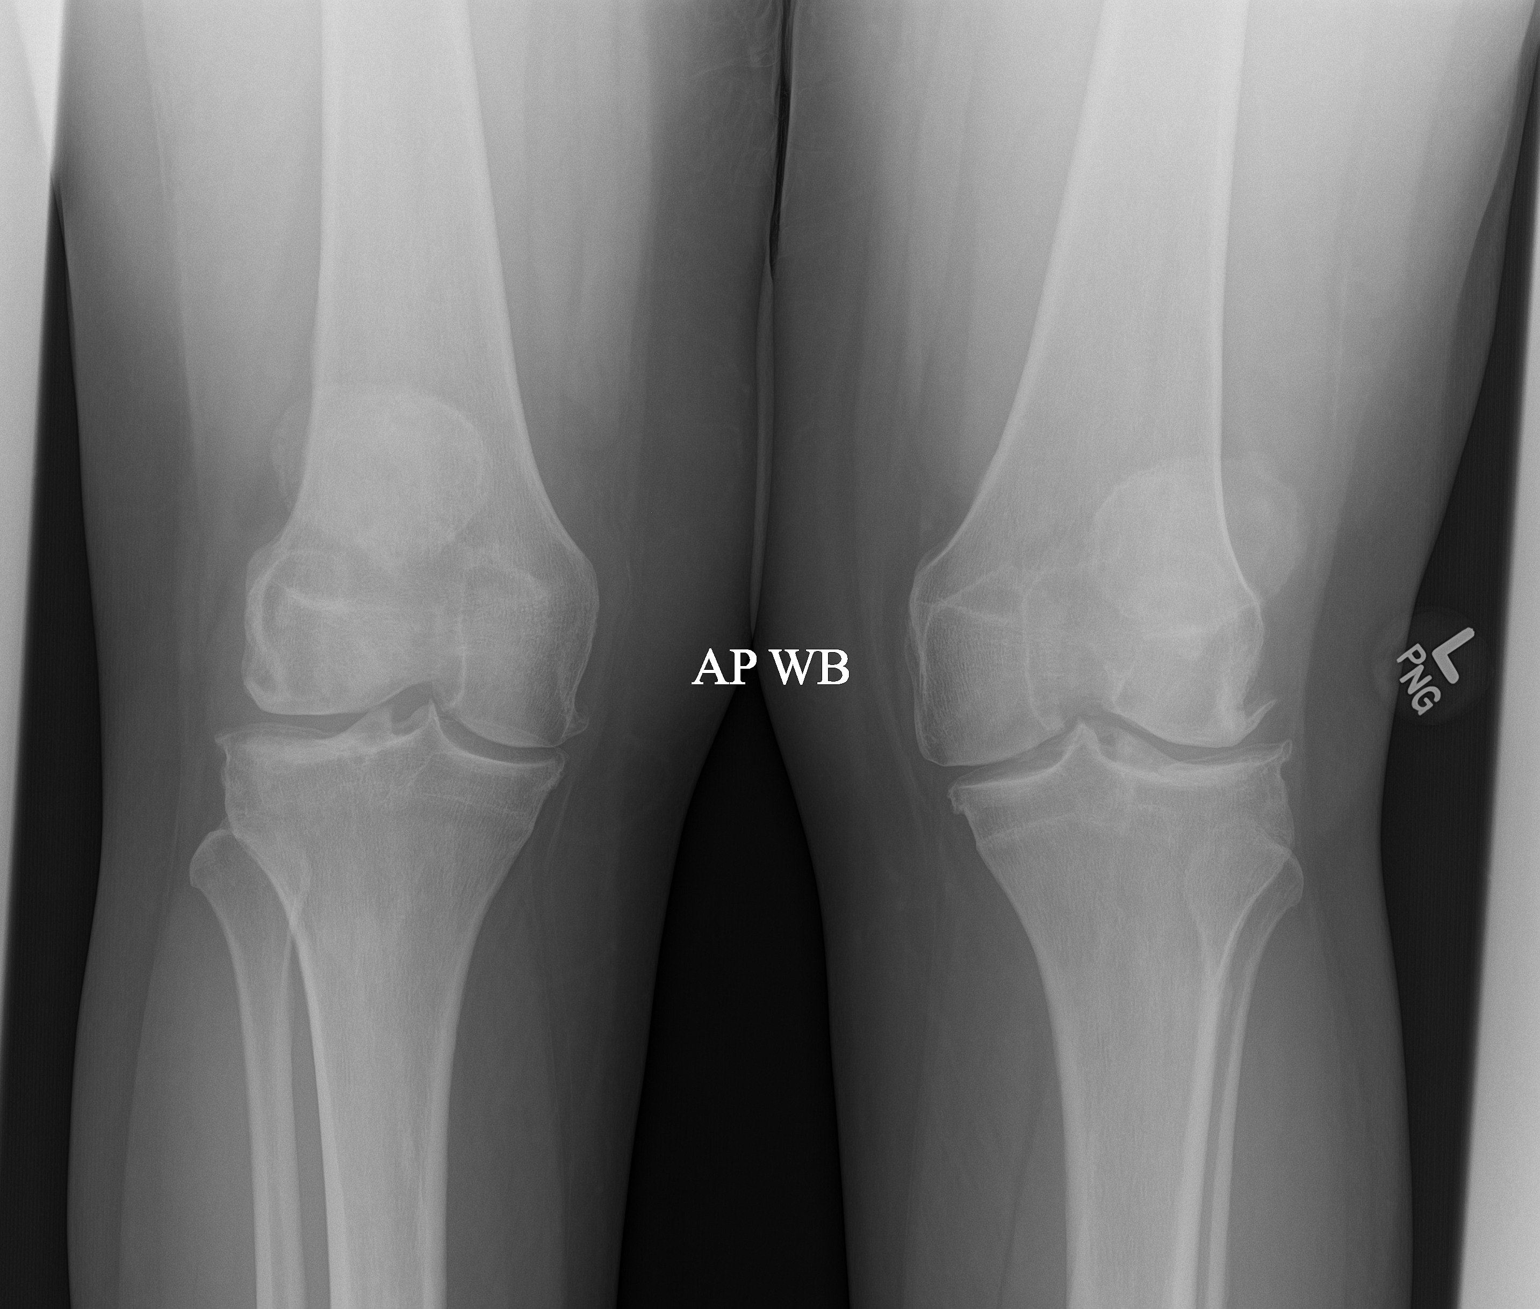

[1 of 1 positions shown; findings below may reference images not displayed]

FINDINGS: Standing frontal view of each knee obtained. There is narrowing in
each medial compartment. There is spurring medially and laterally on
both sides. No fracture or dislocation.

There is mild lateral patellar subluxation on the left.
IMPRESSION: Osteoarthritic change bilaterally, essentially symmetric. Mild
lateral patellar subluxation on the left. No fracture or
dislocation. No erosions evident on single view.

## 2022-07-26 ENCOUNTER — Ambulatory Visit (INDEPENDENT_AMBULATORY_CARE_PROVIDER_SITE_OTHER): Payer: 59 | Admitting: Family

## 2022-07-26 ENCOUNTER — Telehealth: Payer: Self-pay | Admitting: Family

## 2022-07-26 ENCOUNTER — Encounter: Payer: Self-pay | Admitting: Family

## 2022-07-26 VITALS — BP 108/76 | HR 78 | Temp 98.4°F | Ht 69.0 in | Wt 249.0 lb

## 2022-07-26 DIAGNOSIS — Z136 Encounter for screening for cardiovascular disorders: Secondary | ICD-10-CM

## 2022-07-26 DIAGNOSIS — Z13220 Encounter for screening for lipoid disorders: Secondary | ICD-10-CM

## 2022-07-26 DIAGNOSIS — Z6838 Body mass index (BMI) 38.0-38.9, adult: Secondary | ICD-10-CM

## 2022-07-26 DIAGNOSIS — Z1231 Encounter for screening mammogram for malignant neoplasm of breast: Secondary | ICD-10-CM

## 2022-07-26 DIAGNOSIS — Z0000 Encounter for general adult medical examination without abnormal findings: Secondary | ICD-10-CM

## 2022-07-26 NOTE — Progress Notes (Signed)
Assessment & Plan:  Annual physical exam Assessment & Plan: Clinical breast exam performed.  Deferred pelvic exam in the absence of complaints and she is also following with GYN.  We discussed baseline mammogram in her 74s which patient would like to have done.  She is currently lactating.   We have a call out to [facility] in regards to advice.  I recommend  consideration for screening mammogram with bilateral ultrasound.  She has previously seen Dr. Bary Castilla when lactating for a mass in which she had ultrasound  done  Orders: -     TSH -     CBC with Differential/Platelet -     Comprehensive metabolic panel -     Hemoglobin A1c -     Lipid panel -     VITAMIN D 25 Hydroxy (Vit-D Deficiency, Fractures) -     IBC + Ferritin  Encounter for lipid screening for cardiovascular disease -     Lipid panel  Body mass index (BMI) 38.0-38.9, adult -     TSH -     Hemoglobin A1c -     Lipid panel     Return precautions given.   Risks, benefits, and alternatives of the medications and treatment plan prescribed today were discussed, and patient expressed understanding.   Education regarding symptom management and diagnosis given to patient on AVS either electronically or printed.  No follow-ups on file.  Jamie Paris, FNP  Subjective:    Patient ID: Jamie Bean, female    DOB: Jul 23, 1985, 37 y.o.   MRN: [id number]  CC: Jamie Bean is a 36 y.o. female who presents today for physical exam.    HPI: Patient feels well today.  She has 3 children.  She is working from home.    Colorectal Cancer Screening: No early family history.  Breast Cancer Screening: She has no family history of breast cancer.  She is currently breast-feeding Cervical Cancer Screening: Reported as up-to-date.  Follows with GYN         Tetanus - UTD         Labs: Screening labs today. Exercise: Gets regular exercise, running.  Alcohol use: Occasional Smoking/tobacco use: Nonsmoker.    Health  Maintenance  Topic Date Due   COVID-19 Vaccine (7 - 2023-24 season) 01/06/2022   PAP SMEAR-Modifier  04/21/2025   DTaP/Tdap/Td (4 - Td or Tdap) 06/15/2030   INFLUENZA VACCINE  Completed   Hepatitis C Screening  Completed   HIV Screening  Completed   HPV VACCINES  Aged Out    ALLERGIES: Sulfa antibiotics  Current Outpatient Medications on File Prior to Visit  Medication Sig Dispense Refill   Omega-3 Fatty Acids (FISH OIL PO) Take by mouth.     Prenatal Vit-Fe Fumarate-FA (PRENATAL MULTIVITAMIN) TABS tablet Take 1 tablet by mouth at bedtime.     acetaminophen (TYLENOL) 325 MG tablet Take 2 tablets (650 mg total) by mouth every 4 (four) hours as needed (for pain scale < 4). (Patient not taking: Reported on 07/26/2022)     Cholecalciferol (VITAMIN D3 PO) Take by mouth. (Patient not taking: Reported on 07/26/2022)     ferrous sulfate 325 (65 FE) MG tablet Take 325 mg by mouth at bedtime. (Patient not taking: Reported on 07/26/2022)     ibuprofen (ADVIL) 600 MG tablet Take 1 tablet (600 mg total) by mouth every 6 (six) hours as needed. (Patient not taking: Reported on 07/26/2022) 30 tablet 0   NIFEdipine (ADALAT CC) 30 MG 24 hr tablet  Take 1 tablet (30 mg total) by mouth daily. (Patient not taking: Reported on 07/26/2022) 30 tablet 1   No current facility-administered medications on file prior to visit.    Review of Systems  Constitutional:  Negative for chills, fever and unexpected weight change.  HENT:  Negative for congestion.   Respiratory:  Negative for cough.   Cardiovascular:  Negative for chest pain, palpitations and leg swelling.  Gastrointestinal:  Negative for nausea and vomiting.  Musculoskeletal:  Negative for arthralgias and myalgias.  Skin:  Negative for rash.  Neurological:  Negative for headaches.  Hematological:  Negative for adenopathy.  Psychiatric/Behavioral:  Negative for confusion.       Objective:    BP 108/76   Pulse 78   Temp 98.4 F (36.9 C) (Oral)   Ht 5'  9" (1.753 m)   Wt 249 lb (112.9 kg)   LMP  (LMP Unknown)   SpO2 96%   BMI 36.77 kg/m   BP Readings from Last 3 Encounters:  07/26/22 108/76  09/11/21 116/67  03/16/21 102/70   Wt Readings from Last 3 Encounters:  07/26/22 249 lb (112.9 kg)  09/09/21 267 lb (121.1 kg)  03/16/21 245 lb 6.4 oz (111.3 kg)    Physical Exam Vitals reviewed.  Constitutional:      Appearance: She is well-developed.  Eyes:     Conjunctiva/sclera: Conjunctivae normal.  Neck:     Thyroid: No thyroid mass or thyromegaly.  Cardiovascular:     Rate and Rhythm: Normal rate and regular rhythm.     Pulses: Normal pulses.     Heart sounds: Normal heart sounds.  Pulmonary:     Effort: Pulmonary effort is normal.     Breath sounds: Normal breath sounds. No wheezing, rhonchi or rales.  Chest:  Breasts:    Breasts are symmetrical.     Right: No inverted nipple, mass, nipple discharge, skin change or tenderness.     Left: No inverted nipple, mass, nipple discharge, skin change or tenderness.  Lymphadenopathy:     Head:     Right side of head: No submental, submandibular, tonsillar, preauricular, posterior auricular or occipital adenopathy.     Left side of head: No submental, submandibular, tonsillar, preauricular, posterior auricular or occipital adenopathy.     Cervical: No cervical adenopathy.     Right cervical: No superficial, deep or posterior cervical adenopathy.    Left cervical: No superficial, deep or posterior cervical adenopathy.  Skin:    General: Skin is warm and dry.  Neurological:     Mental Status: She is alert.  Psychiatric:        Speech: Speech normal.        Behavior: Behavior normal.        Thought Content: Thought content normal.

## 2022-07-26 NOTE — Telephone Encounter (Signed)
Call [facility] Patient is currently breast-feeding We discussed baseline mammogram as she is in her late 66s.    Would they agree with my recommendation of mammogram AND  bilateral ultrasound due to she is breastfeeding?

## 2022-07-26 NOTE — Assessment & Plan Note (Signed)
Clinical breast exam performed.  Deferred pelvic exam in the absence of complaints and she is also following with GYN.  We discussed baseline mammogram in her 68s which patient would like to have done.  She is currently lactating.   We have a call out to [facility] in regards to advice.  I recommend  consideration for screening mammogram with bilateral ultrasound.  She has previously seen Dr. Bary Castilla when lactating for a mass in which she had ultrasound  done

## 2022-07-26 NOTE — Patient Instructions (Signed)

## 2022-07-27 ENCOUNTER — Encounter: Payer: Self-pay | Admitting: Family Medicine

## 2022-07-27 ENCOUNTER — Ambulatory Visit (INDEPENDENT_AMBULATORY_CARE_PROVIDER_SITE_OTHER): Payer: 59 | Admitting: Family Medicine

## 2022-07-27 ENCOUNTER — Other Ambulatory Visit: Payer: Self-pay

## 2022-07-27 ENCOUNTER — Ambulatory Visit (INDEPENDENT_AMBULATORY_CARE_PROVIDER_SITE_OTHER): Payer: 59

## 2022-07-27 VITALS — BP 110/78 | HR 67 | Ht 69.0 in | Wt 253.4 lb

## 2022-07-27 DIAGNOSIS — G8929 Other chronic pain: Secondary | ICD-10-CM

## 2022-07-27 DIAGNOSIS — M170 Bilateral primary osteoarthritis of knee: Secondary | ICD-10-CM | POA: Diagnosis not present

## 2022-07-27 DIAGNOSIS — M25562 Pain in left knee: Secondary | ICD-10-CM

## 2022-07-27 DIAGNOSIS — M25561 Pain in right knee: Secondary | ICD-10-CM | POA: Diagnosis not present

## 2022-07-27 LAB — CBC WITH DIFFERENTIAL/PLATELET
Basophils Absolute: 0.1 10*3/uL (ref 0.0–0.1)
Basophils Relative: 1 % (ref 0.0–3.0)
Eosinophils Absolute: 0.1 10*3/uL (ref 0.0–0.7)
Eosinophils Relative: 1.8 % (ref 0.0–5.0)
HCT: 39 % (ref 36.0–46.0)
Hemoglobin: 12.8 g/dL (ref 12.0–15.0)
Lymphocytes Relative: 27.6 % (ref 12.0–46.0)
Lymphs Abs: 2.1 10*3/uL (ref 0.7–4.0)
MCHC: 32.9 g/dL (ref 30.0–36.0)
MCV: 83.4 fl (ref 78.0–100.0)
Monocytes Absolute: 0.5 10*3/uL (ref 0.1–1.0)
Monocytes Relative: 6.9 % (ref 3.0–12.0)
Neutro Abs: 4.7 10*3/uL (ref 1.4–7.7)
Neutrophils Relative %: 62.7 % (ref 43.0–77.0)
Platelets: 227 10*3/uL (ref 150.0–400.0)
RBC: 4.68 Mil/uL (ref 3.87–5.11)
RDW: 13.7 % (ref 11.5–15.5)
WBC: 7.5 10*3/uL (ref 4.0–10.5)

## 2022-07-27 LAB — IBC + FERRITIN
Ferritin: 44.8 ng/mL (ref 10.0–291.0)
Iron: 90 ug/dL (ref 42–145)
Saturation Ratios: 30.5 % (ref 20.0–50.0)
TIBC: 295.4 ug/dL (ref 250.0–450.0)
Transferrin: 211 mg/dL — ABNORMAL LOW (ref 212.0–360.0)

## 2022-07-27 LAB — COMPREHENSIVE METABOLIC PANEL
ALT: 12 U/L (ref 0–35)
AST: 13 U/L (ref 0–37)
Albumin: 4.2 g/dL (ref 3.5–5.2)
Alkaline Phosphatase: 46 U/L (ref 39–117)
BUN: 16 mg/dL (ref 6–23)
CO2: 26 mEq/L (ref 19–32)
Calcium: 9.2 mg/dL (ref 8.4–10.5)
Chloride: 103 mEq/L (ref 96–112)
Creatinine, Ser: 0.82 mg/dL (ref 0.40–1.20)
GFR: 90.67 mL/min (ref >60.00)
Glucose, Bld: 115 mg/dL — ABNORMAL HIGH (ref 70–99)
Potassium: 3.8 mEq/L (ref 3.5–5.1)
Sodium: 136 mEq/L (ref 135–145)
Total Bilirubin: 0.3 mg/dL (ref 0.2–1.2)
Total Protein: 7.1 g/dL (ref 6.0–8.3)

## 2022-07-27 LAB — HEMOGLOBIN A1C: Hgb A1c MFr Bld: 5.7 % (ref 4.6–6.5)

## 2022-07-27 LAB — VITAMIN D 25 HYDROXY (VIT D DEFICIENCY, FRACTURES): VITD: 26.25 ng/mL — ABNORMAL LOW (ref 30.00–100.00)

## 2022-07-27 LAB — LIPID PANEL
Cholesterol: 200 mg/dL (ref 0–200)
HDL: 51.7 mg/dL (ref >39.00)
LDL Cholesterol: 121 mg/dL — ABNORMAL HIGH (ref 0–99)
NonHDL: 148.15
Total CHOL/HDL Ratio: 4
Triglycerides: 137 mg/dL (ref 0.0–149.0)
VLDL: 27.4 mg/dL (ref 0.0–40.0)

## 2022-07-27 LAB — TSH: TSH: 0.71 u[IU]/mL (ref 0.35–5.50)

## 2022-07-27 NOTE — Telephone Encounter (Signed)
LVM to call back to office  

## 2022-07-27 NOTE — Progress Notes (Signed)
Jordan Hawks, CMA acting as a Education administrator for Lynne Leader, MD.  Subjective:    CC: Knee pain  HPI: Patient is a 37 year old female presenting with BILAT knee pain ongoing for several years, worsening since giving birth May 2023. She was seen previously by Dr. Tamala Julian in 2021 for bilat knee pain and was referred to [facility]. Patient locates pain to posterior aspect of left knee and anterior aspect of right knee. Also c/o pain at lateral aspect of both knees. Swelling present in both knees.   Knee swelling: yes Mechanical symptoms: giving out, crunching Aggravates: flexion of left knee, working out Treatments tried: started new joint supplement this past Monday. Also taking tart red cherry and turmeric until pregnant, started back after delivery. Ice occasionally after activity for swelling  Dx imaging: 11/05/2019 bilateral knee standing x-ray  Pertinent review of Systems: no fever or chills  Relevant historical information: History of gestational hypertension.  Obesity.  Has 2 small children under the age of 2.   Objective:    Vitals:   07/27/22 1319  BP: 110/78  Pulse: 67  SpO2: 95%   General: Well Developed, well nourished, and in no acute distress.   MSK: Knees bilaterally normal-appearing normal motion with crepitation.  Tender palpation anterior knees.  Stable ligamentous exam intact strength.  Lab and Radiology Results  DG Knee AP/LAT W/Sunrise Left  Result Date: 07/27/2022 CLINICAL DATA:  Bilateral knee pain and swelling for several years EXAM: LEFT KNEE 3 VIEWS; RIGHT KNEE 3 VIEWS COMPARISON:  11/05/2019 FINDINGS: Right knee: Moderate joint space loss and spurring of the medial compartment. Mild joint space loss and spurring of the lateral compartment. Moderate joint space loss and mild spurring of the patellofemoral compartment. Soft tissues are unremarkable. Left knee: Moderate joint space loss and spurring in the medial and lateral compartments. Mild spurring and  joint space loss in the patellofemoral compartment. Soft tissues are unremarkable. IMPRESSION: Left-greater-than-right tricompartmental osteoarthrosis. Electronically Signed   By: Miachel Roux M.D.   On: 07/27/2022 15:19   DG Knee AP/LAT W/Sunrise Right  Result Date: 07/27/2022 CLINICAL DATA:  Bilateral knee pain and swelling for several years EXAM: LEFT KNEE 3 VIEWS; RIGHT KNEE 3 VIEWS COMPARISON:  11/05/2019 FINDINGS: Right knee: Moderate joint space loss and spurring of the medial compartment. Mild joint space loss and spurring of the lateral compartment. Moderate joint space loss and mild spurring of the patellofemoral compartment. Soft tissues are unremarkable. Left knee: Moderate joint space loss and spurring in the medial and lateral compartments. Mild spurring and joint space loss in the patellofemoral compartment. Soft tissues are unremarkable. IMPRESSION: Left-greater-than-right tricompartmental osteoarthrosis. Electronically Signed   By: Miachel Roux M.D.   On: 07/27/2022 15:19    I, Lynne Leader, personally (independently) visualized and performed the interpretation of the images attached in this note.   Impression and Recommendations:    Assessment and Plan: 37 y.o. 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 Her goal is to return to exercise such as running.  We talked about options.  Plan for referral to physical therapy to work on quadricep strengthening.  Voltaren gel could also be helpful.  Steroid injection should be helpful as well but she would like to avoid it for now which I think is reasonable.  Recheck in 6 weeks if not improved after conservative management approach.  At that point likely would consider steroid injection.[name]  PDMP not reviewed this encounter. Orders Placed This Encounter  Procedures  DG Knee AP/LAT W/Sunrise Left    Standing Status:   Future    Number of Occurrences:   1    Standing Expiration Date:   08/27/2022    Order  Specific Question:   Reason for Exam (SYMPTOM  OR DIAGNOSIS REQUIRED)    Answer:   bilat knee pain and swelling    Order Specific Question:   Preferred imaging location?    Answer:   [facility]    Order Specific Question:   Is patient pregnant?    Answer:   No   DG Knee AP/LAT W/Sunrise Right    Standing Status:   Future    Number of Occurrences:   1    Standing Expiration Date:   08/27/2022    Order Specific Question:   Reason for Exam (SYMPTOM  OR DIAGNOSIS REQUIRED)    Answer:   bilat knee pain and swelling    Order Specific Question:   Preferred imaging location?    Answer:   [facility]    Order Specific Question:   Is patient pregnant?    Answer:   No   Ambulatory referral to Physical Therapy    Referral Priority:   Routine    Referral Type:   Physical Medicine    Referral Reason:   Specialty Services Required    Requested Specialty:   Physical Therapy    Number of Visits Requested:   1   No orders of the defined types were placed in this encounter.   Discussed warning signs or symptoms. Please see discharge instructions. Patient expresses understanding.   The above documentation has been reviewed and is accurate and complete Lynne Leader, M.D.

## 2022-07-27 NOTE — Patient Instructions (Signed)
Please get an Xray today before you leave  A referral has been placed for Physical Therapy with [facility] in [place]  See you back in 6 weeks

## 2022-07-28 DIAGNOSIS — M170 Bilateral primary osteoarthritis of knee: Secondary | ICD-10-CM | POA: Insufficient documentation

## 2022-07-28 NOTE — Progress Notes (Signed)
Right knee x-ray shows more mild arthritis.

## 2022-07-28 NOTE — Progress Notes (Signed)
Left knee x-ray shows arthritis in the knee rated as medium.

## 2022-07-28 NOTE — Telephone Encounter (Signed)
Called [facility] and they wanted to know if she had ever had mammogram before and if she had found lump or mass reason she wanted mammogram..Marland KitchenMarland KitchenSpoke to pt and she stated that she had found a lump and she is lactating. that is the reason for the mammogram.

## 2022-07-28 NOTE — Addendum Note (Signed)
Addended by: [place], Oriel Ojo on: 07/28/2022 09:48 AM   Modules accepted: Orders

## 2022-08-01 NOTE — Telephone Encounter (Signed)
LVM to call back to office  

## 2022-08-01 NOTE — Telephone Encounter (Signed)
Call pt I was unaware of mass  Please clarify where she has felt in breast ( using clock face) and which breast so I can order ultrasound in addition to mammogram

## 2022-08-02 NOTE — Telephone Encounter (Signed)
LVM to call back to our office

## 2022-08-03 ENCOUNTER — Ambulatory Visit: Payer: 59 | Admitting: Sports Medicine

## 2022-08-08 NOTE — Telephone Encounter (Signed)
LVM to call back to office letter mailed to pt also on 08/08/22

## 2022-08-10 ENCOUNTER — Telehealth: Payer: Self-pay

## 2022-08-10 NOTE — Telephone Encounter (Signed)
LVM to call back to go over results 

## 2022-09-07 ENCOUNTER — Ambulatory Visit: Payer: 59 | Admitting: Family Medicine

## 2022-10-10 ENCOUNTER — Other Ambulatory Visit: Payer: Self-pay

## 2022-10-12 LAB — OB RESULTS CONSOLE HEPATITIS B SURFACE ANTIGEN: Hepatitis B Surface Ag: NEGATIVE

## 2022-10-12 LAB — OB RESULTS CONSOLE RUBELLA ANTIBODY, IGM: Rubella: IMMUNE

## 2022-10-12 LAB — OB RESULTS CONSOLE RPR: RPR: NONREACTIVE

## 2022-10-12 LAB — OB RESULTS CONSOLE HIV ANTIBODY (ROUTINE TESTING): HIV: NONREACTIVE

## 2022-10-12 LAB — OB RESULTS CONSOLE ANTIBODY SCREEN: Antibody Screen: NEGATIVE

## 2022-10-15 ENCOUNTER — Other Ambulatory Visit: Payer: Self-pay

## 2022-10-16 ENCOUNTER — Other Ambulatory Visit: Payer: Self-pay

## 2022-10-16 LAB — OB RESULTS CONSOLE GC/CHLAMYDIA
Chlamydia: NEGATIVE
Neisseria Gonorrhea: NEGATIVE

## 2022-10-19 ENCOUNTER — Other Ambulatory Visit: Payer: Self-pay

## 2022-11-16 ENCOUNTER — Encounter: Payer: Self-pay | Admitting: Family

## 2022-11-17 ENCOUNTER — Other Ambulatory Visit: Payer: Self-pay | Admitting: Obstetrics and Gynecology

## 2022-11-17 DIAGNOSIS — Z3A19 19 weeks gestation of pregnancy: Secondary | ICD-10-CM

## 2022-11-17 DIAGNOSIS — O09521 Supervision of elderly multigravida, first trimester: Secondary | ICD-10-CM

## 2022-11-17 DIAGNOSIS — Z363 Encounter for antenatal screening for malformations: Secondary | ICD-10-CM

## 2022-12-08 ENCOUNTER — Encounter: Payer: Self-pay | Admitting: *Deleted

## 2022-12-08 DIAGNOSIS — O3660X0 Maternal care for excessive fetal growth, unspecified trimester, not applicable or unspecified: Secondary | ICD-10-CM | POA: Insufficient documentation

## 2022-12-08 DIAGNOSIS — O3410 Maternal care for benign tumor of corpus uteri, unspecified trimester: Secondary | ICD-10-CM | POA: Insufficient documentation

## 2022-12-08 DIAGNOSIS — O99210 Obesity complicating pregnancy, unspecified trimester: Secondary | ICD-10-CM | POA: Insufficient documentation

## 2022-12-08 DIAGNOSIS — D259 Leiomyoma of uterus, unspecified: Secondary | ICD-10-CM | POA: Insufficient documentation

## 2022-12-13 ENCOUNTER — Encounter: Payer: Self-pay | Admitting: *Deleted

## 2022-12-13 ENCOUNTER — Ambulatory Visit: Payer: 59 | Attending: Obstetrics and Gynecology

## 2022-12-13 ENCOUNTER — Ambulatory Visit: Payer: 59

## 2022-12-13 ENCOUNTER — Other Ambulatory Visit: Payer: Self-pay | Admitting: Obstetrics and Gynecology

## 2022-12-13 VITALS — BP 123/69 | HR 83

## 2022-12-13 DIAGNOSIS — D259 Leiomyoma of uterus, unspecified: Secondary | ICD-10-CM | POA: Diagnosis present

## 2022-12-13 DIAGNOSIS — Z3A19 19 weeks gestation of pregnancy: Secondary | ICD-10-CM | POA: Diagnosis not present

## 2022-12-13 DIAGNOSIS — O99210 Obesity complicating pregnancy, unspecified trimester: Secondary | ICD-10-CM | POA: Insufficient documentation

## 2022-12-13 DIAGNOSIS — O3410 Maternal care for benign tumor of corpus uteri, unspecified trimester: Secondary | ICD-10-CM

## 2022-12-13 DIAGNOSIS — O3660X0 Maternal care for excessive fetal growth, unspecified trimester, not applicable or unspecified: Secondary | ICD-10-CM | POA: Insufficient documentation

## 2022-12-13 DIAGNOSIS — Z363 Encounter for antenatal screening for malformations: Secondary | ICD-10-CM

## 2022-12-13 DIAGNOSIS — O09521 Supervision of elderly multigravida, first trimester: Secondary | ICD-10-CM | POA: Diagnosis not present

## 2023-01-05 ENCOUNTER — Other Ambulatory Visit: Payer: Self-pay | Admitting: *Deleted

## 2023-01-05 DIAGNOSIS — O99212 Obesity complicating pregnancy, second trimester: Secondary | ICD-10-CM

## 2023-01-05 DIAGNOSIS — O09522 Supervision of elderly multigravida, second trimester: Secondary | ICD-10-CM

## 2023-01-05 DIAGNOSIS — Z362 Encounter for other antenatal screening follow-up: Secondary | ICD-10-CM

## 2023-01-05 DIAGNOSIS — Z8759 Personal history of other complications of pregnancy, childbirth and the puerperium: Secondary | ICD-10-CM

## 2023-01-10 ENCOUNTER — Ambulatory Visit: Payer: 59 | Admitting: Family

## 2023-01-18 ENCOUNTER — Ambulatory Visit: Payer: 59 | Attending: Obstetrics and Gynecology

## 2023-01-18 ENCOUNTER — Ambulatory Visit: Payer: 59 | Admitting: *Deleted

## 2023-01-18 VITALS — BP 122/70 | HR 78

## 2023-01-18 DIAGNOSIS — O99212 Obesity complicating pregnancy, second trimester: Secondary | ICD-10-CM | POA: Diagnosis not present

## 2023-01-18 DIAGNOSIS — O09892 Supervision of other high risk pregnancies, second trimester: Secondary | ICD-10-CM | POA: Diagnosis not present

## 2023-01-18 DIAGNOSIS — O09292 Supervision of pregnancy with other poor reproductive or obstetric history, second trimester: Secondary | ICD-10-CM | POA: Insufficient documentation

## 2023-01-18 DIAGNOSIS — O321XX0 Maternal care for breech presentation, not applicable or unspecified: Secondary | ICD-10-CM | POA: Diagnosis not present

## 2023-01-18 DIAGNOSIS — Z3492 Encounter for supervision of normal pregnancy, unspecified, second trimester: Secondary | ICD-10-CM

## 2023-01-18 DIAGNOSIS — Z148 Genetic carrier of other disease: Secondary | ICD-10-CM | POA: Insufficient documentation

## 2023-01-18 DIAGNOSIS — Z3A24 24 weeks gestation of pregnancy: Secondary | ICD-10-CM | POA: Insufficient documentation

## 2023-01-18 DIAGNOSIS — Z8759 Personal history of other complications of pregnancy, childbirth and the puerperium: Secondary | ICD-10-CM

## 2023-01-18 DIAGNOSIS — O402XX0 Polyhydramnios, second trimester, not applicable or unspecified: Secondary | ICD-10-CM | POA: Diagnosis not present

## 2023-01-18 DIAGNOSIS — O09522 Supervision of elderly multigravida, second trimester: Secondary | ICD-10-CM | POA: Diagnosis not present

## 2023-01-18 DIAGNOSIS — E669 Obesity, unspecified: Secondary | ICD-10-CM | POA: Diagnosis not present

## 2023-01-18 DIAGNOSIS — O285 Abnormal chromosomal and genetic finding on antenatal screening of mother: Secondary | ICD-10-CM

## 2023-01-18 DIAGNOSIS — Z362 Encounter for other antenatal screening follow-up: Secondary | ICD-10-CM

## 2023-02-14 ENCOUNTER — Other Ambulatory Visit: Payer: Self-pay | Admitting: *Deleted

## 2023-02-14 ENCOUNTER — Ambulatory Visit: Payer: 59 | Attending: Obstetrics and Gynecology

## 2023-02-14 ENCOUNTER — Ambulatory Visit: Payer: 59 | Admitting: *Deleted

## 2023-02-14 VITALS — BP 128/64 | HR 75

## 2023-02-14 DIAGNOSIS — O09522 Supervision of elderly multigravida, second trimester: Secondary | ICD-10-CM | POA: Insufficient documentation

## 2023-02-14 DIAGNOSIS — Z362 Encounter for other antenatal screening follow-up: Secondary | ICD-10-CM | POA: Insufficient documentation

## 2023-02-14 DIAGNOSIS — O09523 Supervision of elderly multigravida, third trimester: Secondary | ICD-10-CM

## 2023-02-14 DIAGNOSIS — O99212 Obesity complicating pregnancy, second trimester: Secondary | ICD-10-CM | POA: Diagnosis present

## 2023-02-14 DIAGNOSIS — O99213 Obesity complicating pregnancy, third trimester: Secondary | ICD-10-CM

## 2023-02-14 DIAGNOSIS — E669 Obesity, unspecified: Secondary | ICD-10-CM | POA: Diagnosis not present

## 2023-02-14 DIAGNOSIS — Z3A28 28 weeks gestation of pregnancy: Secondary | ICD-10-CM

## 2023-02-14 DIAGNOSIS — O3660X0 Maternal care for excessive fetal growth, unspecified trimester, not applicable or unspecified: Secondary | ICD-10-CM

## 2023-02-14 DIAGNOSIS — Z8759 Personal history of other complications of pregnancy, childbirth and the puerperium: Secondary | ICD-10-CM | POA: Diagnosis present

## 2023-02-14 DIAGNOSIS — O09293 Supervision of pregnancy with other poor reproductive or obstetric history, third trimester: Secondary | ICD-10-CM | POA: Diagnosis not present

## 2023-02-14 DIAGNOSIS — Z148 Genetic carrier of other disease: Secondary | ICD-10-CM

## 2023-02-14 DIAGNOSIS — O285 Abnormal chromosomal and genetic finding on antenatal screening of mother: Secondary | ICD-10-CM

## 2023-03-01 ENCOUNTER — Encounter: Payer: Self-pay | Admitting: *Deleted

## 2023-03-01 DIAGNOSIS — O09893 Supervision of other high risk pregnancies, third trimester: Secondary | ICD-10-CM | POA: Insufficient documentation

## 2023-03-01 DIAGNOSIS — Z8759 Personal history of other complications of pregnancy, childbirth and the puerperium: Secondary | ICD-10-CM | POA: Insufficient documentation

## 2023-03-02 ENCOUNTER — Other Ambulatory Visit: Payer: Self-pay

## 2023-03-15 ENCOUNTER — Other Ambulatory Visit: Payer: Self-pay | Admitting: *Deleted

## 2023-03-15 ENCOUNTER — Other Ambulatory Visit: Payer: Self-pay

## 2023-03-15 ENCOUNTER — Ambulatory Visit: Payer: 59 | Attending: Maternal & Fetal Medicine

## 2023-03-15 DIAGNOSIS — O285 Abnormal chromosomal and genetic finding on antenatal screening of mother: Secondary | ICD-10-CM

## 2023-03-15 DIAGNOSIS — Z3A32 32 weeks gestation of pregnancy: Secondary | ICD-10-CM

## 2023-03-15 DIAGNOSIS — Z148 Genetic carrier of other disease: Secondary | ICD-10-CM

## 2023-03-15 DIAGNOSIS — E669 Obesity, unspecified: Secondary | ICD-10-CM

## 2023-03-15 DIAGNOSIS — O09523 Supervision of elderly multigravida, third trimester: Secondary | ICD-10-CM

## 2023-03-15 DIAGNOSIS — O09293 Supervision of pregnancy with other poor reproductive or obstetric history, third trimester: Secondary | ICD-10-CM

## 2023-03-15 DIAGNOSIS — O99213 Obesity complicating pregnancy, third trimester: Secondary | ICD-10-CM | POA: Insufficient documentation

## 2023-04-12 ENCOUNTER — Ambulatory Visit: Payer: 59 | Attending: Maternal & Fetal Medicine

## 2023-04-12 ENCOUNTER — Other Ambulatory Visit: Payer: Self-pay

## 2023-04-12 ENCOUNTER — Ambulatory Visit: Payer: 59 | Admitting: *Deleted

## 2023-04-12 VITALS — BP 119/71 | HR 78

## 2023-04-12 DIAGNOSIS — D259 Leiomyoma of uterus, unspecified: Secondary | ICD-10-CM | POA: Insufficient documentation

## 2023-04-12 DIAGNOSIS — O285 Abnormal chromosomal and genetic finding on antenatal screening of mother: Secondary | ICD-10-CM

## 2023-04-12 DIAGNOSIS — O09293 Supervision of pregnancy with other poor reproductive or obstetric history, third trimester: Secondary | ICD-10-CM | POA: Diagnosis not present

## 2023-04-12 DIAGNOSIS — O09523 Supervision of elderly multigravida, third trimester: Secondary | ICD-10-CM | POA: Diagnosis present

## 2023-04-12 DIAGNOSIS — Z3A36 36 weeks gestation of pregnancy: Secondary | ICD-10-CM

## 2023-04-12 DIAGNOSIS — E669 Obesity, unspecified: Secondary | ICD-10-CM | POA: Diagnosis not present

## 2023-04-12 DIAGNOSIS — O99213 Obesity complicating pregnancy, third trimester: Secondary | ICD-10-CM | POA: Diagnosis not present

## 2023-04-12 LAB — OB RESULTS CONSOLE GBS: GBS: POSITIVE

## 2023-04-13 ENCOUNTER — Ambulatory Visit: Payer: 59 | Admitting: Cardiology

## 2023-04-18 ENCOUNTER — Other Ambulatory Visit: Payer: Self-pay | Admitting: Obstetrics and Gynecology

## 2023-04-27 ENCOUNTER — Telehealth (HOSPITAL_COMMUNITY): Payer: Self-pay | Admitting: *Deleted

## 2023-04-27 NOTE — Telephone Encounter (Signed)
Preadmission screen  

## 2023-04-30 ENCOUNTER — Encounter (HOSPITAL_COMMUNITY): Payer: Self-pay

## 2023-05-03 ENCOUNTER — Encounter (HOSPITAL_COMMUNITY): Payer: Self-pay | Admitting: *Deleted

## 2023-05-03 ENCOUNTER — Telehealth (HOSPITAL_COMMUNITY): Payer: Self-pay | Admitting: *Deleted

## 2023-05-03 NOTE — Telephone Encounter (Signed)
Preadmission screen  

## 2023-05-05 ENCOUNTER — Inpatient Hospital Stay (HOSPITAL_COMMUNITY): Payer: 59

## 2023-05-08 ENCOUNTER — Encounter (HOSPITAL_COMMUNITY): Payer: Self-pay | Admitting: Obstetrics and Gynecology

## 2023-05-08 ENCOUNTER — Other Ambulatory Visit: Payer: Self-pay

## 2023-05-08 ENCOUNTER — Inpatient Hospital Stay (HOSPITAL_COMMUNITY): Payer: 59 | Admitting: Anesthesiology

## 2023-05-08 ENCOUNTER — Inpatient Hospital Stay (HOSPITAL_COMMUNITY)
Admission: RE | Admit: 2023-05-08 | Discharge: 2023-05-11 | DRG: 788 | Disposition: A | Discharge status: Home / Self Care | Payer: 59 | Attending: Obstetrics and Gynecology | Admitting: Obstetrics and Gynecology

## 2023-05-08 DIAGNOSIS — O99824 Streptococcus B carrier state complicating childbirth: Principal | ICD-10-CM | POA: Diagnosis present

## 2023-05-08 DIAGNOSIS — O480 Post-term pregnancy: Secondary | ICD-10-CM | POA: Diagnosis not present

## 2023-05-08 DIAGNOSIS — Z833 Family history of diabetes mellitus: Secondary | ICD-10-CM | POA: Diagnosis not present

## 2023-05-08 DIAGNOSIS — Z3A40 40 weeks gestation of pregnancy: Secondary | ICD-10-CM

## 2023-05-08 DIAGNOSIS — O99820 Streptococcus B carrier state complicating pregnancy: Secondary | ICD-10-CM | POA: Diagnosis not present

## 2023-05-08 DIAGNOSIS — O09523 Supervision of elderly multigravida, third trimester: Secondary | ICD-10-CM | POA: Diagnosis not present

## 2023-05-08 DIAGNOSIS — O09529 Supervision of elderly multigravida, unspecified trimester: Principal | ICD-10-CM

## 2023-05-08 DIAGNOSIS — Z8249 Family history of ischemic heart disease and other diseases of the circulatory system: Secondary | ICD-10-CM

## 2023-05-08 DIAGNOSIS — O9902 Anemia complicating childbirth: Secondary | ICD-10-CM | POA: Diagnosis present

## 2023-05-08 DIAGNOSIS — O26893 Other specified pregnancy related conditions, third trimester: Secondary | ICD-10-CM | POA: Diagnosis present

## 2023-05-08 LAB — COMPREHENSIVE METABOLIC PANEL
ALT: 18 U/L (ref 0–44)
AST: 14 U/L — ABNORMAL LOW (ref 15–41)
Albumin: 2.7 g/dL — ABNORMAL LOW (ref 3.5–5.0)
Alkaline Phosphatase: 76 U/L (ref 38–126)
Anion gap: 11 (ref 5–15)
BUN: 8 mg/dL (ref 6–20)
CO2: 20 mmol/L — ABNORMAL LOW (ref 22–32)
Calcium: 9.1 mg/dL (ref 8.9–10.3)
Chloride: 103 mmol/L (ref 98–111)
Creatinine, Ser: 0.63 mg/dL (ref 0.44–1.00)
GFR, Estimated: >60 mL/min (ref >60)
Glucose, Bld: 112 mg/dL — ABNORMAL HIGH (ref 70–99)
Potassium: 3.7 mmol/L (ref 3.5–5.1)
Sodium: 134 mmol/L — ABNORMAL LOW (ref 135–145)
Total Bilirubin: 0.4 mg/dL (ref 0.0–1.2)
Total Protein: 6.1 g/dL — ABNORMAL LOW (ref 6.5–8.1)

## 2023-05-08 LAB — CBC
HCT: 36.1 % (ref 36.0–46.0)
Hemoglobin: 12.4 g/dL (ref 12.0–15.0)
MCH: 28.1 pg (ref 26.0–34.0)
MCHC: 34.3 g/dL (ref 30.0–36.0)
MCV: 81.7 fL (ref 80.0–100.0)
Platelets: 205 10*3/uL (ref 150–400)
RBC: 4.42 MIL/uL (ref 3.87–5.11)
RDW: 13.2 % (ref 11.5–15.5)
WBC: 7.1 10*3/uL (ref 4.0–10.5)
nRBC: 0 % (ref 0.0–0.2)

## 2023-05-08 LAB — PROTEIN / CREATININE RATIO, URINE
Creatinine, Urine: 65 mg/dL
Protein Creatinine Ratio: 0.09 mg/mg{creat} (ref 0.00–0.15)
Total Protein, Urine: 6 mg/dL

## 2023-05-08 LAB — TYPE AND SCREEN
ABO/RH(D): B POS
Antibody Screen: NEGATIVE

## 2023-05-08 LAB — LACTATE DEHYDROGENASE: LDH: 109 U/L (ref 98–192)

## 2023-05-08 LAB — URIC ACID: Uric Acid, Serum: 4 mg/dL (ref 2.5–7.1)

## 2023-05-08 MED ORDER — FLEET ENEMA RE ENEM
1.0000 | ENEMA | RECTAL | Status: DC | PRN
Start: 2023-05-08 — End: 2023-05-09

## 2023-05-08 MED ORDER — OXYTOCIN-SODIUM CHLORIDE 30-0.9 UT/500ML-% IV SOLN: 2.5000 [IU]/h | INTRAVENOUS | Status: DC

## 2023-05-08 MED ORDER — LACTATED RINGERS IV SOLN
500.0000 mL | Freq: Once | INTRAVENOUS | Status: AC
  Administered 2023-05-09: 500 mL via INTRAVENOUS

## 2023-05-08 MED ORDER — TERBUTALINE SULFATE 1 MG/ML IJ SOLN
0.2500 mg | Freq: Once | INTRAMUSCULAR | Status: AC | PRN
  Administered 2023-05-09: 0.25 mg via SUBCUTANEOUS
  Filled 2023-05-08: qty 1

## 2023-05-08 MED ORDER — FENTANYL CITRATE (PF) 100 MCG/2ML IJ SOLN: 50.0000 ug | INTRAMUSCULAR | Status: DC | PRN

## 2023-05-08 MED ORDER — EPHEDRINE 5 MG/ML INJ: 10.0000 mg | INTRAVENOUS | Status: DC | PRN

## 2023-05-08 MED ORDER — OXYTOCIN BOLUS FROM INFUSION: 333.0000 mL | Freq: Once | INTRAVENOUS | Status: DC

## 2023-05-08 MED ORDER — FENTANYL-BUPIVACAINE-NACL 0.5-0.125-0.9 MG/250ML-% EP SOLN
12.0000 mL/h | EPIDURAL | Status: DC | PRN
  Administered 2023-05-08: 12 mL/h via EPIDURAL
  Filled 2023-05-08: qty 250

## 2023-05-08 MED ORDER — LACTATED RINGERS IV SOLN
INTRAVENOUS | Status: DC
  Administered 2023-05-08: 125 mL/h via INTRAVENOUS

## 2023-05-08 MED ORDER — ACETAMINOPHEN 325 MG PO TABS: 650.0000 mg | ORAL_TABLET | ORAL | Status: DC | PRN

## 2023-05-08 MED ORDER — OXYTOCIN-SODIUM CHLORIDE 30-0.9 UT/500ML-% IV SOLN
1.0000 m[IU]/min | INTRAVENOUS | Status: DC
  Administered 2023-05-08: 6 m[IU]/min via INTRAVENOUS
  Administered 2023-05-08: 2 m[IU]/min via INTRAVENOUS
  Filled 2023-05-08: qty 500

## 2023-05-08 MED ORDER — PENICILLIN G POTASSIUM 5000000 UNITS IJ SOLR: 3.0000 10*6.[IU] | INTRAMUSCULAR | Status: DC

## 2023-05-08 MED ORDER — PENICILLIN G POT IN DEXTROSE 60000 UNIT/ML IV SOLN
3.0000 10*6.[IU] | INTRAVENOUS | Status: DC
Start: 2023-05-08 — End: 2023-05-09
  Administered 2023-05-08 – 2023-05-09 (×2): 3 10*6.[IU] via INTRAVENOUS
  Filled 2023-05-08 (×4): qty 50

## 2023-05-08 MED ORDER — LACTATED RINGERS IV SOLN
500.0000 mL | INTRAVENOUS | Status: DC | PRN
  Administered 2023-05-09: 500 mL via INTRAVENOUS

## 2023-05-08 MED ORDER — LIDOCAINE HCL (PF) 1 % IJ SOLN
INTRAMUSCULAR | Status: DC | PRN
  Administered 2023-05-08: 6 mL via EPIDURAL
  Administered 2023-05-08: 5 mL via EPIDURAL

## 2023-05-08 MED ORDER — PHENYLEPHRINE 80 MCG/ML (10ML) SYRINGE FOR IV PUSH (FOR BLOOD PRESSURE SUPPORT)
80.0000 ug | PREFILLED_SYRINGE | INTRAVENOUS | Status: DC | PRN
  Administered 2023-05-09: 80 ug via INTRAVENOUS
  Filled 2023-05-08: qty 10

## 2023-05-08 MED ORDER — PENICILLIN G POTASSIUM 5000000 UNITS IJ SOLR: 5.0000 10*6.[IU] | Freq: Once | INTRAMUSCULAR | Status: DC

## 2023-05-08 MED ORDER — DIPHENHYDRAMINE HCL 50 MG/ML IJ SOLN: 12.5000 mg | INTRAMUSCULAR | Status: DC | PRN

## 2023-05-08 MED ORDER — LIDOCAINE HCL (PF) 1 % IJ SOLN: 30.0000 mL | INTRAMUSCULAR | Status: DC | PRN

## 2023-05-08 MED ORDER — SOD CITRATE-CITRIC ACID 500-334 MG/5ML PO SOLN
30.0000 mL | ORAL | Status: DC | PRN
  Administered 2023-05-09: 30 mL via ORAL
  Filled 2023-05-08: qty 30

## 2023-05-08 MED ORDER — SODIUM CHLORIDE 0.9 % IV SOLN
5.0000 10*6.[IU] | Freq: Once | INTRAVENOUS | Status: AC
  Administered 2023-05-08: 5 10*6.[IU] via INTRAVENOUS
  Filled 2023-05-08: qty 5

## 2023-05-08 MED ORDER — PHENYLEPHRINE 80 MCG/ML (10ML) SYRINGE FOR IV PUSH (FOR BLOOD PRESSURE SUPPORT)
80.0000 ug | PREFILLED_SYRINGE | INTRAVENOUS | Status: DC | PRN
  Administered 2023-05-08 – 2023-05-09 (×2): 80 ug via INTRAVENOUS

## 2023-05-08 MED ORDER — ONDANSETRON HCL 4 MG/2ML IJ SOLN
4.0000 mg | Freq: Four times a day (QID) | INTRAMUSCULAR | Status: DC | PRN
Start: 2023-05-08 — End: 2023-05-09
  Administered 2023-05-09: 4 mg via INTRAVENOUS
  Filled 2023-05-08: qty 2

## 2023-05-08 NOTE — H&P (Signed)
 Jamie Bean is a 37 y.o. female presenting for IOL d/t AMA. OB History     Gravida  3   Para  2   Term  2   Preterm  0   AB  0   Living  2      SAB  0   IAB  0   Ectopic  0   Multiple  0   Live Births  2          Past Medical History:  Diagnosis Date   Anemia    Anxiety    Arthritis    Constipation    Female infertility    Gestational HTN    Joint pain    Knee pain    Palpitations    Postpartum hypertension    UTI (urinary tract infection)    Vitamin D  deficiency    Past Surgical History:  Procedure Laterality Date   INCISE AND DRAIN ABCESS     1994   Family History: family history includes Alcohol abuse in her maternal grandmother and maternal uncle; Arthritis in her father; Cancer in her paternal grandfather; Diabetes in her brother, paternal aunt, and paternal grandmother; Hyperlipidemia in her maternal grandfather and maternal grandmother; Hypertension in her maternal grandfather and maternal grandmother; Mental illness in her maternal uncle and paternal aunt; Obesity in her father; Renal cancer (age of onset: 31) in her maternal grandmother; Stroke in her paternal grandmother. Social History:  reports that she has never smoked. She has never used smokeless tobacco. She reports that she does not currently use alcohol. She reports that she does not use drugs.     Maternal Diabetes: No Genetic Screening: Normal low risk female, SMA carrier and FOB negative Maternal Ultrasounds/Referrals: Normal Fetal Ultrasounds or other Referrals:  None Maternal Substance Abuse:  No Significant Maternal Medications:  Meds include: Other: aspirin Significant Maternal Lab Results:  Group B Strep positive Number of Prenatal Visits:greater than 3 verified prenatal visits Maternal Vaccinations:TDap and Flu 2023 and 02/2023 respectively Other Comments:  None  Review of Systems No F/C/N/V/D  History   Blood pressure (!) 142/78, pulse (!) 110, temperature 98 F  (36.7 C), temperature source Oral, resp. rate 20, height 5' 9 (1.753 m), weight 124.1 kg, last menstrual period 07/31/2022, SpO2 98%, currently breastfeeding. Exam Physical Exam   Lungs unlabored CV RRR Abdomen gravid, NT Extremities no calf tenderness Cvx in office 05/04/23 3/50/-1  FHT 120, + accels, mod variability, no decels Toco infrequent  Prenatal labs: ABO, Rh:  B Positive Antibody: Negative (06/06 0000) Rubella: Immune (06/06 0000) RPR: Nonreactive (06/06 0000)  HBsAg: Negative (06/06 0000)  HIV: Non-reactive (06/06 0000)  GBS: Positive/-- (12/05 0000)   Assessment/Plan: 60bn [id number] at 40 1/7wks being admitted for IOL d/t AMA.  Plan pitocin  as cervix when last checked in the office was favorable.  Fetal status is reassuring with cat 1 tracing.  GBS positive, will order PCN for prophylaxis.  Pain medication upon request. Will check GHTN labs and PCR as well d/t elevated SBP.  Jamie Bean 05/08/2023, 4:03 PM

## 2023-05-08 NOTE — Progress Notes (Addendum)
 Jamie Bean is a 38 y.o. G3P2002 at [redacted]w[redacted]d   Subjective: No complaints.  Husband and Doula in the room. Feels mild ctxs (3/10).  Objective: BP 132/68   Pulse 82   Temp 98.3 F (36.8 C) (Oral)   Resp 16   Ht 5' 9 (1.753 m)   Wt 124.1 kg   LMP 07/31/2022   SpO2 98%   BMI 40.39 kg/m  I/O last 3 completed shifts: In: -  Out: 250 [Urine:250] No intake/output data recorded.  FHT:  FHR: 125 bpm, variability: moderate,  accelerations:  Present,  decelerations:  Absent UC:  q2-5 minutes SVE:   Dilation: 4 Effacement (%): 50 Station: -2 Exam by:: Dr Jamie Bean  AROM clear fluid @ 2140  Labs: Lab Results  Component Value Date   WBC 7.1 05/08/2023   HGB 12.4 05/08/2023   HCT 36.1 05/08/2023   MCV 81.7 05/08/2023   PLT 205 05/08/2023    Assessment / Plan: IOL d/t AMA  Labor:  s/p AROM, pitocin  decreased from 11mu/min to 4mu.  Cont to titrate pitocin  as indicated. Preeclampsia:  no signs or symptoms of toxicity.  Labs wnl and PCR 0.09. Fetal Wellbeing:  Category I Pain Control:  Labor support without medications.  Pain medication upon request. I/D:   GBS + on PCN Anticipated MOD:  NSVD  Jamie CINDERELLA Henry, MD 05/08/2023, 10:25 PM

## 2023-05-08 NOTE — Anesthesia Preprocedure Evaluation (Addendum)
 Anesthesia Evaluation  Patient identified by MRN, date of birth, ID band Patient awake    Reviewed: Allergy & Precautions, NPO status , Patient's Chart, lab work & pertinent test results  Airway Mallampati: III  TM Distance: >3 FB Neck ROM: Full    Dental  (+) Teeth Intact, Dental Advisory Given   Pulmonary neg pulmonary ROS   Pulmonary exam normal breath sounds clear to auscultation       Cardiovascular hypertension, Normal cardiovascular exam Rhythm:Regular Rate:Normal     Neuro/Psych  PSYCHIATRIC DISORDERS Anxiety     negative neurological ROS     GI/Hepatic negative GI ROS, Neg liver ROS,,,  Endo/Other    Class 3 obesityObesity   Renal/GU negative Renal ROS  negative genitourinary   Musculoskeletal  (+) Arthritis , Osteoarthritis,    Abdominal  (+) + obese  Peds  Hematology  (+) Blood dyscrasia, anemia Plt 191k   Anesthesia Other Findings   Reproductive/Obstetrics                             Anesthesia Physical Anesthesia Plan  ASA: 3 and emergent  Anesthesia Plan: Epidural   Post-op Pain Management: Minimal or no pain anticipated and Regional block*   Induction: Intravenous  PONV Risk Score and Plan: 2 and Treatment may vary due to age or medical condition and Scopolamine patch - Pre-op  Airway Management Planned: Natural Airway  Additional Equipment: None and Fetal Monitoring  Intra-op Plan:   Post-operative Plan:   Informed Consent: I have reviewed the patients History and Physical, chart, labs and discussed the procedure including the risks, benefits and alternatives for the proposed anesthesia with the patient or authorized representative who has indicated his/her understanding and acceptance.     Dental advisory given  Plan Discussed with: Anesthesiologist and CRNA  Anesthesia Plan Comments: (Patient for urgent C/Section for non reassuring FHR tracing. Will use  epidural for C/Section. )        Anesthesia Quick Evaluation

## 2023-05-08 NOTE — Anesthesia Procedure Notes (Signed)
 Epidural Patient location during procedure: OB Start time: 05/08/2023 11:41 PM End time: 05/08/2023 11:50 PM  Staffing Anesthesiologist: Jerrye Sharper, MD Performed: anesthesiologist   Preanesthetic Checklist Completed: patient identified, IV checked, site marked, risks and benefits discussed, surgical consent, monitors and equipment checked, pre-op evaluation and timeout performed  Epidural Patient position: sitting Prep: DuraPrep and site prepped and draped Patient monitoring: continuous pulse ox and blood pressure Approach: midline Location: L3-L4 Injection technique: LOR air  Needle:  Needle type: Tuohy  Needle gauge: 17 G Needle length: 9 cm and 9 Needle insertion depth: 7 cm Catheter type: closed end flexible Catheter size: 19 Gauge Catheter at skin depth: 12 cm Test dose: negative and Other  Assessment Events: blood not aspirated, no cerebrospinal fluid, injection not painful, no injection resistance, no paresthesia and negative IV test  Additional Notes Patient identified. Risks and benefits discussed including failed block, incomplete  Pain control, post dural puncture headache, nerve damage, paralysis, blood pressure Changes, nausea, vomiting, reactions to medications-both toxic and allergic and post Partum back pain. All questions were answered. Patient expressed understanding and wished to proceed. Sterile technique was used throughout procedure. Epidural site was Dressed with sterile barrier dressing. No paresthesias, signs of intravascular injection Or signs of intrathecal spread were encountered.  Patient was more comfortable after the epidural was dosed. Please see RN's note for documentation of vital signs and FHR which are stable. Reason for block:procedure for pain

## 2023-05-09 ENCOUNTER — Other Ambulatory Visit: Payer: Self-pay

## 2023-05-09 ENCOUNTER — Encounter (HOSPITAL_COMMUNITY): Payer: Self-pay | Admitting: Obstetrics and Gynecology

## 2023-05-09 ENCOUNTER — Encounter (HOSPITAL_COMMUNITY)
Admission: RE | Disposition: A | Discharge status: Home / Self Care | Payer: Self-pay | Source: Home / Self Care | Attending: Obstetrics and Gynecology

## 2023-05-09 DIAGNOSIS — Z3A40 40 weeks gestation of pregnancy: Secondary | ICD-10-CM

## 2023-05-09 DIAGNOSIS — O480 Post-term pregnancy: Secondary | ICD-10-CM

## 2023-05-09 DIAGNOSIS — O09523 Supervision of elderly multigravida, third trimester: Secondary | ICD-10-CM

## 2023-05-09 DIAGNOSIS — O99820 Streptococcus B carrier state complicating pregnancy: Secondary | ICD-10-CM

## 2023-05-09 LAB — RPR: RPR Ser Ql: NONREACTIVE

## 2023-05-09 SURGERY — Surgical Case
Anesthesia: Epidural

## 2023-05-09 MED ORDER — COCONUT OIL OIL: 1.0000 | TOPICAL_OIL | Status: DC | PRN

## 2023-05-09 MED ORDER — MORPHINE SULFATE (PF) 0.5 MG/ML IJ SOLN
INTRAMUSCULAR | Status: AC
  Filled 2023-05-09: qty 10

## 2023-05-09 MED ORDER — SENNOSIDES-DOCUSATE SODIUM 8.6-50 MG PO TABS
2.0000 | ORAL_TABLET | Freq: Every day | ORAL | Status: DC
  Administered 2023-05-10 – 2023-05-11 (×2): 2 via ORAL
  Filled 2023-05-09 (×2): qty 2

## 2023-05-09 MED ORDER — NALOXONE HCL 4 MG/10ML IJ SOLN: 1.0000 ug/kg/h | INTRAVENOUS | Status: DC | PRN

## 2023-05-09 MED ORDER — KETOROLAC TROMETHAMINE 30 MG/ML IJ SOLN: 30.0000 mg | Freq: Four times a day (QID) | INTRAMUSCULAR | Status: DC | PRN

## 2023-05-09 MED ORDER — IBUPROFEN 600 MG PO TABS
600.0000 mg | ORAL_TABLET | Freq: Four times a day (QID) | ORAL | Status: DC
  Administered 2023-05-09: 600 mg via ORAL
  Filled 2023-05-09: qty 1

## 2023-05-09 MED ORDER — FENTANYL CITRATE (PF) 100 MCG/2ML IJ SOLN
INTRAMUSCULAR | Status: DC | PRN
  Administered 2023-05-09: 100 ug via EPIDURAL

## 2023-05-09 MED ORDER — PHENYLEPHRINE HCL-NACL 20-0.9 MG/250ML-% IV SOLN
INTRAVENOUS | Status: DC | PRN
  Administered 2023-05-09: 60 ug/min via INTRAVENOUS

## 2023-05-09 MED ORDER — DIBUCAINE (PERIANAL) 1 % EX OINT: 1.0000 | TOPICAL_OINTMENT | CUTANEOUS | Status: DC | PRN

## 2023-05-09 MED ORDER — ZOLPIDEM TARTRATE 5 MG PO TABS: 5.0000 mg | ORAL_TABLET | Freq: Every evening | ORAL | Status: DC | PRN

## 2023-05-09 MED ORDER — DIPHENHYDRAMINE HCL 50 MG/ML IJ SOLN: 12.5000 mg | INTRAMUSCULAR | Status: DC | PRN

## 2023-05-09 MED ORDER — LACTATED RINGERS IV SOLN: INTRAVENOUS | Status: DC | PRN

## 2023-05-09 MED ORDER — ACETAMINOPHEN 500 MG PO TABS
1000.0000 mg | ORAL_TABLET | Freq: Four times a day (QID) | ORAL | Status: DC
  Administered 2023-05-09 – 2023-05-11 (×9): 1000 mg via ORAL
  Filled 2023-05-09 (×9): qty 2

## 2023-05-09 MED ORDER — SIMETHICONE 80 MG PO CHEW
80.0000 mg | CHEWABLE_TABLET | ORAL | Status: DC | PRN
  Administered 2023-05-11: 80 mg via ORAL
  Filled 2023-05-09: qty 1

## 2023-05-09 MED ORDER — DEXAMETHASONE SODIUM PHOSPHATE 10 MG/ML IJ SOLN
INTRAMUSCULAR | Status: DC | PRN
  Administered 2023-05-09: 10 mg via INTRAVENOUS

## 2023-05-09 MED ORDER — DIPHENHYDRAMINE HCL 25 MG PO CAPS: 25.0000 mg | ORAL_CAPSULE | ORAL | Status: DC | PRN

## 2023-05-09 MED ORDER — OXYTOCIN-SODIUM CHLORIDE 30-0.9 UT/500ML-% IV SOLN
2.5000 [IU]/h | INTRAVENOUS | Status: AC
Start: 2023-05-09 — End: 2023-05-09
  Administered 2023-05-09: 2.5 [IU]/h via INTRAVENOUS
  Filled 2023-05-09: qty 500

## 2023-05-09 MED ORDER — FENTANYL CITRATE (PF) 100 MCG/2ML IJ SOLN
INTRAMUSCULAR | Status: AC
  Filled 2023-05-09: qty 2

## 2023-05-09 MED ORDER — OXYTOCIN-SODIUM CHLORIDE 30-0.9 UT/500ML-% IV SOLN
INTRAVENOUS | Status: DC | PRN
  Administered 2023-05-09: 30 mL via INTRAVENOUS

## 2023-05-09 MED ORDER — SODIUM CHLORIDE 0.9 % IR SOLN
Status: DC | PRN
  Administered 2023-05-09 (×2): 1

## 2023-05-09 MED ORDER — MENTHOL 3 MG MT LOZG: 1.0000 | LOZENGE | OROMUCOSAL | Status: DC | PRN

## 2023-05-09 MED ORDER — ONDANSETRON HCL 4 MG/2ML IJ SOLN
INTRAMUSCULAR | Status: DC | PRN
  Administered 2023-05-09: 4 mg via INTRAVENOUS

## 2023-05-09 MED ORDER — ONDANSETRON HCL 4 MG/2ML IJ SOLN
4.0000 mg | Freq: Three times a day (TID) | INTRAMUSCULAR | Status: DC | PRN
  Administered 2023-05-09: 4 mg via INTRAVENOUS
  Filled 2023-05-09: qty 2

## 2023-05-09 MED ORDER — ENOXAPARIN SODIUM 60 MG/0.6ML IJ SOSY
60.0000 mg | PREFILLED_SYRINGE | INTRAMUSCULAR | Status: DC
  Administered 2023-05-10: 60 mg via SUBCUTANEOUS
  Filled 2023-05-09 (×2): qty 0.6

## 2023-05-09 MED ORDER — SIMETHICONE 80 MG PO CHEW
80.0000 mg | CHEWABLE_TABLET | Freq: Three times a day (TID) | ORAL | Status: DC
Start: 2023-05-09 — End: 2023-05-11
  Administered 2023-05-09 – 2023-05-11 (×7): 80 mg via ORAL
  Filled 2023-05-09 (×7): qty 1

## 2023-05-09 MED ORDER — ACETAMINOPHEN 10 MG/ML IV SOLN
INTRAVENOUS | Status: DC | PRN
  Administered 2023-05-09: 1000 mg via INTRAVENOUS

## 2023-05-09 MED ORDER — PRENATAL MULTIVITAMIN CH
1.0000 | ORAL_TABLET | Freq: Every day | ORAL | Status: DC
  Administered 2023-05-09 – 2023-05-11 (×3): 1 via ORAL
  Filled 2023-05-09 (×3): qty 1

## 2023-05-09 MED ORDER — OXYTOCIN-SODIUM CHLORIDE 30-0.9 UT/500ML-% IV SOLN: 1.0000 m[IU]/min | INTRAVENOUS | Status: DC

## 2023-05-09 MED ORDER — PHENYLEPHRINE 80 MCG/ML (10ML) SYRINGE FOR IV PUSH (FOR BLOOD PRESSURE SUPPORT)
PREFILLED_SYRINGE | INTRAVENOUS | Status: DC | PRN
  Administered 2023-05-09 (×2): 80 ug via INTRAVENOUS

## 2023-05-09 MED ORDER — MORPHINE SULFATE (PF) 0.5 MG/ML IJ SOLN
INTRAMUSCULAR | Status: DC | PRN
  Administered 2023-05-09: 3 mg via EPIDURAL

## 2023-05-09 MED ORDER — TERBUTALINE SULFATE 1 MG/ML IJ SOLN
0.2500 mg | Freq: Once | INTRAMUSCULAR | Status: AC
  Administered 2023-05-09: 0.25 mg via SUBCUTANEOUS

## 2023-05-09 MED ORDER — LIDOCAINE-EPINEPHRINE (PF) 2 %-1:200000 IJ SOLN
INTRAMUSCULAR | Status: DC | PRN
  Administered 2023-05-09 (×2): 5 mL via EPIDURAL

## 2023-05-09 MED ORDER — HYDROMORPHONE HCL 1 MG/ML IJ SOLN: 0.2500 mg | INTRAMUSCULAR | Status: DC | PRN

## 2023-05-09 MED ORDER — SODIUM CHLORIDE 0.9% FLUSH: 3.0000 mL | INTRAVENOUS | Status: DC | PRN

## 2023-05-09 MED ORDER — SODIUM CHLORIDE 0.9 % IV SOLN
INTRAVENOUS | Status: DC | PRN
  Administered 2023-05-09: 500 mg via INTRAVENOUS

## 2023-05-09 MED ORDER — KETOROLAC TROMETHAMINE 30 MG/ML IJ SOLN
30.0000 mg | Freq: Four times a day (QID) | INTRAMUSCULAR | Status: DC
  Administered 2023-05-09: 30 mg via INTRAVENOUS
  Filled 2023-05-09: qty 1

## 2023-05-09 MED ORDER — DIPHENHYDRAMINE HCL 25 MG PO CAPS: 25.0000 mg | ORAL_CAPSULE | Freq: Four times a day (QID) | ORAL | Status: DC | PRN

## 2023-05-09 MED ORDER — OXYCODONE HCL 5 MG PO TABS: 5.0000 mg | ORAL_TABLET | ORAL | Status: DC | PRN

## 2023-05-09 MED ORDER — MEPERIDINE HCL 25 MG/ML IJ SOLN: 6.2500 mg | INTRAMUSCULAR | Status: DC | PRN

## 2023-05-09 MED ORDER — IBUPROFEN 600 MG PO TABS
600.0000 mg | ORAL_TABLET | Freq: Four times a day (QID) | ORAL | Status: DC
  Administered 2023-05-10 – 2023-05-11 (×6): 600 mg via ORAL
  Filled 2023-05-09 (×6): qty 1

## 2023-05-09 MED ORDER — ENOXAPARIN SODIUM 40 MG/0.4ML IJ SOSY: 40.0000 mg | PREFILLED_SYRINGE | INTRAMUSCULAR | Status: DC

## 2023-05-09 MED ORDER — STERILE WATER FOR IRRIGATION IR SOLN
Status: DC | PRN
  Administered 2023-05-09: 1000 mL

## 2023-05-09 MED ORDER — NALOXONE HCL 0.4 MG/ML IJ SOLN: 0.4000 mg | INTRAMUSCULAR | Status: DC | PRN

## 2023-05-09 MED ORDER — KETOROLAC TROMETHAMINE 30 MG/ML IJ SOLN
INTRAMUSCULAR | Status: DC | PRN
  Administered 2023-05-09: 30 mg via INTRAVENOUS

## 2023-05-09 MED ORDER — WITCH HAZEL-GLYCERIN EX PADS
1.0000 | MEDICATED_PAD | CUTANEOUS | Status: DC | PRN
Start: 2023-05-09 — End: 2023-05-11

## 2023-05-09 MED ORDER — CEFAZOLIN SODIUM-DEXTROSE 2-3 GM-%(50ML) IV SOLR
INTRAVENOUS | Status: DC | PRN
  Administered 2023-05-09: 3 g via INTRAVENOUS

## 2023-05-09 SURGICAL SUPPLY — 33 items
BENZOIN TINCTURE PRP APPL 2/3 (GAUZE/BANDAGES/DRESSINGS) ×1 IMPLANT
CHLORAPREP W/TINT 26 (MISCELLANEOUS) ×2 IMPLANT
CLAMP UMBILICAL CORD (MISCELLANEOUS) ×1 IMPLANT
CLOTH BEACON ORANGE TIMEOUT ST (SAFETY) ×1 IMPLANT
DRSG OPSITE POSTOP 4X10 (GAUZE/BANDAGES/DRESSINGS) ×1 IMPLANT
ELECT REM PT RETURN 9FT ADLT (ELECTROSURGICAL) ×1
ELECTRODE REM PT RTRN 9FT ADLT (ELECTROSURGICAL) ×1 IMPLANT
EXTRACTOR VACUUM M CUP 4 TUBE (SUCTIONS) IMPLANT
GAUZE PAD ABD 7.5X8 STRL (GAUZE/BANDAGES/DRESSINGS) IMPLANT
GAUZE SPONGE 4X4 12PLY STRL LF (GAUZE/BANDAGES/DRESSINGS) IMPLANT
GLOVE BIO SURGEON STRL SZ7.5 (GLOVE) ×1 IMPLANT
GLOVE BIOGEL PI IND STRL 7.0 (GLOVE) ×1 IMPLANT
GLOVE BIOGEL PI IND STRL 7.5 (GLOVE) ×1 IMPLANT
GOWN STRL REUS W/TWL LRG LVL3 (GOWN DISPOSABLE) ×2 IMPLANT
KIT ABG SYR 3ML LUER SLIP (SYRINGE) IMPLANT
NDL HYPO 25X5/8 SAFETYGLIDE (NEEDLE) IMPLANT
NEEDLE HYPO 25X5/8 SAFETYGLIDE (NEEDLE) IMPLANT
NS IRRIG 1000ML POUR BTL (IV SOLUTION) ×1 IMPLANT
PACK C SECTION WH (CUSTOM PROCEDURE TRAY) ×1 IMPLANT
PAD OB MATERNITY 4.3X12.25 (PERSONAL CARE ITEMS) ×1 IMPLANT
RTRCTR C-SECT PINK 25CM LRG (MISCELLANEOUS) ×1 IMPLANT
SPONGE T-LAP 18X18 ~~LOC~~+RFID (SPONGE) IMPLANT
STRIP CLOSURE SKIN 1/2X4 (GAUZE/BANDAGES/DRESSINGS) ×1 IMPLANT
SUT CHROMIC 2 0 CT 1 (SUTURE) ×1 IMPLANT
SUT MNCRL 0 VIOLET CTX 36 (SUTURE) ×1 IMPLANT
SUT MNCRL AB 3-0 PS2 27 (SUTURE) ×1 IMPLANT
SUT PLAIN 2 0 XLH (SUTURE) ×1 IMPLANT
SUT VIC AB 0 CT1 36 (SUTURE) ×1 IMPLANT
SUT VIC AB 0 CTX36XBRD ANBCTRL (SUTURE) ×3 IMPLANT
SUT VIC AB 2-0 SH 27XBRD (SUTURE) IMPLANT
TOWEL OR 17X24 6PK STRL BLUE (TOWEL DISPOSABLE) ×1 IMPLANT
TRAY FOLEY W/BAG SLVR 14FR LF (SET/KITS/TRAYS/PACK) ×1 IMPLANT
WATER STERILE IRR 1000ML POUR (IV SOLUTION) ×1 IMPLANT

## 2023-05-09 NOTE — Anesthesia Postprocedure Evaluation (Signed)
 Anesthesia Post Note  Patient: Jamie Bean  Procedure(s) Performed: CESAREAN SECTION     Patient location during evaluation: PACU Anesthesia Type: Epidural Level of consciousness: oriented and awake and alert Pain management: pain level controlled Vital Signs Assessment: post-procedure vital signs reviewed and stable Respiratory status: spontaneous breathing, respiratory function stable and nonlabored ventilation Cardiovascular status: blood pressure returned to baseline and stable Postop Assessment: no headache, no backache, no apparent nausea or vomiting, epidural receding and patient able to bend at knees Anesthetic complications: no   No notable events documented.  Last Vitals:  Vitals:   05/09/23 0615 05/09/23 0630  BP: 113/67 (!) 71/60  Pulse: 84 70  Resp: (!) 26 17  Temp:    SpO2: 98% 100%    Last Pain:  Vitals:   05/09/23 0601  TempSrc:   PainSc: 3    Pain Goal:    LLE Motor Response: Purposeful movement (05/09/23 0630) LLE Sensation: Tingling (05/09/23 0630) RLE Motor Response: Purposeful movement (05/09/23 0630) RLE Sensation: Tingling (05/09/23 0630)     Epidural/Spinal Function Cutaneous sensation: Able to Wiggle Toes (05/09/23 0630), Patient able to flex knees: Yes (05/09/23 0630), Patient able to lift hips off bed: Yes (05/09/23 0630), Back pain beyond tenderness at insertion site: No (05/09/23 0630), Progressively worsening motor and/or sensory loss: No (05/09/23 0630), Bowel and/or bladder incontinence post epidural: No (05/09/23 0630)  Kaelynne Christley A.

## 2023-05-09 NOTE — Lactation Note (Signed)
 This note was copied from a baby's chart. Lactation Consultation Note  Patient Name: Boy Harlyn Rathmann Unijb'd Date: 05/09/2023 Age:40 hours, P3 experienced BF  Reason for consult: Initial assessment;Term Per mom baby attempted to feed short time at 9:30.  Presently mom is eating her lunch and baby is sound asleep.  LC reviewed the 24 hour feeding goals - feed with feeding cues and by 3 hours of not awake place baby STS and offer the breast.  BF in PACU ( doesn't stay how long) and then 2nd feeding less than 5 mins.  LC encouraged mom to call with feeding cues  for Latch assessment.   Maternal Data Does the patient have breastfeeding experience prior to this delivery?: Yes How long did the patient breastfeed?: per mom 1st baby 9  months and 2nd baby 14 months  Feeding Mother's Current Feeding Choice: Breast Milk  LATCH Score Latch of 7 in PACU      Interventions  Education   Discharge Pump: Personal;DEBP;Hands Free (per mom has a DEBP Spectra  and a Hands free - [employer])  Consult Status Consult Status: Follow-up Date: 05/09/23 Follow-up type: In-patient    Rollene Caldron Gavinn Collard 05/09/2023, 12:19 PM

## 2023-05-09 NOTE — Transfer of Care (Signed)
 Immediate Anesthesia Transfer of Care Note  Patient: Jamie Bean  Procedure(s) Performed: CESAREAN SECTION  Patient Location: PACU  Anesthesia Type:Epidural  Level of Consciousness: awake, alert , and oriented  Airway & Oxygen Therapy: Patient Spontanous Breathing  Post-op Assessment: Report given to RN and Post -op Vital signs reviewed and stable  Post vital signs: Reviewed and stable  Last Vitals:  Vitals Value Taken Time  BP 119/65 05/09/23 0541  Temp 36.4 C 05/09/23 0541  Pulse 98 05/09/23 0546  Resp 22 05/09/23 0546  SpO2 100 % 05/09/23 0546  Vitals shown include unfiled device data.  Last Pain:  Vitals:   05/09/23 0355  TempSrc:   PainSc: 0-No pain         Complications: No notable events documented.

## 2023-05-09 NOTE — Lactation Note (Signed)
 This note was copied from a baby's chart. Lactation Consultation Note  Patient Name: Jamie Bean Unijb'd Date: 05/09/2023 Age:40 hours, 2nd LC visit  Reason for consult: Follow-up assessment;Term;Breastfeeding assistance. Mom had called for Latch assessment.  LC checked and changed a wet and large stool diaper.  LC reviewed the benefits of feeding STS feedings until the baby is back to birth weight and gaining well.  Mom is experienced and hand expressed drops 1st and latched the baby in the cradle position, baby took a few sucks, released and LC recommended trying the cross cradle and once the depth obtained and swallows change arms to the cradle.  Baby still feeding with swallows and per mom comfortable.  LC reviewed 24 hour feeding goals - feed with cues and by 3 hours if not awake, check the diaper and offer the breast. ( 8-12 feedings day).  If still hungry after the 1st breast offer the 2nd breast.    Maternal Data Has patient been taught Hand Expression?:  (mom experienced - able to hand express) Does the patient have breastfeeding experience prior to this delivery?: Yes How long did the patient breastfeed?: per mom 1st baby 9  months and 2nd baby 14 months  Feeding Mother's Current Feeding Choice: Breast Milk  LATCH Score Latch: Repeated attempts needed to sustain latch, nipple held in mouth throughout feeding, stimulation needed to elicit sucking reflex.  Audible Swallowing: Spontaneous and intermittent  Type of Nipple: Everted at rest and after stimulation  Comfort (Breast/Nipple): Soft / non-tender  Hold (Positioning): Assistance needed to correctly position infant at breast and maintain latch.  LATCH Score: 8   Lactation Tools Discussed/Used Pump Education: Milk Storage  Interventions Interventions: Breast feeding basics reviewed;Assisted with latch;Skin to skin;Breast massage;Hand express;Reverse pressure;Breast compression;Adjust position;Support  pillows;Position options;Education;LC Services brochure;CDC Guidelines for Breast Pump Cleaning  Discharge Pump: Personal;DEBP;Hands Free  Consult Status Consult Status: Follow-up Date: 05/10/23 Follow-up type: In-patient    Rollene Caldron Kayliegh Boyers 05/09/2023, 1:21 PM

## 2023-05-09 NOTE — Op Note (Signed)
 Cesarean Section Procedure Note  Indications: P2 at 40 1/7wks with fetal intolerance to labor.  I was contacted by Jordan, RN with report of pt being 7cm and upon my arrival pt was about 8cm and s/p epidural.  Tracing over the next few hours was watched very closely and resuscitative measures take with IV fluids, position changes, terbutaline ... I discussed options and recs with the patient and after a lengthy conversation (about ), pt consented to c-section.  Pitocin  was held and could not be restarted d/t severe decels.  Pre-operative Diagnosis: 1.40 1/7wsk 2.Fetal Intolerance of Labor   Post-operative Diagnosis: 1.40 1/7wsk 2.Fetal Intolerance of Labor 3.Direct OP 4.Occult Cord  Procedure: Primary Low Transverse Cesarean Section  Surgeon: Jon Rummer, MD    Assistants: Dr. RONAL Sor  Anesthesia: Regional  Anesthesiologist: Jerrye Sharper, MD   Procedure Details  The patient was taken to the operating room secondary to fetal intolerance of labor after the risks, benefits, complications, treatment options, and expected outcomes were discussed with the patient.  The patient concurred with the proposed plan, giving informed consent which was signed and witnessed. The patient was taken to Operating Room C, identified as Jamie Bean and the procedure verified as C-Section Delivery. A Time Out was held and the above information confirmed.  After induction of anesthesia by obtaining a spinal, the patient was prepped and draped in the usual sterile manner. A Pfannenstiel skin incision was made and carried down through the subcutaneous tissue to the underlying layer of fascia.  The fascia was incised bilaterally and extended transversely bilaterally with the Mayo scissors. Kocher clamps were placed on the inferior aspect of the fascial incision and the underlying rectus muscle was separated from the fascia. The same was done on the superior aspect of the fascial incision.  The peritoneum was  identified, entered bluntly and extended manually.  An Alexis self-retaining retractor was placed.  The utero-vesical peritoneal reflection was incised transversely and the bladder flap was bluntly freed from the lower uterine segment. A low transverse uterine incision was made with the scalpel and extended bilaterally with the bandage scissors.  The infant was delivered in vertex position without difficulty.  After the umbilical cord was clamped and cut, the infant was handed to the awaiting pediatricians.  Cord blood was obtained for evaluation.  The placenta was removed intact and appeared to be within normal limits. The uterus was cleared of all clots and debris. The uterine incision was closed with running interlocking sutures of 0 Vicryl and a second imbricating layer was performed as well.   Bilateral tubes and ovaries appeared to be within normal limits.  Good hemostasis was noted.  Copious irrigation was performed until clear.  The peritoneum was repaired with 2-0 chromic via a running suture.  The fascia was reapproximated with a running suture of 0 Vicryl. The subcutaneous tissue was reapproximated with 4 interrupted sutures of 2-0 plain.  The skin was reapproximated with a subcuticular suture of 3-0 monocryl.  Steristrips were applied.  Instrument, sponge, and needle counts were correct prior to abdominal closure and at the conclusion of the case.  The patient was awaiting transfer to the recovery room in good condition.  Findings: Live female infant with Apgars 8 at one minute and 9 at five minutes.  Normal appearing bilateral ovaries and fallopian tubes were noted.  Estimated Blood Loss:  892 ml         Drains: Foley to gravity 50cc (blood tinged upon arrival in OR).  Clearing by the end of the case.         Total IV Fluids: 1000 ml         Specimens to Pathology: None (placenta to mom and dad)         Complications:  None; patient tolerated the procedure well.         Disposition: PACU  - hemodynamically stable.         Condition: stable  Attending Attestation: I performed the procedure.  I was present and scrubbed and the assistant was required due to complexity of anatomy.

## 2023-05-10 LAB — CBC
HCT: 28.7 % — ABNORMAL LOW (ref 36.0–46.0)
Hemoglobin: 9.7 g/dL — ABNORMAL LOW (ref 12.0–15.0)
MCH: 28.1 pg (ref 26.0–34.0)
MCHC: 33.8 g/dL (ref 30.0–36.0)
MCV: 83.2 fL (ref 80.0–100.0)
Platelets: 153 10*3/uL (ref 150–400)
RBC: 3.45 MIL/uL — ABNORMAL LOW (ref 3.87–5.11)
RDW: 13.4 % (ref 11.5–15.5)
WBC: 10.8 10*3/uL — ABNORMAL HIGH (ref 4.0–10.5)
nRBC: 0 % (ref 0.0–0.2)

## 2023-05-10 MED ORDER — POLYSACCHARIDE IRON COMPLEX 150 MG PO CAPS
150.0000 mg | ORAL_CAPSULE | ORAL | Status: DC
Start: 2023-05-10 — End: 2023-05-11
  Administered 2023-05-10: 150 mg via ORAL
  Filled 2023-05-10: qty 1

## 2023-05-10 NOTE — Lactation Note (Signed)
 This note was copied from a baby's chart. Lactation Consultation Note  Patient Name: Boy Melea Prezioso Unijb'd Date: 05/10/2023 Age:40 hours Reason for consult: Follow-up assessment;Term  P3, Baby out of room being circumcised.  Mother states this baby does the best with breastfeeding so far of her three children.  She is experienced. Discussed cluster feeding and make sure baby stay latched with depth to avoid soreness.   Suggest calling for help as needed.   Maternal Data Has patient been taught Hand Expression?: Yes Does the patient have breastfeeding experience prior to this delivery?: Yes  Feeding Mother's Current Feeding Choice: Breast Milk  Interventions Interventions: Education Consult Status Consult Status: Follow-up Date: 05/11/23 Follow-up type: In-patient    Shannon Dines [facility]  RN IBCLC 05/10/2023, 11:40 AM

## 2023-05-10 NOTE — Progress Notes (Signed)
 Subjective: Postpartum Day 1: Cesarean Delivery Patient reports incisional pain, tolerating PO, + flatus, and no problems voiding.    Objective: Vital signs in last 24 hours: Temp:  [97.7 F (36.5 C)-97.9 F (36.6 C)] 97.9 F (36.6 C) (01/02 0451) Pulse Rate:  [65-79] 79 (01/02 0451) Resp:  [16-18] 17 (01/02 0451) BP: (107-120)/(58-77) 113/64 (01/02 0451) SpO2:  [92 %-100 %] 100 % (01/02 0451)  Physical Exam:  General: alert, cooperative, and no distress Lochia: appropriate Uterine Fundus: firm Incision: With honey comb dressing, clean, dry and intact.  DVT Evaluation: No evidence of DVT seen on physical exam. Calf/Ankle edema is present.  Recent Labs    05/08/23 1636 05/10/23 0424  HGB 12.4 9.7*  HCT 36.1 28.7*    Assessment/Plan: 38 y/o G3P3003 POD # 1 Status post Cesarean section. Doing well postoperatively.  Continue current care. Iron  tabs for anemia.  Lovenox  for DVT prophylaxis.   Jamie LELON Foil, MD 05/10/2023, 11:06 AM

## 2023-05-10 NOTE — Progress Notes (Signed)
 MOB was referred for history of anxiety. * Referral screened out by Clinical Social Worker because none of the following criteria appear to apply: ~ History of anxiety during this pregnancy, or of post-partum depression following prior delivery. Per chart review, no concerns noted in prenatal records.  ~ Diagnosis of anxiety within last 3 years. Per chart review, MOB's anxiety dates back to 2019.  OR * MOB's symptoms currently being treated with medication and/or therapy. Please contact the Clinical Social Worker if needs arise, by [facility] request, or if MOB scores greater than 9/yes to question 10 on Edinburgh Postpartum Depression Screen.  Suzen Law, LCSW Clinical Social Worker [facility] Cell#: [phone]

## 2023-05-11 MED ORDER — SENNOSIDES-DOCUSATE SODIUM 8.6-50 MG PO TABS: 2.0000 | ORAL_TABLET | Freq: Every day | ORAL | 0 refills | Status: DC

## 2023-05-11 MED ORDER — IBUPROFEN 600 MG PO TABS: 600.0000 mg | ORAL_TABLET | Freq: Four times a day (QID) | ORAL | 0 refills | Status: DC

## 2023-05-11 MED ORDER — OXYCODONE HCL 5 MG PO TABS: 5.0000 mg | ORAL_TABLET | ORAL | 0 refills | Status: DC | PRN

## 2023-05-11 MED ORDER — ACETAMINOPHEN 500 MG PO TABS: 1000.0000 mg | ORAL_TABLET | Freq: Four times a day (QID) | ORAL | 0 refills | Status: DC

## 2023-05-11 NOTE — Lactation Note (Signed)
 This note was copied from a baby's chart. Lactation Consultation Note  Patient Name: Boy Chrystina Naff Unijb'd Date: 05/11/2023 Age:40 hours Reason for consult: Follow-up assessment;Infant weight loss;Term (8 % weight loss) Exp Breast feeder of 2 others .  Baby latched as LC and orientee entered the room.  Baby latched well and has been consistent with feedings.  LC reviewed the doc flow sheets and correlate with the output.  LC reviewed engorgement prevention and tx and LC resources.  LC reminded mom of the importance of feeding STS until the baby is back to birth weight and not to over dress while feeding to prevent hanging out latched.    Maternal Data Has patient been taught Hand Expression?: Yes  Feeding Mother's Current Feeding Choice: Breast Milk  LATCH Score Latch:  (latched on the right breast with depth)  Audible Swallowing:  (swallows noted)     Comfort (Breast/Nipple):  (per mom comfortable)  Hold (Positioning):  (mom latched the baby)      Lactation Tools Discussed/Used Pump Education: Milk Storage  Interventions Interventions: Breast feeding basics reviewed;Education;LC Services brochure;Ice;CDC Guidelines for Breast Pump Cleaning  Discharge Discharge Education: Engorgement and breast care Pump: Personal;DEBP;Hands Free;Manual  Consult Status Consult Status: Complete Date: 05/11/23    Rollene Jenkins Fiedler 05/11/2023, 12:13 PM

## 2023-05-11 NOTE — Discharge Summary (Signed)
 Postpartum Discharge Summary  Date of Service updated1/3/25     Patient Name: Jamie Bean DOB: 05-Jul-1985 MRN: [id number]  Date of admission: 05/08/2023 Delivery date:05/09/2023 Delivering provider: HENRY SLOUGH Date of discharge: 05/11/2023  Admitting diagnosis: Advanced maternal age in multigravida [O09.529] Intrauterine pregnancy: [redacted]w[redacted]d     Secondary diagnosis:  Principal Problem:   Advanced maternal age in multigravida  Additional problems: Anemia    Discharge diagnosis: Term Pregnancy Delivered and Anemia                                              Post partum procedures: na Augmentation: AROM, Pitocin , and Cytotec Complications: None  Hospital course: Onset of Labor With Unplanned C/S   38 y.o. yo G3P3003 at [redacted]w[redacted]d was admitted in Latent Labor on 05/08/2023. Patient had a labor course significant for fetal distress due to occult cord. The patient went for cesarean section due to Non-Reassuring FHR. Delivery details as follows: Membrane Rupture Time/Date: 9:44 PM,05/08/2023  Delivery Method:C-Section, Low Transverse Operative Delivery:N/A Details of operation can be found in separate operative note. Patient had a postpartum course complicated bynothing.  She is ambulating,tolerating a regular diet, passing flatus, and urinating well.  Patient is discharged home in stable condition 05/11/23.  Newborn Data: Birth date:05/09/2023 Birth time:4:16 AM Gender:Female Living status:Living Apgars:8 ,9  Weight:3856 g  Magnesium Sulfate received: No BMZ received: No Rhophylac:No MMR:No T-DaP: np Flu: No RSV Vaccine received: No Transfusion:No Immunizations administered: Immunization History  Administered Date(s) Administered   Hepatitis B 05/27/2010   Influenza,inj,Quad PF,6+ Mos 03/16/2021, 02/24/2022   PFIZER Comirnaty(Gray Top)Covid-19 Tri-Sucrose Vaccine 06/19/2020   PFIZER(Purple Top)SARS-COV-2 Vaccination 07/07/2019, 07/24/2019, 08/14/2019, 06/19/2020   Pfizer  Covid-19 Vaccine Bivalent Booster 70yrs & up 03/09/2021   Tdap 05/27/2010, 08/31/2017, 06/15/2020    Physical exam  Vitals:   05/10/23 0451 05/10/23 1447 05/10/23 2125 05/11/23 0530  BP: 113/64 131/81 117/72 129/82  Pulse: 79 79 85 84  Resp: 17 17 17 16   Temp: 97.9 F (36.6 C) 98.1 F (36.7 C) 98.3 F (36.8 C)   TempSrc: Oral Oral Oral   SpO2: 100%  100% 100%  Weight:      Height:       General: alert and cooperative Lochia: appropriate Uterine Fundus: firm Incision: Healing well with no significant drainage, Dressing is clean, dry, and intact DVT Evaluation: Negative Homan's sign. Labs: Lab Results  Component Value Date   WBC 10.8 (H) 05/10/2023   HGB 9.7 (L) 05/10/2023   HCT 28.7 (L) 05/10/2023   MCV 83.2 05/10/2023   PLT 153 05/10/2023      Latest Ref Rng & Units 05/08/2023    4:30 PM  CMP  Glucose 70 - 99 mg/dL 887   BUN 6 - 20 mg/dL 8   Creatinine 9.55 - 8.99 mg/dL 9.36   Sodium 864 - 854 mmol/L 134   Potassium 3.5 - 5.1 mmol/L 3.7   Chloride 98 - 111 mmol/L 103   CO2 22 - 32 mmol/L 20   Calcium  8.9 - 10.3 mg/dL 9.1   Total Protein 6.5 - 8.1 g/dL 6.1   Total Bilirubin 0.0 - 1.2 mg/dL 0.4   Alkaline Phos 38 - 126 U/L 76   AST 15 - 41 U/L 14   ALT 0 - 44 U/L 18    Edinburgh Score:    05/10/2023  4:42 PM  Edinburgh Postnatal Depression Scale Screening Tool  I have been able to laugh and see the funny side of things. 0  I have looked forward with enjoyment to things. 0  I have blamed myself unnecessarily when things went wrong. 1  I have been anxious or worried for no good reason. 0  I have felt scared or panicky for no good reason. 0  Things have been getting on top of me. 1  I have been so unhappy that I have had difficulty sleeping. 0  I have felt sad or miserable. 0  I have been so unhappy that I have been crying. 0  The thought of harming myself has occurred to me. 0  Edinburgh Postnatal Depression Scale Total 2      After visit meds:   Allergies as of 05/11/2023       Reactions   Sulfa Antibiotics Hives        Medication List     STOP taking these medications    aspirin EC 81 MG tablet       TAKE these medications    acetaminophen  500 MG tablet Commonly known as: TYLENOL  Take 2 tablets (1,000 mg total) by mouth every 6 (six) hours.   ibuprofen  600 MG tablet Commonly known as: ADVIL  Take 1 tablet (600 mg total) by mouth every 6 (six) hours.   oxyCODONE  5 MG immediate release tablet Commonly known as: Oxy IR/ROXICODONE  Take 1-2 tablets (5-10 mg total) by mouth every 4 (four) hours as needed for moderate pain (pain score 4-6).   prenatal multivitamin Tabs tablet Take 1 tablet by mouth at bedtime.   senna-docusate 8.6-50 MG tablet Commonly known as: Senokot-S Take 2 tablets by mouth daily. Start taking on: May 12, 2023         Discharge home in stable condition Infant Feeding: Breast Infant Disposition:home with mother Discharge instruction: per After Visit Summary and Postpartum booklet. Activity: Advance as tolerated. Pelvic rest for 6 weeks.  Diet: routine diet Anticipated Birth Control: Unsure Postpartum Appointment:6 weeks Additional Postpartum F/U:  na Future Appointments: Future Appointments  Date Time Provider Department Center  05/25/2023  1:20 PM Tobb, Kardie, DO [facility] None   Follow up Visit:  Follow-up Information     [facility]. Schedule an appointment as soon as possible for a visit in 6 week(s).   Specialty: Obstetrics and Gynecology Contact information: [address] [place]  [address] [phone]                    05/11/2023 Ovid DELENA All, MD

## 2023-05-17 ENCOUNTER — Telehealth (HOSPITAL_COMMUNITY): Payer: Self-pay

## 2023-05-17 ENCOUNTER — Inpatient Hospital Stay (HOSPITAL_COMMUNITY)
Admission: AD | Admit: 2023-05-17 | Discharge: 2023-05-17 | Disposition: A | Discharge status: Home / Self Care | Payer: 59 | Attending: Obstetrics and Gynecology | Admitting: Obstetrics and Gynecology

## 2023-05-17 ENCOUNTER — Encounter (HOSPITAL_COMMUNITY): Payer: Self-pay | Admitting: Obstetrics and Gynecology

## 2023-05-17 DIAGNOSIS — D649 Anemia, unspecified: Secondary | ICD-10-CM | POA: Insufficient documentation

## 2023-05-17 DIAGNOSIS — O9089 Other complications of the puerperium, not elsewhere classified: Secondary | ICD-10-CM | POA: Diagnosis not present

## 2023-05-17 DIAGNOSIS — O165 Unspecified maternal hypertension, complicating the puerperium: Secondary | ICD-10-CM | POA: Insufficient documentation

## 2023-05-17 DIAGNOSIS — Z8759 Personal history of other complications of pregnancy, childbirth and the puerperium: Secondary | ICD-10-CM | POA: Diagnosis not present

## 2023-05-17 DIAGNOSIS — R519 Headache, unspecified: Secondary | ICD-10-CM | POA: Diagnosis not present

## 2023-05-17 LAB — COMPREHENSIVE METABOLIC PANEL
ALT: 14 U/L (ref 0–44)
AST: 14 U/L — ABNORMAL LOW (ref 15–41)
Albumin: 3 g/dL — ABNORMAL LOW (ref 3.5–5.0)
Alkaline Phosphatase: 51 U/L (ref 38–126)
Anion gap: 9 (ref 5–15)
BUN: 19 mg/dL (ref 6–20)
CO2: 22 mmol/L (ref 22–32)
Calcium: 8.8 mg/dL — ABNORMAL LOW (ref 8.9–10.3)
Chloride: 106 mmol/L (ref 98–111)
Creatinine, Ser: 0.8 mg/dL (ref 0.44–1.00)
GFR, Estimated: >60 mL/min (ref >60)
Glucose, Bld: 84 mg/dL (ref 70–99)
Potassium: 4 mmol/L (ref 3.5–5.1)
Sodium: 137 mmol/L (ref 135–145)
Total Bilirubin: 0.5 mg/dL (ref 0.0–1.2)
Total Protein: 6.6 g/dL (ref 6.5–8.1)

## 2023-05-17 LAB — CBC
HCT: 36.4 % (ref 36.0–46.0)
Hemoglobin: 11.9 g/dL — ABNORMAL LOW (ref 12.0–15.0)
MCH: 27.4 pg (ref 26.0–34.0)
MCHC: 32.7 g/dL (ref 30.0–36.0)
MCV: 83.7 fL (ref 80.0–100.0)
Platelets: 305 10*3/uL (ref 150–400)
RBC: 4.35 MIL/uL (ref 3.87–5.11)
RDW: 13.1 % (ref 11.5–15.5)
WBC: 8.2 10*3/uL (ref 4.0–10.5)
nRBC: 0 % (ref 0.0–0.2)

## 2023-05-17 MED ORDER — NIFEDIPINE ER OSMOTIC RELEASE 30 MG PO TB24: 30.0000 mg | ORAL_TABLET | Freq: Every day | ORAL | 0 refills | Status: DC

## 2023-05-17 MED ORDER — FUROSEMIDE 20 MG PO TABS: 20.0000 mg | ORAL_TABLET | Freq: Every day | ORAL | 0 refills | Status: DC

## 2023-05-17 MED ORDER — ACETAMINOPHEN-CAFFEINE 500-65 MG PO TABS
2.0000 | ORAL_TABLET | Freq: Once | ORAL | Status: AC
  Administered 2023-05-17: 2 via ORAL
  Filled 2023-05-17: qty 2

## 2023-05-17 NOTE — Discharge Instructions (Signed)
 You should be seen in one week at [facility] for a BP check.

## 2023-05-17 NOTE — MAU Note (Signed)
.  Jamie Bean is a 38 y.o. at 8 days post partum here in MAU reporting: Coming from home for elevated BP's as well as a HA since yesterday. She reports her HA is left sided. She reports her BP's at home were in the 140's-160's/80-90's. She denies visual disturbances and RUQ/epigastric pain. She reports her swelling has started to decrease since delivery. She reports her BP's have elevated in previous post partum periods with her other children. Denies chest pain.  Last took Tylenol  and Ibuprofen  yesterday. Has tried to wean herself today.   Patient had a C/S due to fetal distress due to occult cord with NRFHR per note in epic.  Onset of complaint: Yesterday  Vitals:   05/17/23 1750  BP: (!) 149/91  Pulse: 74  Resp: 16  Temp: 98 F (36.7 C)  SpO2: 100%     Lab orders placed from triage: none

## 2023-05-17 NOTE — MAU Provider Note (Addendum)
 History     CSN: [id number]  Arrival date and time: 05/17/23 1733   Event Date/Time   First Provider Initiated Contact with Patient 05/17/23 1822      Chief Complaint  Patient presents with   Hypertension   Headache   HPI Patient presenting for evaluation for postpartum hypertension.  Delivered on 1/1 via emergency C-section.  Denies any issues with blood pressure during the pregnancy.  Reports history of postpartum hypertension in the past.  Was previously started on Procardia .  Reports decreased appetite over the past few days and decreased p.o. intake.  She has had headache which she reports has been for the last day or so and she took Tylenol  and ibuprofen  yesterday without much improvement.  Reports that pain is right over her left temple.  Denies any spotty vision, right upper quadrant pain, worsening swelling in her lower extremities.  OB History     Gravida  3   Para  3   Term  3   Preterm  0   AB  0   Living  3      SAB  0   IAB  0   Ectopic  0   Multiple  0   Live Births  3           Past Medical History:  Diagnosis Date   Anemia    Anxiety    Arthritis    Constipation    Female infertility    Gestational HTN    Joint pain    Knee pain    Palpitations    Postpartum hypertension    UTI (urinary tract infection)    Vitamin D  deficiency     Past Surgical History:  Procedure Laterality Date   CESAREAN SECTION N/A 05/09/2023   Procedure: CESAREAN SECTION;  Surgeon: Henry Slough, MD;  Location: [facility] LD ORS;  Service: Obstetrics;  Laterality: N/A;   INCISE AND DRAIN ABCESS     1994    Family History  Problem Relation Age of Onset   Renal cancer Maternal Grandmother 35   Alcohol abuse Maternal Grandmother    Hyperlipidemia Maternal Grandmother    Hypertension Maternal Grandmother    Arthritis Father    Obesity Father    Alcohol abuse Maternal Uncle    Mental illness Maternal Uncle    Mental illness Paternal Aunt    Diabetes Paternal  Aunt    Hyperlipidemia Maternal Grandfather    Hypertension Maternal Grandfather    Stroke Paternal Grandmother    Diabetes Paternal Grandmother    Cancer Paternal Grandfather        lung   Diabetes Brother        dm2 dx'ed 88    Colon cancer Neg Hx    Breast cancer Neg Hx     Social History   Tobacco Use   Smoking status: Never   Smokeless tobacco: Never  Vaping Use   Vaping status: Never Used  Substance Use Topics   Alcohol use: Not Currently    Comment: occasional   Drug use: No    Allergies:  Allergies  Allergen Reactions   Sulfa Antibiotics Hives    Medications Prior to Admission  Medication Sig Dispense Refill Last Dose/Taking   acetaminophen  (TYLENOL ) 500 MG tablet Take 2 tablets (1,000 mg total) by mouth every 6 (six) hours. 30 tablet 0 05/16/2023   ibuprofen  (ADVIL ) 600 MG tablet Take 1 tablet (600 mg total) by mouth every 6 (six) hours. 30 tablet 0 05/16/2023  oxyCODONE  (OXY IR/ROXICODONE ) 5 MG immediate release tablet Take 1-2 tablets (5-10 mg total) by mouth every 4 (four) hours as needed for moderate pain (pain score 4-6). 30 tablet 0 Past Month   Prenatal Vit-Fe Fumarate-FA (PRENATAL MULTIVITAMIN) TABS tablet Take 1 tablet by mouth at bedtime.   05/17/2023   senna-docusate (SENOKOT-S) 8.6-50 MG tablet Take 2 tablets by mouth daily. 60 tablet 0     Review of Systems  Constitutional:  Negative for chills and fever.  HENT:  Negative for congestion and rhinorrhea.   Eyes:  Negative for visual disturbance.  Respiratory:  Negative for shortness of breath.   Cardiovascular:  Negative for chest pain.  Gastrointestinal:  Negative for abdominal pain, diarrhea, nausea and vomiting.  Genitourinary:  Negative for vaginal discharge.  Neurological:  Positive for headaches.   Physical Exam   Blood pressure (!) 139/90, pulse 83, temperature 98 F (36.7 C), temperature source Oral, resp. rate 16, SpO2 100%, currently breastfeeding.  Physical Exam Vitals reviewed.   Constitutional:      Appearance: She is well-developed.  HENT:     Head: Normocephalic and atraumatic.  Eyes:     Extraocular Movements: Extraocular movements intact.  Cardiovascular:     Rate and Rhythm: Normal rate and regular rhythm.  Pulmonary:     Effort: Pulmonary effort is normal.  Abdominal:     Palpations: Abdomen is soft.     Tenderness: There is no abdominal tenderness.     Comments: Cesarean incision healing well  Musculoskeletal:        General: No swelling.     Cervical back: Normal range of motion.  Skin:    General: Skin is warm.     Capillary Refill: Capillary refill takes less than 2 seconds.  Neurological:     Mental Status: She is alert. Mental status is at baseline.     Cranial Nerves: No cranial nerve deficit.  Psychiatric:        Mood and Affect: Mood normal.        Speech: Speech normal.        Behavior: Behavior normal.     MAU Course  Procedures  MDM CBC CMP Excedrin tension   Assessment and Plan  Jamie Bean is a 38 yo PPD#9 after cesarean delivery presenting for elevated blood pressures  Postpartum hypertension Patient presenting for elevated blood pressures at home.  Blood pressures at home 160/90s.  Reports headache starting yesterday which did not respond to Tylenol  or ibuprofen .  Has not had any Tylenol  today.  No right upper quadrant pain, swelling in lower extremities, spotty vision.  CMP within normal limits, CBC showing improving anemia after cesarean section.  Patient given Excedrin tension headache and will monitor for improvement.  If improved she can likely go home but will likely need blood pressure medications.  Would start 30 mg Procardia .  If no improvement in headache will likely need admission.  Signed out to oncoming provider who will follow-up and treat as needed.   John V Cresenzo 05/17/2023, 7:49 PM   Care assumed from Dr. Cresenzo at 2000.   Headache improved following Excedrin Tension HA. Labs benign. Consulted  Dr. [name] to discuss recommendations for care: Procardia  30mg  XL daily, 5 days of Lasix , 20mg , and a blood pressure check in 1 weeks. Dr. [name] agrees with plan of care. Episode of care routed to [facility].   Discharged in stable condition with instructions for follow up.  May return to MAU PRN for further or worsening  concerns.   Camie Rote, MSN, CNM, RNC-OB Certified Nurse Midwife, [facility] 05/17/2023 9:08 PM

## 2023-05-17 NOTE — Telephone Encounter (Signed)
 05/17/2023 1357  Name: Katrinna Travieso MRN: [id number] DOB: [birth year]  Reason for Call:  Transition of Care Hospital Discharge Call  Contact Status: Patient Contact Status: Message  Language assistant needed:          Follow-Up Questions:    Van Postnatal Depression Scale:  In the Past 7 Days:    PHQ2-9 Depression Scale:     Discharge Follow-up:    Post-discharge interventions: NA  Signature  Rosaline Deretha PEAK

## 2023-05-19 ENCOUNTER — Other Ambulatory Visit: Payer: Self-pay | Admitting: Certified Nurse Midwife

## 2023-05-22 NOTE — Telephone Encounter (Signed)
 Patient sent with 5 day Lasix  prescription for postpartum fluid retention in the setting of hypertension. Patient does not need extended prescription.   Camie Rote, MSN, CNM, RNC-OB Certified Nurse Midwife, [facility] 05/22/2023 2:08 PM

## 2023-05-25 ENCOUNTER — Ambulatory Visit: Payer: 59 | Admitting: Cardiology

## 2023-07-13 ENCOUNTER — Encounter: Payer: Self-pay | Admitting: Cardiology

## 2023-07-13 ENCOUNTER — Ambulatory Visit: Payer: 59 | Admitting: Cardiology

## 2023-07-13 VITALS — BP 120/80 | HR 64 | Ht 69.0 in | Wt 260.3 lb

## 2023-07-13 DIAGNOSIS — I10 Essential (primary) hypertension: Secondary | ICD-10-CM | POA: Diagnosis not present

## 2023-07-13 DIAGNOSIS — R000 Tachycardia, unspecified: Secondary | ICD-10-CM

## 2023-07-13 DIAGNOSIS — Z7689 Persons encountering health services in other specified circumstances: Secondary | ICD-10-CM

## 2023-07-13 NOTE — Patient Instructions (Signed)
 Medication Instructions:  Your physician recommends that you continue on your current medications as directed. Please refer to the Current Medication list given to you today.  *If you need a refill on your cardiac medications before your next appointment, please call your pharmacy*    Follow-Up: At [facility], you and your health needs are our priority.  As part of our continuing mission to provide you with exceptional heart care, we have created designated Provider Care Teams.  These Care Teams include your primary Cardiologist (physician) and Advanced Practice Providers (APPs -  Physician Assistants and Nurse Practitioners) who all work together to provide you with the care you need, when you need it.   Your next appointment:   6 month(s)  Provider:   Thomasene Ripple, DO

## 2023-07-14 NOTE — Progress Notes (Signed)
 Cardio-Obstetrics Clinic  New Evaluation  Date:  07/14/2023   ID:  Aune, Adami 1986/02/01, MRN [id number]  PCP:  Allegra Grana, FNP   [facility] Providers Cardiologist:  None  Electrophysiologist:  None       Referring MD: Nigel Bridgeman, CNM   Chief Complaint: " I had palpitations during the pregnancy"  History of Present Illness:    Jamie Bean is a 38 y.o. female [G3P3003] who is being seen today for the evaluation of hypertension  at the request of Nigel Bridgeman, CNM.   Medica history of gestational hypertension and a family history of sudden cardiac death, was referred by her primary care provider due to heart palpitations. The palpitations were particularly noticeable in the evenings after eating and when lying down. The patient had previously seen a cardiologist in 2019 as a preventative measure following the sudden death of her uncle from cardiac arrest. At that time, all cardiac evaluations were normal.  The patient has had three pregnancies, the most recent of which was complicated by hypertension. The hypertension resolved spontaneously postpartum, and the patient did not require antihypertensive medication. The patient reports that her heart palpitations have also resolved postpartum.  The patient is currently breastfeeding and is interested in maintaining her cardiovascular health, particularly given her family history and her experiences with gestational hypertension. She is also concerned about her future risk of developing hypertension, particularly as she approaches menopause.   Prior CV Studies Reviewed: The following studies were reviewed today:   Past Medical History:  Diagnosis Date   Anemia    Anxiety    Arthritis    Constipation    Female infertility    Gestational HTN    Joint pain    Knee pain    Palpitations    Postpartum hypertension    UTI (urinary tract infection)    Vitamin D deficiency     Past Surgical History:   Procedure Laterality Date   CESAREAN SECTION N/A 05/09/2023   Procedure: CESAREAN SECTION;  Surgeon: Osborn Coho, MD;  Location: [facility] LD ORS;  Service: Obstetrics;  Laterality: N/A;   INCISE AND DRAIN ABCESS     1994      OB History     Gravida  3   Para  3   Term  3   Preterm  0   AB  0   Living  3      SAB  0   IAB  0   Ectopic  0   Multiple  0   Live Births  3               Current Medications: Current Meds  Medication Sig   Prenatal Vit-Fe Fumarate-FA (PRENATAL MULTIVITAMIN) TABS tablet Take 1 tablet by mouth at bedtime.     Allergies:   Sulfa antibiotics   Social History   Socioeconomic History   Marital status: Married    Spouse name: Doug   Number of children: Not on file   Years of education: Not on file   Highest education level: Not on file  Occupational History   Occupation: Psychologist    Comment: [employer]  Tobacco Use   Smoking status: Never   Smokeless tobacco: Never  Vaping Use   Vaping status: Never Used  Substance and Sexual Activity   Alcohol use: Not Currently    Comment: occasional   Drug use: No   Sexual activity: Yes    Partners: Male  Other Topics  Concern   Not on file  Social History Narrative   Lives in [place]      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         1 month old daughter,emerie      House visits   15 son   10 daugher   26 daughter      One son- with her during summer      Married      Social Drivers of Corporate investment banker Strain: Not on file  Food Insecurity: No Food Insecurity (05/08/2023)   Hunger Vital Sign    Worried About Running Out of Food in the Last Year: Never true    Ran Out of Food in the Last Year: Never true  Transportation Needs: No Transportation Needs (05/08/2023)   PRAPARE - Administrator, Civil Service (Medical): No    Lack of Transportation (Non-Medical): No  Physical Activity: Not on file  Stress: Not on file  Social  Connections: Not on file      Family History  Problem Relation Age of Onset   Renal cancer Maternal Grandmother 11   Alcohol abuse Maternal Grandmother    Hyperlipidemia Maternal Grandmother    Hypertension Maternal Grandmother    Arthritis Father    Obesity Father    Alcohol abuse Maternal Uncle    Mental illness Maternal Uncle    Mental illness Paternal Aunt    Diabetes Paternal Aunt    Hyperlipidemia Maternal Grandfather    Hypertension Maternal Grandfather    Stroke Paternal Grandmother    Diabetes Paternal Grandmother    Cancer Paternal Grandfather        lung   Diabetes Brother        dm2 dx'ed 57    Colon cancer Neg Hx    Breast cancer Neg Hx       ROS:   Please see the history of present illness.     All other systems reviewed and are negative.   Labs/EKG Reviewed:    EKG:  None today    Recent Labs: 07/26/2022: TSH 0.71 05/17/2023: ALT 14; BUN 19; Creatinine, Ser 0.80; Hemoglobin 11.9; Platelets 305; Potassium 4.0; Sodium 137   Recent Lipid Panel Lab Results  Component Value Date/Time   CHOL 200 07/26/2022 03:40 PM   CHOL 245 (H) 04/06/2020 08:37 AM   TRIG 137.0 07/26/2022 03:40 PM   HDL 51.70 07/26/2022 03:40 PM   HDL 82 04/06/2020 08:37 AM   CHOLHDL 4 07/26/2022 03:40 PM   LDLCALC 121 (H) 07/26/2022 03:40 PM   LDLCALC 143 (H) 04/06/2020 08:37 AM   LDLCALC 118 (H) 09/05/2019 02:53 PM    Physical Exam:    VS:  BP 120/80 (BP Location: Left Arm, Patient Position: Sitting, Cuff Size: Normal)   Pulse 64   Ht 5\' 9"  (1.753 m)   Wt 260 lb 4.8 oz (118.1 kg)   BMI 38.44 kg/m     Wt Readings from Last 3 Encounters:  07/13/23 260 lb 4.8 oz (118.1 kg)  05/08/23 273 lb 8 oz (124.1 kg)  07/27/22 253 lb 6.4 oz (114.9 kg)     GEN:  Well nourished, well developed in no acute distress HEENT: Normal NECK: No JVD; No carotid bruits LYMPHATICS: No lymphadenopathy CARDIAC: RRR, no murmurs, rubs, gallops RESPIRATORY:  Clear to auscultation without rales,  wheezing or rhonchi  ABDOMEN: Soft, non-tender, non-distended MUSCULOSKELETAL:  No edema; No deformity  SKIN: Warm and dry NEUROLOGIC:  Alert  and oriented x 3 PSYCHIATRIC:  Normal affect    Risk Assessment/Risk Calculators:                 ASSESSMENT & PLAN:    Palpitations Reported palpitations during pregnancy, but symptoms have resolved postpartum. EKG performed during visit was normal. -Monitor for recurrence of symptoms. If palpitations return, patient to contact office for potential ambulatory EKG monitoring.  Hypertension during pregnancy Blood pressure normalized postpartum. Discussed future risk of developing hypertension, especially around menopause. -Encouraged lifestyle modifications including regular exercise and sodium restriction to mitigate future risk.  -Plan for coronary CT scan and lipid panel 6 months postpartum (around August 2025) to assess for coronary artery disease and dyslipidemia.  General Health Maintenance -Encouraged continuation of breastfeeding. -Scheduled follow-up appointment in 6 months (around August 2025).   Patient Instructions  Medication Instructions:  Your physician recommends that you continue on your current medications as directed. Please refer to the Current Medication list given to you today.  *If you need a refill on your cardiac medications before your next appointment, please call your pharmacy*  Follow-Up: At [facility], you and your health needs are our priority.  As part of our continuing mission to provide you with exceptional heart care, we have created designated Provider Care Teams.  These Care Teams include your primary Cardiologist (physician) and Advanced Practice Providers (APPs -  Physician Assistants and Nurse Practitioners) who all work together to provide you with the care you need, when you need it.   Your next appointment:   6 month(s)  Provider:   Thomasene Ripple, DO    Dispo:  No follow-ups on  file.   Medication Adjustments/Labs and Tests Ordered: Current medicines are reviewed at length with the patient today.  Concerns regarding medicines are outlined above.  Tests Ordered: Orders Placed This Encounter  Procedures   EKG 12-Lead   Medication Changes: No orders of the defined types were placed in this encounter.

## 2023-08-06 ENCOUNTER — Encounter: Payer: Self-pay | Admitting: Family

## 2023-08-06 ENCOUNTER — Ambulatory Visit (INDEPENDENT_AMBULATORY_CARE_PROVIDER_SITE_OTHER): Admitting: Family

## 2023-08-06 ENCOUNTER — Ambulatory Visit: Admitting: Family

## 2023-08-06 VITALS — BP 143/82 | HR 76 | Temp 98.0°F | Wt 254.0 lb

## 2023-08-06 DIAGNOSIS — Z0000 Encounter for general adult medical examination without abnormal findings: Secondary | ICD-10-CM | POA: Insufficient documentation

## 2023-08-06 DIAGNOSIS — M25561 Pain in right knee: Secondary | ICD-10-CM

## 2023-08-06 DIAGNOSIS — I10 Essential (primary) hypertension: Secondary | ICD-10-CM | POA: Diagnosis not present

## 2023-08-06 DIAGNOSIS — Z0001 Encounter for general adult medical examination with abnormal findings: Secondary | ICD-10-CM

## 2023-08-06 DIAGNOSIS — G8929 Other chronic pain: Secondary | ICD-10-CM | POA: Diagnosis not present

## 2023-08-06 LAB — CBC WITH DIFFERENTIAL/PLATELET
Basophils Absolute: 0 10*3/uL (ref 0.0–0.1)
Basophils Relative: 0.8 % (ref 0.0–3.0)
Eosinophils Absolute: 0.2 10*3/uL (ref 0.0–0.7)
Eosinophils Relative: 2.5 % (ref 0.0–5.0)
HCT: 37.6 % (ref 36.0–46.0)
Hemoglobin: 12.4 g/dL (ref 12.0–15.0)
Lymphocytes Relative: 31.7 % (ref 12.0–46.0)
Lymphs Abs: 1.9 10*3/uL (ref 0.7–4.0)
MCHC: 32.9 g/dL (ref 30.0–36.0)
MCV: 83.3 fl (ref 78.0–100.0)
Monocytes Absolute: 0.5 10*3/uL (ref 0.1–1.0)
Monocytes Relative: 8.2 % (ref 3.0–12.0)
Neutro Abs: 3.5 10*3/uL (ref 1.4–7.7)
Neutrophils Relative %: 56.8 % (ref 43.0–77.0)
Platelets: 230 10*3/uL (ref 150.0–400.0)
RBC: 4.51 Mil/uL (ref 3.87–5.11)
RDW: 13.6 % (ref 11.5–15.5)
WBC: 6.1 10*3/uL (ref 4.0–10.5)

## 2023-08-06 LAB — LIPID PANEL
Cholesterol: 183 mg/dL (ref 0–200)
HDL: 51.2 mg/dL (ref >39.00)
LDL Cholesterol: 124 mg/dL — ABNORMAL HIGH (ref 0–99)
NonHDL: 132.12
Total CHOL/HDL Ratio: 4
Triglycerides: 42 mg/dL (ref 0.0–149.0)
VLDL: 8.4 mg/dL (ref 0.0–40.0)

## 2023-08-06 LAB — COMPREHENSIVE METABOLIC PANEL WITH GFR
ALT: 18 U/L (ref 0–35)
AST: 19 U/L (ref 0–37)
Albumin: 4.3 g/dL (ref 3.5–5.2)
Alkaline Phosphatase: 44 U/L (ref 39–117)
BUN: 18 mg/dL (ref 6–23)
CO2: 27 meq/L (ref 19–32)
Calcium: 9.2 mg/dL (ref 8.4–10.5)
Chloride: 107 meq/L (ref 96–112)
Creatinine, Ser: 0.92 mg/dL (ref 0.40–1.20)
GFR: 78.41 mL/min (ref >60.00)
Glucose, Bld: 98 mg/dL (ref 70–99)
Potassium: 4.1 meq/L (ref 3.5–5.1)
Sodium: 140 meq/L (ref 135–145)
Total Bilirubin: 0.6 mg/dL (ref 0.2–1.2)
Total Protein: 7.4 g/dL (ref 6.0–8.3)

## 2023-08-06 LAB — VITAMIN D 25 HYDROXY (VIT D DEFICIENCY, FRACTURES): VITD: 31.89 ng/mL (ref 30.00–100.00)

## 2023-08-06 LAB — TSH: TSH: 0.63 u[IU]/mL (ref 0.35–5.50)

## 2023-08-06 LAB — HEMOGLOBIN A1C: Hgb A1c MFr Bld: 5.6 % (ref 4.6–6.5)

## 2023-08-06 MED ORDER — NIFEDIPINE ER OSMOTIC RELEASE 30 MG PO TB24: 30.0000 mg | ORAL_TABLET | Freq: Every day | ORAL | 1 refills | Status: DC

## 2023-08-06 NOTE — Patient Instructions (Addendum)
 Congratulations!  Let me know if you would like a referral for physical therapy  Ice after walking.  You may wear an Ace wrap or neoprene sleeve on her knees for added support.  I refilled Procardia for blood pressure.  Please resume this medication if blood pressures persistently over 130/80 and please let me know  Managing Your Hypertension Hypertension, also called high blood pressure, is when the force of the blood pressing against the walls of the arteries is too strong. Arteries are blood vessels that carry blood from your heart throughout your body. Hypertension forces the heart to work harder to pump blood and may cause the arteries to become narrow or stiff. Understanding blood pressure readings A blood pressure reading includes a higher number over a lower number: The first, or top, number is called the systolic pressure. It is a measure of the pressure in your arteries as your heart beats. The second, or bottom number, is called the diastolic pressure. It is a measure of the pressure in your arteries as the heart relaxes. For most people, a normal blood pressure is below 120/80. Your personal target blood pressure may vary depending on your medical conditions, your age, and other factors. Blood pressure is classified into four stages. Based on your blood pressure reading, your health care provider may use the following stages to determine what type of treatment you need, if any. Systolic pressure and diastolic pressure are measured in a unit called millimeters of mercury (mmHg). Normal Systolic pressure: below 120. Diastolic pressure: below 80. Elevated Systolic pressure: 120-129. Diastolic pressure: below 80. Hypertension stage 1 Systolic pressure: 130-139. Diastolic pressure: 80-89. Hypertension stage 2 Systolic pressure: 140 or above. Diastolic pressure: 90 or above. How can this condition affect me? Managing your hypertension is very important. Over time, hypertension can  damage the arteries and decrease blood flow to parts of the body, including the brain, heart, and kidneys. Having untreated or uncontrolled hypertension can lead to: A heart attack. A stroke. A weakened blood vessel (aneurysm). Heart failure. Kidney damage. Eye damage. Memory and concentration problems. Vascular dementia. What actions can I take to manage this condition? Hypertension can be managed by making lifestyle changes and possibly by taking medicines. Your health care provider will help you make a plan to bring your blood pressure within a normal range. You may be referred for counseling on a healthy diet and physical activity. Nutrition  Eat a diet that is high in fiber and potassium, and low in salt (sodium), added sugar, and fat. An example eating plan is called the DASH diet. DASH stands for Dietary Approaches to Stop Hypertension. To eat this way: Eat plenty of fresh fruits and vegetables. Try to fill one-half of your plate at each meal with fruits and vegetables. Eat whole grains, such as whole-wheat pasta, brown rice, or whole-grain bread. Fill about one-fourth of your plate with whole grains. Eat low-fat dairy products. Avoid fatty cuts of meat, processed or cured meats, and poultry with skin. Fill about one-fourth of your plate with lean proteins such as fish, chicken without skin, beans, eggs, and tofu. Avoid pre-made and processed foods. These tend to be higher in sodium, added sugar, and fat. Reduce your daily sodium intake. Many people with hypertension should eat less than 1,500 mg of sodium a day. Lifestyle  Work with your health care provider to maintain a healthy body weight or to lose weight. Ask what an ideal weight is for you. Get at least 30 minutes of  exercise that causes your heart to beat faster (aerobic exercise) most days of the week. Activities may include walking, swimming, or biking. Include exercise to strengthen your muscles (resistance exercise), such  as weight lifting, as part of your weekly exercise routine. Try to do these types of exercises for 30 minutes at least 3 days a week. Do not use any products that contain nicotine or tobacco. These products include cigarettes, chewing tobacco, and vaping devices, such as e-cigarettes. If you need help quitting, ask your health care provider. Control any long-term (chronic) conditions you have, such as high cholesterol or diabetes. Identify your sources of stress and find ways to manage stress. This may include meditation, deep breathing, or making time for fun activities. Alcohol use Do not drink alcohol if: Your health care provider tells you not to drink. You are pregnant, may be pregnant, or are planning to become pregnant. If you drink alcohol: Limit how much you have to: 0-1 drink a day for women. 0-2 drinks a day for men. Know how much alcohol is in your drink. In the [place] one drink equals one 12 oz bottle of beer (355 mL), one 5 oz glass of wine (148 mL), or one 1 oz glass of hard liquor (44 mL). Medicines Your health care provider may prescribe medicine if lifestyle changes are not enough to get your blood pressure under control and if: Your systolic blood pressure is 130 or higher. Your diastolic blood pressure is 80 or higher. Take medicines only as told by your health care provider. Follow the directions carefully. Blood pressure medicines must be taken as told by your health care provider. The medicine does not work as well when you skip doses. Skipping doses also puts you at risk for problems. Monitoring Before you monitor your blood pressure: Do not smoke, drink caffeinated beverages, or exercise within 30 minutes before taking a measurement. Use the bathroom and empty your bladder (urinate). Sit quietly for at least 5 minutes before taking measurements. Monitor your blood pressure at home as told by your health care provider. To do this: Sit with your back straight and  supported. Place your feet flat on the floor. Do not cross your legs. Support your arm on a flat surface, such as a table. Make sure your upper arm is at heart level. Each time you measure, take two or three readings one minute apart and record the results. You may also need to have your blood pressure checked regularly by your health care provider. General information Talk with your health care provider about your diet, exercise habits, and other lifestyle factors that may be contributing to hypertension. Review all the medicines you take with your health care provider because there may be side effects or interactions. Keep all follow-up visits. Your health care provider can help you create and adjust your plan for managing your high blood pressure. Where to find more information National Heart, Lung, and Blood Institute: [employer].is American Heart Association: www.heart.org Contact a health care provider if: You think you are having a reaction to medicines you have taken. You have repeated (recurrent) headaches. You feel dizzy. You have swelling in your ankles. You have trouble with your vision. Get help right away if: You develop a severe headache or confusion. You have unusual weakness or numbness, or you feel faint. You have severe pain in your chest or abdomen. You vomit repeatedly. You have trouble breathing. These symptoms may be an emergency. Get help right away. Call 911. Do not  wait to see if the symptoms will go away. Do not drive yourself to the hospital. Summary Hypertension is when the force of blood pumping through your arteries is too strong. If this condition is not controlled, it may put you at risk for serious complications. Your personal target blood pressure may vary depending on your medical conditions, your age, and other factors. For most people, a normal blood pressure is less than 120/80. Hypertension is managed by lifestyle changes, medicines, or  both. Lifestyle changes to help manage hypertension include losing weight, eating a healthy, low-sodium diet, exercising more, stopping smoking, and limiting alcohol. This information is not intended to replace advice given to you by your health care provider. Make sure you discuss any questions you have with your health care provider. Document Revised: 01/06/2021 Document Reviewed: 01/06/2021 Elsevier Patient Education  2024 [employer] Inc.  Exercises for Chronic Knee Pain Chronic knee pain is pain that lasts longer than 3 months. For most people with chronic knee pain, exercise and weight loss is an important part of treatment. Your health care provider may want you to focus on: Making the muscles that support your knee stronger. This can take pressure off your knee and reduce pain. Preventing knee stiffness. How far you can move your knee, keeping it there or making it farther. Losing weight (if this applies) to take pressure off your knee, lower your risk for injury, and make it easier for you to exercise. Your provider will help you make an exercise program that fits your needs and physical abilities. Below are simple, low-impact exercises you can do at home. Ask your provider or physical therapist how often you should do your exercise program and how many times to repeat each exercise. General safety tips  Get your provider's approval before doing any exercises. Start slowly and stop any time you feel pain. Do not exercise if your knee pain is flaring up. Warm up first. Stretching a cold muscle can cause an injury. Do 5-10 minutes of easy movement or light stretching before beginning your exercises. Do 5-10 minutes of low-impact activity (like walking or cycling) before starting strengthening exercises. Contact your provider any time you have pain during or after exercising. Exercise can cause discomfort but should not be painful. It is normal to be a little stiff or sore after  exercising. Stretching and range-of-motion exercises Front thigh stretch  Stand up straight and support your body by holding on to a chair or resting one hand on a wall. With your legs straight and close together, bend one knee to lift your heel up toward your butt. Using one hand for support, grab your ankle with your free hand. Pull your foot up closer toward your butt to feel the stretch in front of your thigh. Hold the stretch for 30 seconds. Repeat __________ times. Complete this exercise __________ times a day. Back thigh stretch  Sit on the floor with your back straight and your legs out straight in front of you. Place the palms of your hands on the floor and slide them toward your feet as you bend at the hip. Try to touch your nose to your knees and feel the stretch in the back of your thighs. Hold for 30 seconds. Repeat __________ times. Complete this exercise __________ times a day. Calf stretch  Stand facing a wall. Place the palms of your hands flat against the wall, arms extended, and lean slightly against the wall. Get into a lunge position with one leg bent  at the knee and the other leg stretched out straight behind you. Keep both feet facing the wall and increase the bend in your knee while keeping the heel of the other leg flat on the ground. You should feel the stretch in your calf. Hold for 30 seconds. Repeat __________ times. Complete this exercise __________ times a day. Strengthening exercises Straight leg lift  Lie on your back with one knee bent and the other leg out straight. Slowly lift the straight leg without bending the knee. Lift until your foot is about 12 inches (30 cm) off the floor. Hold for 3-5 seconds and slowly lower your leg. Repeat __________ times. Complete this exercise __________ times a day. Single leg dip  Stand between two chairs and put both hands on the backs of the chairs for support. Extend one leg out straight with your body weight  resting on the heel of the standing leg. Slowly bend your standing knee to dip your body to the level that is comfortable for you. Hold for 3-5 seconds. Repeat __________ times. Complete this exercise __________ times a day. Hamstring curls  Stand straight, knees close together, facing the back of a chair. Hold on to the back of a chair with both hands. Keep one leg straight. Bend the other knee while bringing the heel up toward the butt until the knee is bent at a 90-degree angle (right angle). Hold for 3-5 seconds. Repeat __________ times. Complete this exercise __________ times a day. Wall squat  Stand straight with your back, hips, and head against a wall. Step forward one foot at a time with your back still against the wall. Your feet should be 2 feet (61 cm) from the wall at shoulder width. Keeping your back, hips, and head against the wall, slide down the wall to as close to a sitting position as you can get. Hold for 5-10 seconds, then slowly slide back up. Repeat __________ times. Complete this exercise __________ times a day. Step-ups  Stand in front of a sturdy platform or stool that is about 6 inches (15 cm) high. Slowly step up with your left / right foot, keeping your knee in line with your hip and foot. Do not let your knee bend so far that you cannot see your toes. Hold on to a chair for balance, but do not use it for support. Slowly unlock your knee and lower yourself to the starting position. Repeat __________ times. Complete this exercise __________ times a day. Contact a health care provider if: Your exercises cause pain. Your pain is worse after you exercise. Your pain prevents you from doing your exercises. This information is not intended to replace advice given to you by your health care provider. Make sure you discuss any questions you have with your health care provider. Document Revised: 05/09/2022 Document Reviewed: 05/09/2022 Elsevier Patient Education   2024 [employer].

## 2023-08-06 NOTE — Progress Notes (Unsigned)
   Assessment & Plan:  Hypertension, unspecified type     Return precautions given.   Risks, benefits, and alternatives of the medications and treatment plan prescribed today were discussed, and patient expressed understanding.   Education regarding symptom management and diagnosis given to patient on AVS either electronically or printed.  No follow-ups on file.  Rennie Plowman, FNP  Subjective:    Patient ID: Jamie Bean, female    DOB: 08/17/1985, 38 y.o.   MRN: [id number]  CC: Jamie Bean is a 38 y.o. female who presents today for physical exam.    HPI: Feels well today She is walking now for exercise.  Denies palpitations    Right knee pain has  been aching.   She had been on procardia after first baby in 2022 then stopped couple of months after delivery.   One week after son was born in January, she was precribed procardia again; however she never started.   She had a baby 3 months ago.   Currently breastfeeding  Consult cardiology, Dr. Tawanna Cooler 07/13/2023 for hypertension during pregnancy, palpitations. Colorectal Cancer Screening: No first-degree relative history of colon cancer Breast Cancer Screening: No first-degree relative with breast cancer Cervical Cancer Screening: due ; obtained 02/27/2019 negative malignancy, negative HPV. She is following [place], GYN; reports 04/2022, normal.          Tetanus - UTD        Exercise: Gets regular exercise.   Alcohol use:  occassional Smoking/tobacco use: Nonsmoker.    Health Maintenance  Topic Date Due   Pap with HPV screening  02/27/2024   DTaP/Tdap/Td vaccine (4 - Td or Tdap) 06/15/2030   Flu Shot  Completed   COVID-19 Vaccine  Completed   Hepatitis C Screening  Completed   HIV Screening  Completed   HPV Vaccine  Aged Out    ALLERGIES: Sulfa antibiotics  Current Outpatient Medications on File Prior to Visit  Medication Sig Dispense Refill   Drospirenone (SLYND PO) Take 1 tablet by mouth  daily.     furosemide (LASIX) 20 MG tablet Take 1 tablet (20 mg total) by mouth daily for 5 days. 5 tablet 0   Prenatal Vit-Fe Fumarate-FA (PRENATAL MULTIVITAMIN) TABS tablet Take 1 tablet by mouth at bedtime.     No current facility-administered medications on file prior to visit.    Review of Systems    Objective:    There were no vitals taken for this visit.  BP Readings from Last 3 Encounters:  07/13/23 120/80  05/17/23 (!) 146/92  05/11/23 129/82   Wt Readings from Last 3 Encounters:  07/13/23 260 lb 4.8 oz (118.1 kg)  05/08/23 273 lb 8 oz (124.1 kg)  07/27/22 253 lb 6.4 oz (114.9 kg)    Physical Exam

## 2023-08-07 DIAGNOSIS — Z0289 Encounter for other administrative examinations: Secondary | ICD-10-CM

## 2023-08-09 ENCOUNTER — Encounter (INDEPENDENT_AMBULATORY_CARE_PROVIDER_SITE_OTHER): Payer: Self-pay

## 2023-08-09 DIAGNOSIS — M25561 Pain in right knee: Secondary | ICD-10-CM | POA: Insufficient documentation

## 2023-08-09 NOTE — Assessment & Plan Note (Signed)
 Advised conservative management this time including ice ,neoprene sleeve.  Patient is focused on weight loss.  She politely declines physical therapy referral at this time and will let me know how she is doing.

## 2023-08-09 NOTE — Progress Notes (Signed)
 This encounter was created in error - please disregard.

## 2023-08-09 NOTE — Assessment & Plan Note (Addendum)
 Congratulated patient on diligence to exercise.  Clinical breast exam performed.  Deferred pelvic exam the absent complaints and Pap smear is up-to-date.

## 2023-08-09 NOTE — Assessment & Plan Note (Addendum)
 Elevated.  Advised to resume nifedipine 30 mg daily.  Previously she has done well on this medication.  Goal blood pressure less than 130/80.

## 2023-08-10 ENCOUNTER — Encounter: Payer: Self-pay | Admitting: Family

## 2023-08-14 ENCOUNTER — Encounter (INDEPENDENT_AMBULATORY_CARE_PROVIDER_SITE_OTHER): Payer: Self-pay

## 2023-08-20 IMAGING — US US OB COMP LESS 14 WK
1 series · 15 of 28 positions shown · non-contrast
Comparison: 02/01/2020

CLINICAL DATA: Pregnant, lower back pain, vaginal bleeding

EXAM:
OBSTETRIC <14 WK ULTRASOUND
TECHNIQUE: Transabdominal ultrasound was performed for evaluation of the
gestation as well as the maternal uterus and adnexal regions.

[Series 1: us ob comp less 14 wk · 31 acquisitions, 15 frames shown]
[im 1/31]
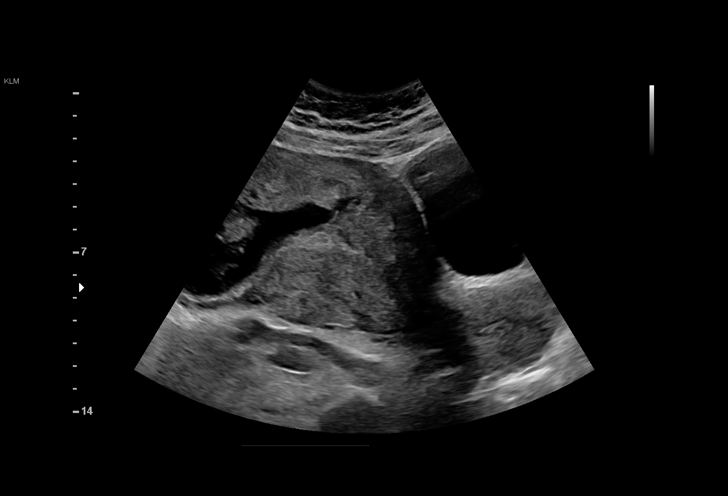
[im 3/31]
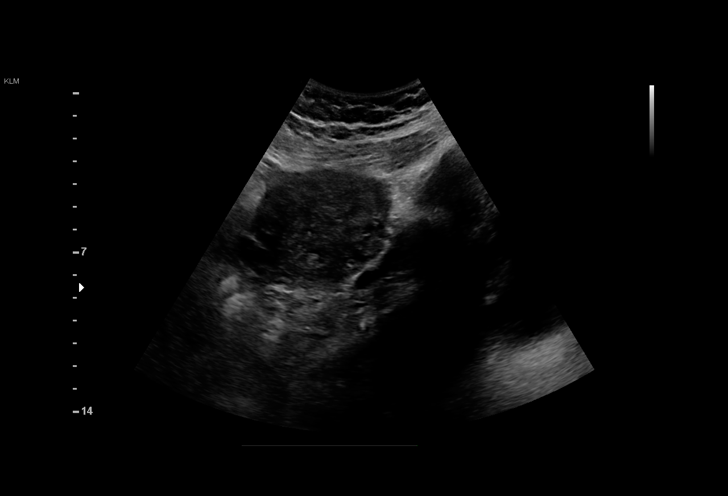
[im 5/31]
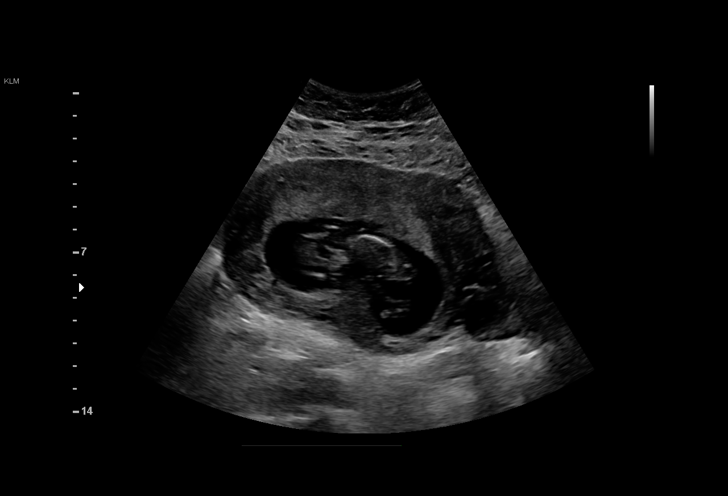
[im 7/31]
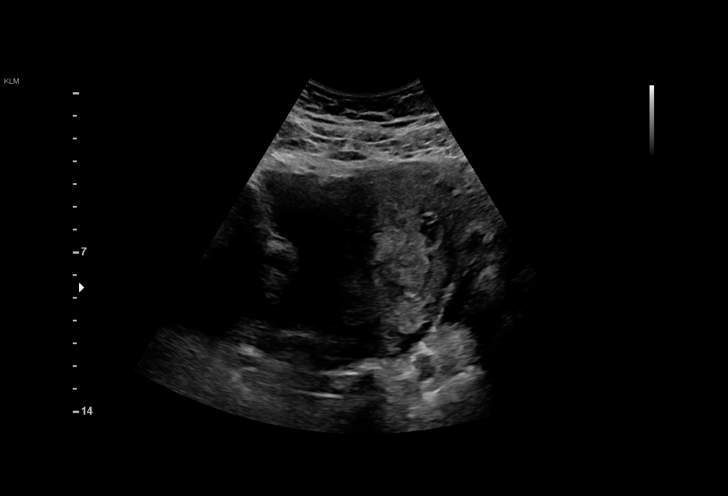
[im 9/31]
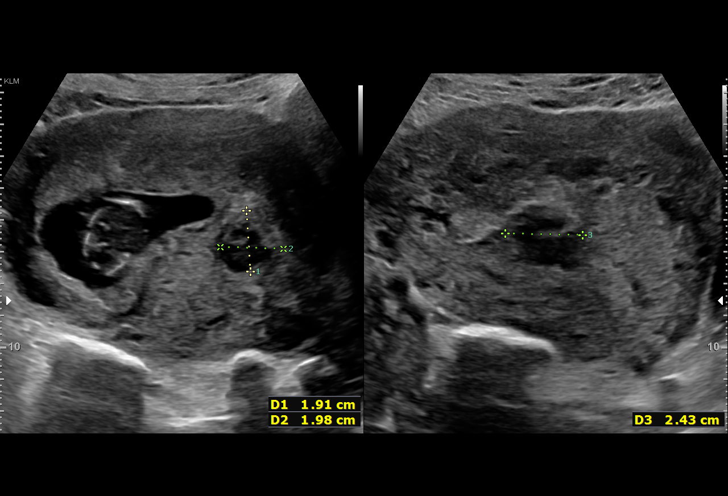
[im 12/31]
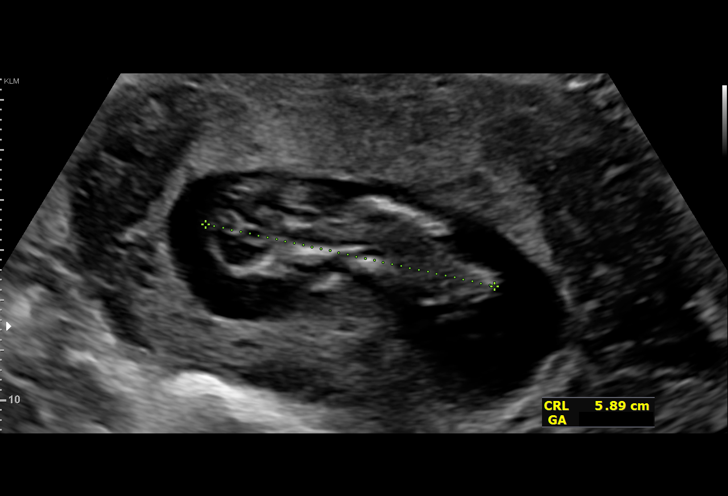
[im 14/31]
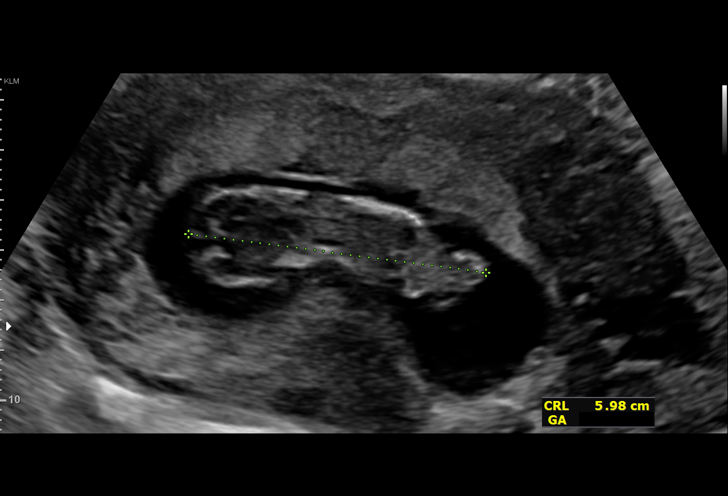
[im 16/31]
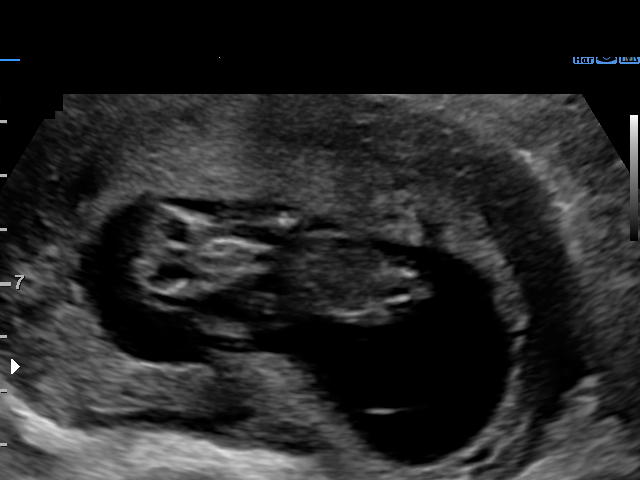
[im 17/31]
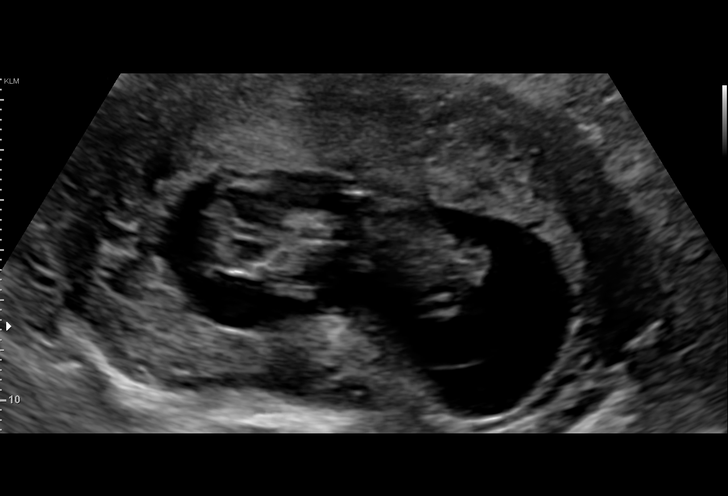
[im 19/31]
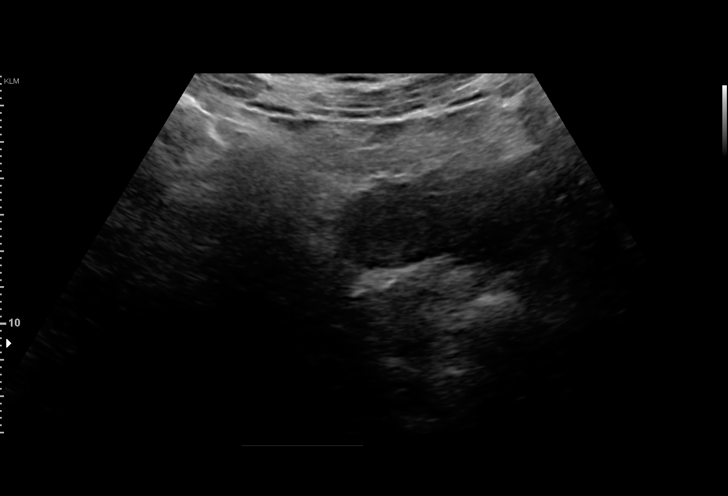
[im 22/31]
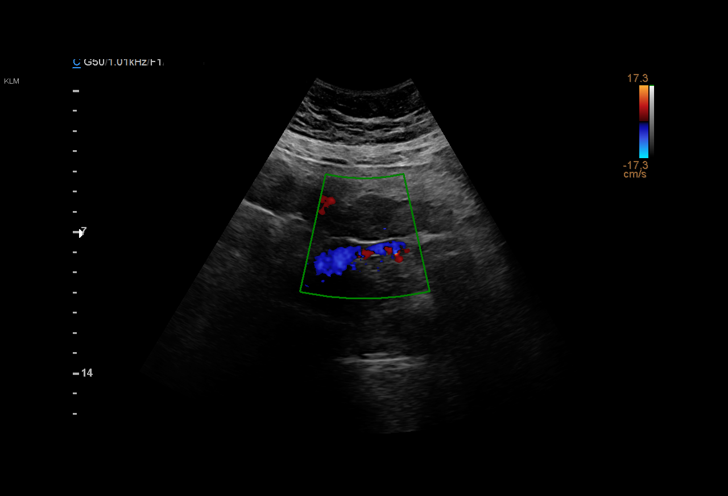
[im 24/31]
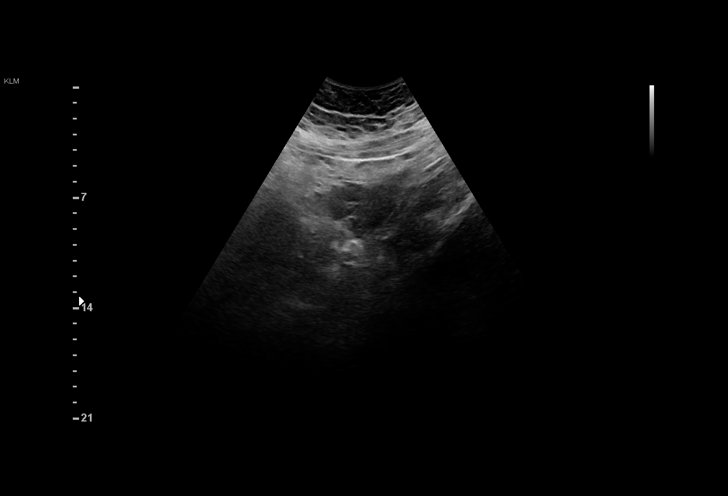
[im 26/31]
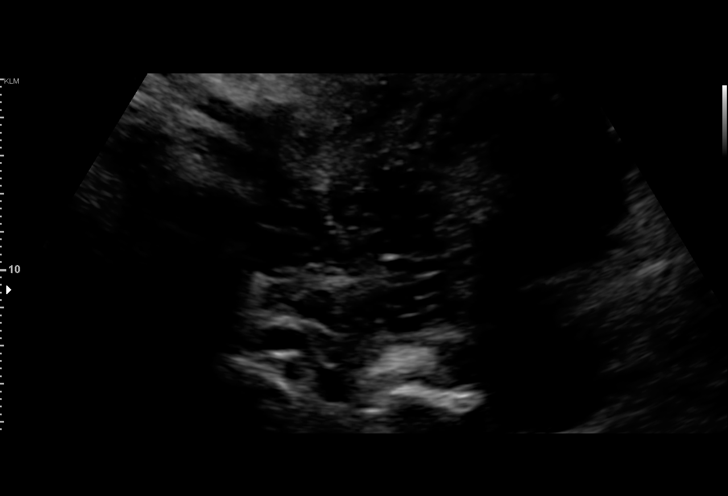
[im 28/31]
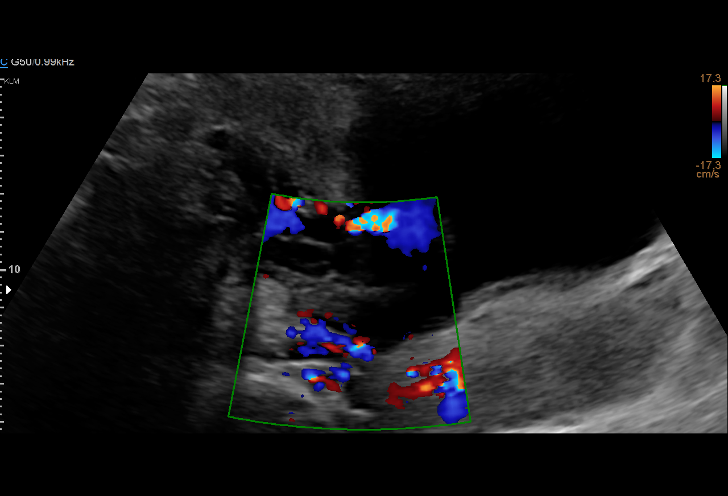
[im 31/31]
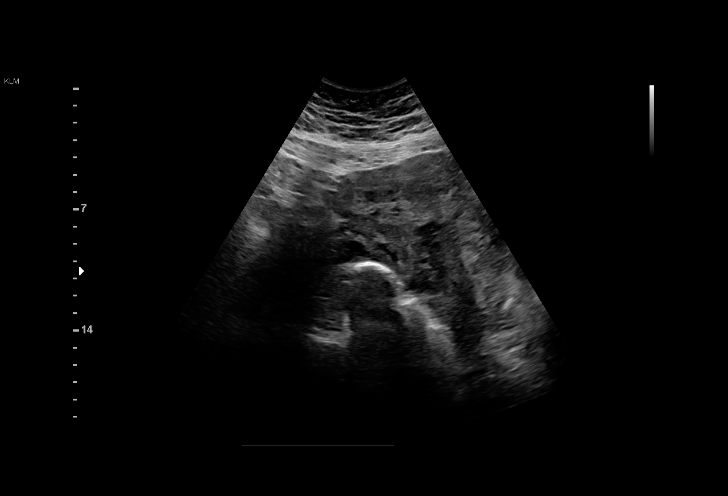

[15 of 28 positions shown; findings below may reference images not displayed]

FINDINGS: Intrauterine gestational sac: Single

Yolk sac:  Not Visualized.

Embryo:  Visualized.

Cardiac Activity: Visualized.

Heart Rate: 157 bpm

CRL:   59.6 mm   12 w 3 d                  US EDC: 09/05/2021

Subchorionic hemorrhage:  None visualized.

Maternal uterus/adnexae: Small uterine fibroids, measuring up to
cm.

Bilateral ovaries are within normal limits.

No free fluid.
IMPRESSION: Single intrauterine gestation with cardiac activity, measuring 12
weeks 3 days by crown-rump length, as above.

## 2023-09-11 ENCOUNTER — Encounter (INDEPENDENT_AMBULATORY_CARE_PROVIDER_SITE_OTHER): Payer: Self-pay | Admitting: Family Medicine

## 2023-09-11 ENCOUNTER — Ambulatory Visit (INDEPENDENT_AMBULATORY_CARE_PROVIDER_SITE_OTHER): Admitting: Family Medicine

## 2023-09-11 VITALS — BP 111/72 | HR 67 | Temp 98.7°F | Ht 69.5 in | Wt 250.0 lb

## 2023-09-11 DIAGNOSIS — E66812 Obesity, class 2: Secondary | ICD-10-CM

## 2023-09-11 DIAGNOSIS — D508 Other iron deficiency anemias: Secondary | ICD-10-CM | POA: Diagnosis not present

## 2023-09-11 DIAGNOSIS — E7841 Elevated Lipoprotein(a): Secondary | ICD-10-CM

## 2023-09-11 DIAGNOSIS — R0602 Shortness of breath: Secondary | ICD-10-CM

## 2023-09-11 DIAGNOSIS — Z6836 Body mass index (BMI) 36.0-36.9, adult: Secondary | ICD-10-CM

## 2023-09-11 DIAGNOSIS — Z8759 Personal history of other complications of pregnancy, childbirth and the puerperium: Secondary | ICD-10-CM

## 2023-09-11 DIAGNOSIS — R5383 Other fatigue: Secondary | ICD-10-CM

## 2023-09-11 DIAGNOSIS — E559 Vitamin D deficiency, unspecified: Secondary | ICD-10-CM | POA: Diagnosis not present

## 2023-09-11 DIAGNOSIS — Z6838 Body mass index (BMI) 38.0-38.9, adult: Secondary | ICD-10-CM

## 2023-09-11 DIAGNOSIS — Z1331 Encounter for screening for depression: Secondary | ICD-10-CM

## 2023-09-11 NOTE — Progress Notes (Signed)
 Jamie Bean, D.O.  AB[facility], AB[facility] Specializing in Clinical Bariatric Medicine Office located at: [address] [place], [place]  [address]   Bariatric Medicine Visit  Dear Jamie Bean, Jamie Lew, FNP   Thank you for referring Jamie Bean to our clinic today for evaluation.  We performed a consultation to discuss her options for treatment and educate the patient on her disease state.  The following note includes my evaluation and treatment recommendations.   Please do not hesitate to reach out to me directly if you have any further concerns.   Assessment and Plan:   Orders Placed This Encounter  Procedures   VITAMIN D  25 Hydroxy (Vit-D Deficiency, Fractures)   TSH   T4, free   T3   Lipid panel   Insulin , random   Hemoglobin A1c   Folate   Comprehensive metabolic panel with GFR   Vitamin B12   CBC with Differential/Platelet   EKG 12-Lead    Medications Discontinued During This Encounter  Medication Reason   furosemide  (LASIX ) 20 MG tablet    NIFEdipine  (PROCARDIA  XL) 30 MG 24 hr tablet      FOR THE DISEASE OF OBESITY:  Class 2 severe obesity with serious comorbidity and body mass index (BMI) of 36.0 to 36.9 in adult, unspecified obesity type [facility]) Assessment & Plan: Jamie Bean is currently in the action stage of change. As such, her goal is to start our weight management plan.  She has agreed to: Journal 1350-1450 calories and 115++ grams of protein daily using the CAT 2 MP w/ 6 ounces of lean protein at lunch as a guide.    Behavioral Intervention We discussed the following Behavioral Modification Strategies today: begin to work on maintaining a reduced calorie state, getting the recommended amount of protein, incorporating whole foods, making healthy choices, staying well hydrated, practicing mindfulness when eating, using AI or [email or web address].com for recipe ideas.   Additional resources provided today: Handout on CAT 2 meal plan, Provided patient with  personalized instruction and demonstration on the use of artificial intelligence for recipes and calorie tracking, Recipe Packet #1 and #2.   Evidence-based interventions for health behavior change were utilized today including the discussion of self monitoring techniques, problem-solving barriers and SMART goal setting techniques.    Pt will specifically work on: measuring intake of lean proteins and veggies   Recommended Physical Activity Goals Jamie Bean has been advised to work up to 150 minutes of moderate intensity aerobic activity a week and strengthening exercises 2-3 times per week for cardiovascular health, weight loss maintenance and preservation of muscle mass.   She has agreed to : maintain current level of activity.    Pharmacotherapy We both agreed to : start nutritional and behavioral strategies   ASSOCIATED CONDITIONS ADDRESSED TODAY:  Fatigue Assessment & Plan: Jamie Bean does feel that her weight is causing her energy to be lower than it should be. Fatigue may be related to obesity, depression or many other causes. she does not appear to have any red flag symptoms and this appears to most likely be related to her current lifestyle habits and dietary intake.  Labs will be ordered and reviewed with her at their next office visit in two weeks.  Epworth sleepiness scale is 7 and appears to be within normal limits. Jamie Bean admits to occasional daytime somnolence and admits to waking up still tired.  Jamie Bean generally gets about 8-9 hours of sleep per night, and states that she has generally restful sleep. Snoring is not  present. Apneic episodes are not present.   ECG: Done 07/13/2023 through cardiology. Reviewed/ interpreted independently.  Normal sinus rhythm, rate 63bpm; reassuring without any acute abnormalities, will continue to monitor for symptoms.   Shortness of breath on exertion Assessment & Plan: Jamie Bean does feel that she gets out of breath more easily than she  used to when she exercises and seems to be worsening over time with weight gain.  This has gotten worse recently. Jamie Bean denies shortness of breath at rest or orthopnea. Pt denies chest pain, dizziness, heart palpitations, or excessive diaphoresis or nausea with activity.  This is not new and is ongoing.  Jamie Bean's shortness of breath appears to be obesity related and exercise induced, as they do not appear to have any "red flag" symptoms/ concerns today.  Also, this condition appears to be related to a state of poor cardiovascular conditioning   Obtain labs today and will be reviewed with her at their next office visit in two weeks.  Indirect Calorimeter completed today to help guide our dietary regimen. It shows a VO2 of 276 and a REE of 1901.  Her calculated basal metabolic rate is 6213 thus her resting energy expenditure is worse than expected.  Patient agreed to work on weight loss at this time.  As Jamie Bean progresses through our weight loss program, we will gradually increase exercise as tolerated to treat her current condition.   If Jamie Bean follows our recommendations and loses 5-10% of their weight without improvement of her shortness of breath or if at any time, symptoms become more concerning, they agree to urgently follow up with their PCP/ specialist for further consideration/ evaluation.   Jamie Bean verbalizes agreement with this plan.    Depression Screen  Assessment & Plan: Most recent PHQ-9 score:    09/11/2023    7:40 AM 08/06/2023   10:59 AM 08/06/2023   10:58 AM 07/26/2022    3:19 PM 09/09/2020    9:19 AM  Depression screen PHQ 2/9  Decreased Interest 0 0 0 0 0  Down, Depressed, Hopeless 0 0 0 0 0  PHQ - 2 Score 0 0 0 0 0  Altered sleeping 1 0  1 0  Tired, decreased energy 1 0  0 0  Change in appetite 1 0  0 0  Feeling bad or failure about yourself  0 0  0 0  Trouble concentrating 0 0  0 0  Moving slowly or fidgety/restless 0 0  0 0  Suicidal thoughts 0 0  0 0  PHQ-9  Score 3 0  1 0  Difficult doing work/chores Not difficult at all Not difficult at all  Not difficult at all Not difficult at all    Denies any SI/HI. Mood is stable. She acknowledges sometimes eating when bored and as a reward; overall feels that emotional eating is not a problematic issue. We will monitor this closely and work on CBT to help improve the non-hunger eating patterns. Can refer to bariatric psychology in the future if needed.     Elevated lipoprotein(a) Assessment & Plan: Most recent lipid panel: Lab Results  Component Value Date   CHOL 183 08/06/2023   HDL 51.20 08/06/2023   LDLCALC 124 (H) 08/06/2023   TRIG 42.0 08/06/2023   CHOLHDL 4 08/06/2023   Diet/lifestyle approach. H/o elevated LDL. Elevated LDL may be secondary to nutrition, genetics and spillover effect from excess adiposity. Begin diet low in saturated and trans fats. Losing 10% of body weight may improve LDL values.  History of gestational hypertension Assessment & Plan: Last 3 blood pressure readings in our office are as follows: BP Readings from Last 3 Encounters:  09/11/23 111/72  08/06/23 (!) 142/82  08/09/23 (!) 143/82   The ASCVD Risk score (Arnett DK, et al., 2019) failed to calculate for the following reasons:   The 2019 ASCVD risk score is only valid for ages 37 to 84  Lab Results  Component Value Date   CREATININE 0.92 08/06/2023   Per hx. Was on Nifedipine  for a short time after her 1st delivery. Her blood pressure is at goal today. Counseling was done with pt how independent of her weight, having a h/o gestational HTN puts her at a significantly higher risk for developing HTN  in the future. I emphasized the importance of minimizing risks through starting her low sodium nutritional plan, increasing her exercise as able, and improving cardiovascular fitness.     Vitamin D  deficiency Assessment & Plan: Most recent Vitamin D : Lab Results  Component Value Date   VD25OH 31.89 08/06/2023    VD25OH 26.25 (L) 07/26/2022   VD25OH 30.7 04/06/2020   Not currently taking any Vitamin D  supplementation. Will advise further at next OV.    Other iron  deficiency anemia Assessment & Plan: Per hx. Is taking a daily Prenatal Vitamin with Iron . Continue regimen. Will check CBC today.    FOLLOW UP:   Follow up in 2 weeks. She was informed of the importance of frequent follow up visits to maximize her success with intensive lifestyle modifications for her multiple health conditions.  Jamie Bean is aware that we will review all of her lab results at our next visit.  She is aware that if anything is critical/ life threatening with the results, we will be contacting her via MyChart prior to the office visit to discuss management.    Chief Complaint:   OBESITY Shawnalee Penuelas (MR# [id number]) is a pleasant 38 y.o. female who presents for evaluation and treatment of obesity and related comorbidities. Current BMI is Body mass index is 36.39 kg/m. [id number] Ferron has been struggling with her weight for many years and has been unsuccessful in either losing weight, maintaining weight loss, or reaching her healthy weight goal.  Jamie Bean is currently in the action stage of change and ready to dedicate time achieving and maintaining a healthier weight. Jamie Bean is interested in becoming our patient and working on intensive lifestyle modifications including (but not limited to) diet and exercise for weight loss.  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 Patient lives with her husband Georgette Kins and 4 children ( 66 month old son , 2 y.o daughter , 41 y.o daughter, and 52 y.o son)  Pt was previously in our program from 2019 - 2022.   Desires to be 190 lbs by Jan 2026.   Started gaining excess weight d/t "increase in eating and more sedentary lifestyle".  Walks and or weight lifts 20 minutes 2-3 times/week.   Our journaling and structured meal plan worked  best for her.   Denies eating outside the home.   Obstacle to cooking: "caring for children"  Craves cured meats, cheese, and fatty foods.   Snacks on peanut butter and pretzels frequently after dinner.   Skips breakfast and or lunch often.   Liquid calorie intake:  coffee with creamer and sugar, tea with sugar, and occasionally drinks wine once weekly.   Worst food habits: "snacking instead of having a  full meal and eating late at night".  Subjective:   This is the patient's first visit at [facility].  The patient's NEW PATIENT PACKET that they filled out prior to today's office visit was reviewed at length and information from that paperwork was included within the following office visit note.    Included in the packet: current and past health history, medications, allergies, ROS, gynecologic history (women only), surgical history, family history, social history, weight history, weight loss surgery history (for those that have had weight loss surgery), nutritional evaluation, mood and food questionnaire along with a depression screening (PHQ9) on all patients, an Epworth questionnaire, sleep habits questionnaire, patient life and health improvement goals questionnaire. These will all be scanned into the patient's chart under the "media" tab.   Review of Systems: Please refer to new patient packet scanned into media. Pertinent positives were addressed with patient today.  Reviewed by clinician on day of visit: allergies, medications, problem list, medical history, surgical history, family history, social history, and previous encounter notes.  During the visit, I independently reviewed the patient's EKG, bioimpedance scale results, and indirect calorimeter results. I used this information to tailor a meal plan for the patient that will help Jamie Bean to lose weight and will improve her obesity-related conditions going forward.  I performed a medically necessary  appropriate examination and/or evaluation. I discussed the assessment and treatment plan with the patient. The patient was provided an opportunity to ask questions and all were answered. The patient agreed with the plan and demonstrated an understanding of the instructions. Labs were ordered today (unless patient declined them) and will be reviewed with the patient at our next visit unless more critical results need to be addressed immediately. Clinical information was updated and documented in the EMR.   Objective:   PHYSICAL EXAM: Blood pressure 111/72, pulse 67, temperature 98.7 F (37.1 C), height 5' 9.5" (1.765 m), weight 250 lb (113.4 kg), SpO2 98%, currently breastfeeding. Body mass index is 36.39 kg/m.  General: Well Developed, well nourished, and in no acute distress.  HEENT: Normocephalic, atraumatic; EOMI, sclerae are anicteric. Skin: Warm and dry, good turgor Chest:  Normal excursion, shape, no gross ABN Respiratory: No conversational dyspnea; speaking in full sentences NeuroM-Sk:  Normal gross ROM * 4 extremities  Psych: A and O *3, insight adequate, mood- full   Anthropometric Measurements Height: 5' 9.5" (1.765 m) Weight: 250 lb (113.4 kg) BMI (Calculated): 36.4 Weight at Last Visit: N/a Weight Lost Since Last Visit: N/a Weight Gained Since Last Visit: N/a Starting Weight: 250lb Total Weight Loss (lbs): 0 lb (0 kg) Peak Weight: 260lb Waist Measurement : 45 inches   Body Composition  Body Fat %: 40.8 % Fat Mass (lbs): 102.2 lbs Muscle Mass (lbs): 141 lbs Total Body Water  (lbs): 93.4 lbs Visceral Fat Rating : 10   Other Clinical Data RMR: 1901 Fasting: Yes Labs: Yes Today's Visit #: 1 Starting Date: 09/11/23 Comments: First Visit    DIAGNOSTIC DATA REVIEWED:  BMET    Component Value Date/Time   NA 140 08/06/2023 1134   NA 138 04/06/2020 0837   K 4.1 08/06/2023 1134   CL 107 08/06/2023 1134   CO2 27 08/06/2023 1134   GLUCOSE 98 08/06/2023 1134    BUN 18 08/06/2023 1134   BUN 8 04/06/2020 0837   CREATININE 0.92 08/06/2023 1134   CREATININE 0.77 09/05/2019 1453   CALCIUM  9.2 08/06/2023 1134   GFRNONAA >60 05/17/2023 1842   GFRAA 130  04/06/2020 0837   Lab Results  Component Value Date   HGBA1C 5.6 08/06/2023   HGBA1C 5.6 08/28/2016   Lab Results  Component Value Date   INSULIN  7.3 07/01/2018   INSULIN  5.8 10/09/2017   Lab Results  Component Value Date   TSH 0.63 08/06/2023   CBC    Component Value Date/Time   WBC 6.1 08/06/2023 1134   RBC 4.51 08/06/2023 1134   HGB 12.4 08/06/2023 1134   HGB 11.8 10/09/2017 0910   HCT 37.6 08/06/2023 1134   HCT 36.0 10/09/2017 0910   PLT 230.0 08/06/2023 1134   MCV 83.3 08/06/2023 1134   MCV 81 10/09/2017 0910   MCH 27.4 05/17/2023 1842   MCHC 32.9 08/06/2023 1134   RDW 13.6 08/06/2023 1134   RDW 13.9 10/09/2017 0910   Iron  Studies    Component Value Date/Time   IRON  90 07/26/2022 1540   TIBC 295.4 07/26/2022 1540   FERRITIN 44.8 07/26/2022 1540   IRONPCTSAT 30.5 07/26/2022 1540   IRONPCTSAT 47 (H) 09/09/2020 0941   Lipid Panel     Component Value Date/Time   CHOL 183 08/06/2023 1134   CHOL 245 (H) 04/06/2020 0837   TRIG 42.0 08/06/2023 1134   HDL 51.20 08/06/2023 1134   HDL 82 04/06/2020 0837   CHOLHDL 4 08/06/2023 1134   VLDL 8.4 08/06/2023 1134   LDLCALC 124 (H) 08/06/2023 1134   LDLCALC 143 (H) 04/06/2020 0837   LDLCALC 118 (H) 09/05/2019 1453   Hepatic Function Panel     Component Value Date/Time   PROT 7.4 08/06/2023 1134   PROT 6.3 04/06/2020 0837   ALBUMIN 4.3 08/06/2023 1134   ALBUMIN 3.7 (L) 04/06/2020 0837   AST 19 08/06/2023 1134   ALT 18 08/06/2023 1134   ALKPHOS 44 08/06/2023 1134   BILITOT 0.6 08/06/2023 1134   BILITOT 0.3 04/06/2020 0837      Component Value Date/Time   TSH 0.63 08/06/2023 1134   Nutritional Lab Results  Component Value Date   VD25OH 31.89 08/06/2023   VD25OH 26.25 (L) 07/26/2022   VD25OH 30.7 04/06/2020     Attestation Statements:   I, Special Puri, acting as a Stage manager for Jamie Sensor, DO., have compiled all relevant documentation for today's office visit on behalf of Jamie Sensor, DO, while in the presence of [employer], DO.  I have spent 55 minutes in the care of the patient today including: 10 minutes before the visit reviewing and prepping the chart. 42 minutes face-to-face assessing and reviewing listed medical problems as outlined in obesity care plan, providing nutritional and behavioral counseling on topics outlined in the obesity care plan, independently interpreting test results and goals of care, as described in assessment and plan, reviewing and discussing biometric information and progress, and ordering diagnostics - see orders. 3 minutes after the visit updating chart and documentation   I have reviewed the above documentation for accuracy and completeness, and I agree with the above. Jamie Bean, D.O.  The 21st Century Cures Act was signed into law in 2016 which includes the topic of electronic health records.  This provides immediate access to information in MyChart.  This includes consultation notes, operative notes, office notes, lab results and pathology reports.  If you have any questions about what you read please let us  know at your next visit so we can discuss your concerns and take corrective action if need be.  We are right here with you.

## 2023-09-12 LAB — CBC WITH DIFFERENTIAL/PLATELET
Basophils Absolute: 0 10*3/uL (ref 0.0–0.2)
Basos: 1 %
EOS (ABSOLUTE): 0.1 10*3/uL (ref 0.0–0.4)
Eos: 2 %
Hematocrit: 38.9 % (ref 34.0–46.6)
Hemoglobin: 12.8 g/dL (ref 11.1–15.9)
Immature Grans (Abs): 0 10*3/uL (ref 0.0–0.1)
Immature Granulocytes: 0 %
Lymphocytes Absolute: 1.9 10*3/uL (ref 0.7–3.1)
Lymphs: 31 %
MCH: 26.8 pg (ref 26.6–33.0)
MCHC: 32.9 g/dL (ref 31.5–35.7)
MCV: 82 fL (ref 79–97)
Monocytes Absolute: 0.4 10*3/uL (ref 0.1–0.9)
Monocytes: 7 %
Neutrophils Absolute: 3.6 10*3/uL (ref 1.4–7.0)
Neutrophils: 59 %
Platelets: 194 10*3/uL (ref 150–450)
RBC: 4.77 x10E6/uL (ref 3.77–5.28)
RDW: 13.1 % (ref 11.7–15.4)
WBC: 6 10*3/uL (ref 3.4–10.8)

## 2023-09-12 LAB — COMPREHENSIVE METABOLIC PANEL WITH GFR
ALT: 14 IU/L (ref 0–32)
AST: 18 IU/L (ref 0–40)
Albumin: 4.6 g/dL (ref 3.9–4.9)
Alkaline Phosphatase: 53 IU/L (ref 44–121)
BUN/Creatinine Ratio: 22 (ref 9–23)
BUN: 17 mg/dL (ref 6–20)
Bilirubin Total: 0.2 mg/dL (ref 0.0–1.2)
CO2: 22 mmol/L (ref 20–29)
Calcium: 9.6 mg/dL (ref 8.7–10.2)
Chloride: 103 mmol/L (ref 96–106)
Creatinine, Ser: 0.78 mg/dL (ref 0.57–1.00)
Globulin, Total: 2.8 g/dL (ref 1.5–4.5)
Glucose: 93 mg/dL (ref 70–99)
Potassium: 4.3 mmol/L (ref 3.5–5.2)
Sodium: 139 mmol/L (ref 134–144)
Total Protein: 7.4 g/dL (ref 6.0–8.5)
eGFR: 99 mL/min/{1.73_m2} (ref >59)

## 2023-09-12 LAB — FOLATE: Folate: >20 ng/mL (ref >3.0)

## 2023-09-12 LAB — VITAMIN B12: Vitamin B-12: 864 pg/mL (ref 232–1245)

## 2023-09-12 LAB — LIPID PANEL
Chol/HDL Ratio: 3.7 ratio (ref 0.0–4.4)
Cholesterol, Total: 194 mg/dL (ref 100–199)
HDL: 53 mg/dL (ref >39)
LDL Chol Calc (NIH): 130 mg/dL — ABNORMAL HIGH (ref 0–99)
Triglycerides: 61 mg/dL (ref 0–149)
VLDL Cholesterol Cal: 11 mg/dL (ref 5–40)

## 2023-09-12 LAB — HEMOGLOBIN A1C
Est. average glucose Bld gHb Est-mCnc: 123 mg/dL
Hgb A1c MFr Bld: 5.9 % — ABNORMAL HIGH (ref 4.8–5.6)

## 2023-09-12 LAB — T4, FREE: Free T4: 1.46 ng/dL (ref 0.82–1.77)

## 2023-09-12 LAB — T3: T3, Total: 100 ng/dL (ref 71–180)

## 2023-09-12 LAB — TSH: TSH: 0.798 u[IU]/mL (ref 0.450–4.500)

## 2023-09-12 LAB — INSULIN, RANDOM: INSULIN: 7.8 u[IU]/mL (ref 2.6–24.9)

## 2023-09-12 LAB — VITAMIN D 25 HYDROXY (VIT D DEFICIENCY, FRACTURES): Vit D, 25-Hydroxy: 42.7 ng/mL (ref 30.0–100.0)

## 2023-09-25 ENCOUNTER — Ambulatory Visit (INDEPENDENT_AMBULATORY_CARE_PROVIDER_SITE_OTHER): Admitting: Family Medicine

## 2023-09-25 ENCOUNTER — Encounter (INDEPENDENT_AMBULATORY_CARE_PROVIDER_SITE_OTHER): Payer: Self-pay | Admitting: Family Medicine

## 2023-09-25 VITALS — BP 127/74 | HR 78 | Temp 98.7°F | Ht 69.5 in | Wt 244.0 lb

## 2023-09-25 DIAGNOSIS — E66812 Obesity, class 2: Secondary | ICD-10-CM

## 2023-09-25 DIAGNOSIS — R7303 Prediabetes: Secondary | ICD-10-CM

## 2023-09-25 DIAGNOSIS — E559 Vitamin D deficiency, unspecified: Secondary | ICD-10-CM | POA: Diagnosis not present

## 2023-09-25 DIAGNOSIS — E7841 Elevated Lipoprotein(a): Secondary | ICD-10-CM

## 2023-09-25 DIAGNOSIS — D508 Other iron deficiency anemias: Secondary | ICD-10-CM

## 2023-09-25 DIAGNOSIS — Z6836 Body mass index (BMI) 36.0-36.9, adult: Secondary | ICD-10-CM

## 2023-09-25 MED ORDER — VITAMIN D (CHOLECALCIFEROL) 25 MCG (1000 UT) PO TABS: ORAL_TABLET | ORAL | Status: DC

## 2023-09-25 NOTE — Progress Notes (Signed)
 Jamie Bean, D.O.  ABFM, ABOM Clinical Bariatric Medicine Physician  Office located at: [address] [place], [place]  [address]   Assessment and Plan:  No orders of the defined types were placed in this encounter.   There are no discontinued medications.   No orders of the defined types were placed in this encounter.   FOR THE DISEASE OF OBESITY:  Class 2 severe obesity with serious comorbidity and body mass index (BMI) of 36.0 to 36.9 in adult, unspecified obesity type [facility]) Assessment & Plan: Since last office visit on 09/11/2023 patient's  Muscle mass has not changed. Fat mass has decreased by 3.6 lb. Total body water  has decreased by 1.4 lb.  Counseling done on how various foods will affect these numbers and how to maximize success  Total lbs lost to date: - 6 lbs  Total weight loss percentage to date: 2.40%    Recommended Dietary Goals Jamie Bean is currently in the action stage of change. As such, her goal is to continue weight management plan.  She has agreed to: continue current plan   Behavioral Intervention We discussed the following today: healthy fats, high protein pasta options, increasing fiber rich foods, reading food labels , and focusing on food with a 10:1 ratio of calories: grams of protein  Additional resources provided today: Handout on protein equivalents of 2 ounces of meat or seafood, Handout on Pre-Diabetes education , Handout on insulin  resistance education, and Handout on risks/ benefits of metformin and associated Myths of use  Evidence-based interventions for health behavior change were utilized today including the discussion of self monitoring techniques, problem-solving barriers and SMART goal setting techniques.  Regarding patient's less desirable eating habits and patterns, we employed the technique of small changes.   Pt will specifically work on: n/a   Recommended Physical Activity Goals Jamie Bean has been advised to work up to  150 minutes of moderate intensity aerobic activity a week and strengthening exercises 2-3 times per week for cardiovascular health, weight loss maintenance and preservation of muscle mass.   She has agreed to continue dance classes and consider ADDING some walking.    Pharmacotherapy We both agreed to continue with nutritional and behavioral strategies   ASSOCIATED CONDITIONS ADDRESSED TODAY:  Prediabetes -new onset Assessment & Plan: Most recent labs: Lab Results  Component Value Date   HGBA1C 5.9 (H) 09/11/2023   HGBA1C 5.6 08/06/2023   HGBA1C 5.7 07/26/2022   INSULIN  7.8 09/11/2023   INSULIN  7.3 07/01/2018   INSULIN  6.9 02/04/2018   Lab Results  Component Value Date   CREATININE 0.78 09/11/2023   BUN 17 09/11/2023   NA 139 09/11/2023   K 4.3 09/11/2023   CL 103 09/11/2023   CO2 22 09/11/2023      Component Value Date/Time   PROT 7.4 09/11/2023 0907   ALBUMIN 4.6 09/11/2023 0907   AST 18 09/11/2023 0907   ALT 14 09/11/2023 0907   ALKPHOS 53 09/11/2023 0907   BILITOT 0.2 09/11/2023 0907   Lab Results  Component Value Date   TSH 0.798 09/11/2023   FREET4 1.46 09/11/2023   T3TOTAL 100 09/11/2023   Lab Results  Component Value Date   VITAMINB12 864 09/11/2023   FOLATE >20.0 09/11/2023    Her Hemoglobin A1c is consistent with Pre-DM. Fasting insulin  is slightly above goal of <5. Kidney function, liver enzymes, thyroid  levels, B12, and folate are WNL.   Patient aware of disease state and risk of progression. This may contribute  to abnormal cravings, fatigue and diabetic complications without having diabetes.   Explained role of simple carbs and insulin  levels on hunger and cravings Educated patient that having adequate amounts of protein with each meal is important for increasing muscle mass, stabilizing sugars, controlling hunger and cravings, and improving thermogenesis.   Continue working on nutrition plan to decrease simple carbohydrates, increase lean  proteins and exercise to promote weight loss and improve glycemic control and prevent progression to T2DM. Losing 10% or more of body weight may improve condition. Consider starting Metformin in the future; educational handouts provided.    Other iron  deficiency anemia Assessment & Plan: Most recent lab:  Lab Results  Component Value Date   WBC 6.0 09/11/2023   HGB 12.8 09/11/2023   HCT 38.9 09/11/2023   MCV 82 09/11/2023   PLT 194 09/11/2023   Is taking a daily prenatal MVM with iron . Reviewed recent CBC which is within acceptable ranges. Continue regimen and iron -rich diet.    Vitamin D  deficiency Assessment & Plan: Most recent VD: Lab Results  Component Value Date   VD25OH 42.7 09/11/2023   VD25OH 31.89 08/06/2023   VD25OH 26.25 (L) 07/26/2022   Is taking a daily Prenatal MVM with iron . Her Vitamin D  levels are not at goal of 50 to 70. Pt has been instructed to CONTINUE her Pre-natal MVM and ADD OTC Vitamin D  1,000 units daily. Recheck in 3-4 mos.   Elevated lipoprotein(a) Assessment & Plan: Most recent lab: Lab Results  Component Value Date   CHOL 194 09/11/2023   HDL 53 09/11/2023   LDLCALC 130 (H) 09/11/2023   TRIG 61 09/11/2023   CHOLHDL 3.7 09/11/2023   Diet/lifestyle approach. Lipid panel WNL w/exception of LDL. Recommended LDL is <100. Limit red meat intake to no more than two days a week. Pt advised to reduce saturated and trans fats in diet. Losing 10% of body weight may improve LDL values. Increase exercise as able.    FOLLOW UP:   No follow-ups on file. She was informed of the importance of frequent follow up visits to maximize her success with intensive lifestyle modifications for her multiple health conditions.  Subjective:   Chief complaint: Obesity Jamie Bean is here to discuss her progress with her obesity treatment plan. She is  journaling 1350-1450 calories and 115++ grams of protein daily using the CAT 2 MP w/ 6 ounces of lean protein at lunch as  a guide and states she is following her eating plan approximately 75 % of the time. She states she is doing dance classes 20 minutes 4 days per week.  Interval History:  Jamie Bean is here today for her first follow-up office visit since starting the program with us . Patient is off to a good start and has lost 6 lbs. The 1st week she "struggled" b/c she did not have her meal plan foods at home. The 2nd week she followed her CAT 2 MP diligently. Has not journaled yet. When eating on plan, she felt satiated and denies excessive hunger and cravings.   Pharmacotherapy for weight loss: none   Review of Systems:  Pertinent positives were addressed with patient today.   Reviewed by clinician on day of visit: allergies, medications, problem list, medical history, surgical history, family history, social history, and previous encounter notes.  Weight Summary and Biometrics   Weight Lost Since Last Visit: 6lb  Weight Gained Since Last Visit: 0lb   Vitals Temp: 98.7 F (37.1 C) BP: 127/74 Pulse Rate: 78 SpO2:  99 %   Anthropometric Measurements Height: 5' 9.5" (1.765 m) Weight: 244 lb (110.7 kg) BMI (Calculated): 35.53 Weight at Last Visit: 250lb Weight Lost Since Last Visit: 6lb Weight Gained Since Last Visit: 0lb Starting Weight: 250lb Total Weight Loss (lbs): 6 lb (2.722 kg) Peak Weight: 260lb Waist Measurement : 45 inches   Body Composition  Body Fat %: 39.2 % Fat Mass (lbs): 98.6 lbs Muscle Mass (lbs): 141 lbs Total Body Water  (lbs): 92 lbs Visceral Fat Rating : 9   Other Clinical Data RMR: 1901 Fasting: No Labs: No Today's Visit #: 2 Starting Date: 09/11/23 Comments: Second Visit   Objective:   PHYSICAL EXAM:  Blood pressure 127/74, pulse 78, temperature 98.7 F (37.1 C), height 5' 9.5" (1.765 m), weight 244 lb (110.7 kg), SpO2 99%, currently breastfeeding. Body mass index is 35.52 kg/m.  General: she is overweight, cooperative and in no acute distress.    HEENT: EOMI, sclerae are anicteric. Lungs: Normal breathing effort, no conversational dyspnea. M-Sk:  Normal gross ROM * 4 extremities  PSYCH: Has normal mood, affect and thought process. Neurologic: No gross sensory or motor deficits. Well developed, A and O * 3  DIAGNOSTIC DATA REVIEWED:  BMET    Component Value Date/Time   NA 139 09/11/2023 0907   K 4.3 09/11/2023 0907   CL 103 09/11/2023 0907   CO2 22 09/11/2023 0907   GLUCOSE 93 09/11/2023 0907   GLUCOSE 98 08/06/2023 1134   BUN 17 09/11/2023 0907   CREATININE 0.78 09/11/2023 0907   CREATININE 0.77 09/05/2019 1453   CALCIUM  9.6 09/11/2023 0907   GFRNONAA >60 05/17/2023 1842   GFRAA 130 04/06/2020 0837   Lab Results  Component Value Date   HGBA1C 5.9 (H) 09/11/2023   HGBA1C 5.6 08/28/2016   Lab Results  Component Value Date   INSULIN  7.8 09/11/2023   INSULIN  5.8 10/09/2017   Lab Results  Component Value Date   TSH 0.798 09/11/2023   CBC    Component Value Date/Time   WBC 6.0 09/11/2023 0907   WBC 6.1 08/06/2023 1134   RBC 4.77 09/11/2023 0907   RBC 4.51 08/06/2023 1134   HGB 12.8 09/11/2023 0907   HCT 38.9 09/11/2023 0907   PLT 194 09/11/2023 0907   MCV 82 09/11/2023 0907   MCH 26.8 09/11/2023 0907   MCH 27.4 05/17/2023 1842   MCHC 32.9 09/11/2023 0907   MCHC 32.9 08/06/2023 1134   RDW 13.1 09/11/2023 0907   Iron  Studies    Component Value Date/Time   IRON  90 07/26/2022 1540   TIBC 295.4 07/26/2022 1540   FERRITIN 44.8 07/26/2022 1540   IRONPCTSAT 30.5 07/26/2022 1540   IRONPCTSAT 47 (H) 09/09/2020 0941   Lipid Panel     Component Value Date/Time   CHOL 194 09/11/2023 0907   TRIG 61 09/11/2023 0907   HDL 53 09/11/2023 0907   CHOLHDL 3.7 09/11/2023 0907   CHOLHDL 4 08/06/2023 1134   VLDL 8.4 08/06/2023 1134   LDLCALC 130 (H) 09/11/2023 0907   LDLCALC 118 (H) 09/05/2019 1453   Hepatic Function Panel     Component Value Date/Time   PROT 7.4 09/11/2023 0907   ALBUMIN 4.6 09/11/2023  0907   AST 18 09/11/2023 0907   ALT 14 09/11/2023 0907   ALKPHOS 53 09/11/2023 0907   BILITOT 0.2 09/11/2023 0907      Component Value Date/Time   TSH 0.798 09/11/2023 0907   Nutritional Lab Results  Component Value Date   VD25OH  42.7 09/11/2023   VD25OH 31.89 08/06/2023   VD25OH 26.25 (L) 07/26/2022    Attestations:   I, Jamie Bean , acting as a Stage manager for Jamie Sensor, DO., have compiled all relevant documentation for today's office visit on behalf of Jamie Sensor, DO, while in the presence of Jamie & McLennan, DO.  I have spent 46 minutes in the care of the patient today.  40 minutes was spent on face-to-face counseling and reviewing listed medical problems above as outlined in office visit note, providing nutritional and behavioral counseling as outlined in obesity care plan, independently interpreting results and goals of care, see listed medical problems, and discussing biometric information and progress. We reviewed her meal plan and discussed how the foods she's eating is affecting each one of her labs. Pt educated on why we want her to eat various foods in various amounts and has a better understanding of the nutritional plan because of this. All her questions were answered today. 6 minutes was spent on pre-chart review and additional documentation after the visit.   I have reviewed the above documentation for accuracy and completeness, and I agree with the above. Jamie Bean, D.O.  The 21st Century Cures Act was signed into law in 2016 which includes the topic of electronic health records.  This provides immediate access to information in MyChart.  This includes consultation notes, operative notes, office notes, lab results and pathology reports.  If you have any questions about what you read please let us  know at your next visit so we can discuss your concerns and take corrective action if need be.  We are right here with you.

## 2023-10-22 ENCOUNTER — Ambulatory Visit (INDEPENDENT_AMBULATORY_CARE_PROVIDER_SITE_OTHER): Admitting: Adult Health

## 2023-11-05 ENCOUNTER — Ambulatory Visit (INDEPENDENT_AMBULATORY_CARE_PROVIDER_SITE_OTHER): Admitting: Family Medicine

## 2023-11-05 ENCOUNTER — Encounter (INDEPENDENT_AMBULATORY_CARE_PROVIDER_SITE_OTHER): Payer: Self-pay | Admitting: Family Medicine

## 2023-11-05 VITALS — BP 110/71 | HR 66 | Temp 98.7°F | Ht 69.5 in | Wt 240.0 lb

## 2023-11-05 DIAGNOSIS — Z6834 Body mass index (BMI) 34.0-34.9, adult: Secondary | ICD-10-CM

## 2023-11-05 DIAGNOSIS — E559 Vitamin D deficiency, unspecified: Secondary | ICD-10-CM | POA: Diagnosis not present

## 2023-11-05 DIAGNOSIS — R7303 Prediabetes: Secondary | ICD-10-CM

## 2023-11-05 MED ORDER — METFORMIN HCL 500 MG PO TABS: ORAL_TABLET | ORAL | 1 refills | Status: AC

## 2023-11-05 MED ORDER — VITAMIN D (CHOLECALCIFEROL) 25 MCG (1000 UT) PO TABS
ORAL_TABLET | ORAL | Status: DC
Start: 2023-11-05 — End: 2024-02-27

## 2023-11-05 NOTE — Progress Notes (Signed)
 Jamie DOROTHA Bean, D.O.  ABFM, ABOM Specializing in Clinical Bariatric Medicine  Office located at: [address] [place], [place]  [address]   Assessment and Plan:   Medications Discontinued During This Encounter  Medication Reason   Vitamin D , Cholecalciferol , 25 MCG (1000 UT) TABS Reorder    Meds ordered this encounter  Medications   Vitamin D , Cholecalciferol , 25 MCG (1000 UT) TABS    Sig: 1000 IU daily   metFORMIN  (GLUCOPHAGE ) 500 MG tablet    Sig: 1/2 po with lunch and dinner daily    Dispense:  30 tablet    Refill:  1    30 d supply;  ** OV for RF **   Do not send RF request      FOR THE DISEASE OF OBESITY:  Morbid obesity starting (HCC) 36.4 BMI 34.0-34.9,adult current 34.95 Assessment & Plan: Since last office visit on 09/25/23 patient's muscle mass has decreased by 7.2 lbs. Fat mass has increased by 1.2 lbs. Total body water  has decreased by 1 lb.  Counseling done on how various foods will affect these numbers and how to maximize success  Total lbs lost to date: 10 lbs  Total weight loss percentage to date: -4.00 %     Recommended Dietary Goals Jamie Bean is currently in the action stage of change. As such, her goal is to continue weight management plan.  She has agreed to: continue current plan   Behavioral Intervention We discussed the following today: increasing lean protein intake to established goals, decreasing simple carbohydrates , and avoiding skipping meals  Additional resources provided today: Handout on the concepts of Adaptive Thermogenesis  Evidence-based interventions for health behavior change were utilized today including the discussion of self monitoring techniques, problem-solving barriers and SMART goal setting techniques.   Regarding patient's less desirable eating habits and patterns, we employed the technique of small changes.   Pt will specifically work on: add weight lifting to her exercise routine    Recommended Physical  Activity Goals Jamie Bean has been advised to work up to 300-450 minutes of moderate intensity aerobic activity a week and strengthening exercises 2-3 times per week for cardiovascular health, weight loss maintenance and preservation of muscle mass.   She has agreed to: Continue current level of physical activity  and incorporate weight lifting in exercise routine.    Pharmacotherapy We both agreed to: Continue with current nutritional and behavioral strategies and Start Metformin  if cleared by her ob/gyn AND her son's pediatrician.    ASSOCIATED CONDITIONS ADDRESSED TODAY:  Prediabetes -new onset Assessment & Plan: Lab Results  Component Value Date   HGBA1C 5.9 (H) 09/11/2023   HGBA1C 5.6 08/06/2023   HGBA1C 5.7 07/26/2022   INSULIN  7.8 09/11/2023   INSULIN  7.3 07/01/2018   INSULIN  6.9 02/04/2018   No meds currently. Diet/lifestyle approach. Not drinking adequate amounts of water  per day. Reports uncontrolled hunger/cravings/food noise; has a crash in the evenings.   Discussed initiating Metformin  to help with hunger/cravings. Has not been on Metformin  in the past. Currently breast feeds her son and is concerned about possible contraindications. Advised to discuss these concerns w her OB/GYN; will defer recommendation to her OB/GYN specialist. Risks/benefits reviewed. Will INITIATE Metformin  but pt instructed that she must first clear with OB/GYN and pediatrician prior to starting.     Vitamin D  deficiency Assessment & Plan: Lab Results  Component Value Date   VD25OH 42.7 09/11/2023   VD25OH 31.89 08/06/2023   VD25OH 26.25 (L) 07/26/2022  Pt not currently taking vit D supplementation. She is taking daily prenatal MVM w iron  and adds she is still breast feeding. Ideal vit D levels of 50-70 reviewed w pt.  Advised pt to CONTINUE pre-natal and START OTC Vitamin D  1,000 units daily. Continue monitoring    Follow up:   Return for 4 week f/u on 12/03/2023 @ 10:40 AM.. She was  informed of the importance of frequent follow up visits to maximize her success with intensive lifestyle modifications for her multiple health conditions.   Subjective:   Chief complaint: Obesity Jamie Bean is here to discuss her progress with her obesity treatment plan.  She is journaling 1350-1450 calories and 115++ grams of protein daily using the CAT 2 MP w/ 6 ounces of lean protein at lunch as a guide and states she is following her eating plan approximately 75% of the time. She states she is weight lifting 30 minutes 2 days per week and dancing 30 minutes 2 days per week.   Interval History:  Jamie Bean is here for a follow up office visit. Since last OV on 09/25/23, she is down 4 lbs. She has been struggling to meet her protein goal at dinner. Her husband tends to cook dinner for the family and at times does not prepare adequate amounts of protein for her dinner. Breakfast and lunch tend to be meals she meets her protein goals more consistently. For lunch she eats at least 6 ounces of protein; either in a sandwich with deli [place] breast or a salad with deli [place] breast. She reports an end of the day crash' after she gets home from work while waiting for dinner to be prepared. She tends to have increased hunger and cravings. When this occurs, she typically tries to be mindful of things snacks that are very carb heavy (chips) for the end of the day crash. She reports not drinking enough water . She has been intentional about exercising   Pharmacotherapy that aid with weight loss: She is currently taking no anti-obesity medication.   Review of Systems:  Pertinent positives were addressed with patient today.  Reviewed by clinician on day of visit: allergies, medications, problem list, medical history, surgical history, family history, social history, and previous encounter notes.  Weight Summary and Biometrics   Weight Lost Since Last Visit: 4lb  Weight Gained Since Last Visit:  0lb   Vitals Temp: 98.7 F (37.1 C) BP: 110/71 Pulse Rate: 66 SpO2: 98 %   Anthropometric Measurements Height: 5' 9.5 (1.765 m) Weight: 240 lb (108.9 kg) BMI (Calculated): 34.95 Weight at Last Visit: 244lb Weight Lost Since Last Visit: 4lb Weight Gained Since Last Visit: 0lb Starting Weight: 250lb Total Weight Loss (lbs): 10 lb (4.536 kg) Peak Weight: 260lb Waist Measurement : 45 inches   Body Composition  Body Fat %: 41.5 % Fat Mass (lbs): 99.8 lbs Muscle Mass (lbs): 133.8 lbs Total Body Water  (lbs): 91 lbs Visceral Fat Rating : 9   Other Clinical Data RMR: 1901 Fasting: No Labs: No Today's Visit #: 3 Starting Date: 09/11/23 Comments: third visit    Objective:   PHYSICAL EXAM: Blood pressure 110/71, pulse 66, temperature 98.7 F (37.1 C), height 5' 9.5 (1.765 m), weight 240 lb (108.9 kg), SpO2 98%, currently breastfeeding. Body mass index is 34.93 kg/m.  General: she is overweight, cooperative and in no acute distress. PSYCH: Has normal mood, affect and thought process.   HEENT: EOMI, sclerae are anicteric. Lungs: Normal breathing effort, no conversational dyspnea. Extremities: Moves *  4 Neurologic: A and O * 3, good insight  DIAGNOSTIC DATA REVIEWED: BMET    Component Value Date/Time   NA 139 09/11/2023 0907   K 4.3 09/11/2023 0907   CL 103 09/11/2023 0907   CO2 22 09/11/2023 0907   GLUCOSE 93 09/11/2023 0907   GLUCOSE 98 08/06/2023 1134   BUN 17 09/11/2023 0907   CREATININE 0.78 09/11/2023 0907   CREATININE 0.77 09/05/2019 1453   CALCIUM  9.6 09/11/2023 0907   GFRNONAA >60 05/17/2023 1842   GFRAA 130 04/06/2020 0837   Lab Results  Component Value Date   HGBA1C 5.9 (H) 09/11/2023   HGBA1C 5.6 08/28/2016   Lab Results  Component Value Date   INSULIN  7.8 09/11/2023   INSULIN  5.8 10/09/2017   Lab Results  Component Value Date   TSH 0.798 09/11/2023   CBC    Component Value Date/Time   WBC 6.0 09/11/2023 0907   WBC 6.1  08/06/2023 1134   RBC 4.77 09/11/2023 0907   RBC 4.51 08/06/2023 1134   HGB 12.8 09/11/2023 0907   HCT 38.9 09/11/2023 0907   PLT 194 09/11/2023 0907   MCV 82 09/11/2023 0907   MCH 26.8 09/11/2023 0907   MCH 27.4 05/17/2023 1842   MCHC 32.9 09/11/2023 0907   MCHC 32.9 08/06/2023 1134   RDW 13.1 09/11/2023 0907   Iron  Studies    Component Value Date/Time   IRON  90 07/26/2022 1540   TIBC 295.4 07/26/2022 1540   FERRITIN 44.8 07/26/2022 1540   IRONPCTSAT 30.5 07/26/2022 1540   IRONPCTSAT 47 (H) 09/09/2020 0941   Lipid Panel     Component Value Date/Time   CHOL 194 09/11/2023 0907   TRIG 61 09/11/2023 0907   HDL 53 09/11/2023 0907   CHOLHDL 3.7 09/11/2023 0907   CHOLHDL 4 08/06/2023 1134   VLDL 8.4 08/06/2023 1134   LDLCALC 130 (H) 09/11/2023 0907   LDLCALC 118 (H) 09/05/2019 1453   Hepatic Function Panel     Component Value Date/Time   PROT 7.4 09/11/2023 0907   ALBUMIN 4.6 09/11/2023 0907   AST 18 09/11/2023 0907   ALT 14 09/11/2023 0907   ALKPHOS 53 09/11/2023 0907   BILITOT 0.2 09/11/2023 0907      Component Value Date/Time   TSH 0.798 09/11/2023 0907   Nutritional Lab Results  Component Value Date   VD25OH 42.7 09/11/2023   VD25OH 31.89 08/06/2023   VD25OH 26.25 (L) 07/26/2022    Attestations:   Jamie Bean, acting as a medical scribe for Jamie Jenkins, DO., have compiled all relevant documentation for today's office visit on behalf of Jamie Jenkins, DO, while in the presence of Jamie [employer] McLennan, DO.  Reviewed by clinician on day of visit: allergies, medications, problem list, medical history, surgical history, family history, social history, and previous encounter notes pertinent to patient's obesity diagnosis.  I have reviewed the above documentation for accuracy and completeness, and I agree with the above. Jamie Bean, D.O.  The 21st Century Cures Act was signed into law in 2016 which includes the topic of electronic health records.   This provides immediate access to information in MyChart.  This includes consultation notes, operative notes, office notes, lab results and pathology reports.  If you have any questions about what you read please let us  know at your next visit so we can discuss your concerns and take corrective action if need be.  We are right here with you.

## 2023-11-21 ENCOUNTER — Ambulatory Visit (INDEPENDENT_AMBULATORY_CARE_PROVIDER_SITE_OTHER): Admitting: Family Medicine

## 2023-12-03 ENCOUNTER — Encounter (INDEPENDENT_AMBULATORY_CARE_PROVIDER_SITE_OTHER): Payer: Self-pay | Admitting: Family Medicine

## 2023-12-03 ENCOUNTER — Ambulatory Visit (INDEPENDENT_AMBULATORY_CARE_PROVIDER_SITE_OTHER): Admitting: Family Medicine

## 2023-12-03 VITALS — BP 111/75 | HR 71 | Temp 98.7°F | Ht 69.5 in | Wt 235.0 lb

## 2023-12-03 DIAGNOSIS — Z6834 Body mass index (BMI) 34.0-34.9, adult: Secondary | ICD-10-CM | POA: Diagnosis not present

## 2023-12-03 DIAGNOSIS — R7303 Prediabetes: Secondary | ICD-10-CM | POA: Diagnosis not present

## 2023-12-03 DIAGNOSIS — E559 Vitamin D deficiency, unspecified: Secondary | ICD-10-CM

## 2023-12-03 NOTE — Progress Notes (Signed)
 Jamie Bean, D.O.  ABFM, ABOM Specializing in Clinical Bariatric Medicine  Office located at: [address] [place], [place]  [address]   Assessment and Plan:   FOR THE DISEASE OF OBESITY:  BMI 34.0-34.9,adult current 34.21 Morbid obesity starting (HCC) 36.4 Assessment & Plan: Since last office visit on 11/05/2023 patient's muscle mass has decreased by 6 lbs. Fat mass has increased by 1.4 lbs. Total body water  has decreased by 0.2 lbs.  Counseling done on how various foods will affect these numbers and how to maximize success  Total lbs lost to date: -15  lbs Total weight loss percentage to date: -6.00  %   Recommended Dietary Goals [name] is currently in the action stage of change. As such, her goal is to continue weight management plan.  She has agreed to: continue current plan   Behavioral Intervention We discussed the following today: continue to work on tracking daily calories and grams of protein, increasing lean protein intake to established goals, continue to work on implementation of reduced calorie nutritional plan, and staying on track while traveling and vacationing  Additional resources provided today: Handout on balanced plate concepts.  , Handout on complex carbohydrates and lean sources of protein, Handout on reduced calorie nutrition plan concepts, and Handout on Daily Food Journaling Log, Handout on non-starchy vegetables.   Evidence-based interventions for health behavior change were utilized today including the discussion of self monitoring techniques, problem-solving barriers and SMART goal setting techniques.   Regarding patient's less desirable eating habits and patterns, we employed the technique of small changes.   Pt's goal is to journal most days of the week.   Recommended Physical Activity Goals [name] has been advised to work up to 300-450 minutes of moderate intensity aerobic activity a week and strengthening exercises 2-3 times per  week for cardiovascular health, weight loss maintenance and preservation of muscle mass.   She has agreed to: continue to gradually increase the amount and intensity of exercise routine   Pharmacotherapy Start Metformin  (see Pre-DM note). Pt also encouraged to contact insurance regarding AOM coverage.   ASSOCIATED CONDITIONS ADDRESSED TODAY:   Prediabetes Assessment & Plan: Lab Results  Component Value Date   HGBA1C 5.9 (H) 09/11/2023   HGBA1C 5.6 08/06/2023   HGBA1C 5.7 07/26/2022   INSULIN  7.8 09/11/2023   INSULIN  7.3 07/01/2018   INSULIN  6.9 02/04/2018    She was cleared by her Ob/gyn and son's pediatrician to start Metformin , however patient has not yet. Most days her hunger and cravings are controlled, but some days she does experience them when she is low in her protein intake or not eating regularly.   - After reviewing risks/benefits, pt is agreeable to initiating Metformin  (start with 0.5 tablet with lunch for 3-4 days and if tolerating well, add 0.5 tablet at dinner). - Reminded her to eat regularly (every 3-5 hours)  - Reminded patient that having adequate amounts of protein with each meal is important for increasing muscle mass, stabilizing sugars, controlling hunger and cravings, and improving thermogenesis.  - Continue working on nutrition plan to decrease simple carbohydrates, increase lean proteins and exercise to promote weight loss and improve glycemic control and prevent progression to T2DM.    Vitamin D  deficiency Assessment & Plan: Lab Results  Component Value Date   VD25OH 42.7 09/11/2023   VD25OH 31.89 08/06/2023   VD25OH 26.25 (L) 07/26/2022   Reports compliance with prenatal MVM w/ Iron  and OTC Vit D 1,000 units daily. No  acute concerns.   - Continue all supplementation.  - Recheck Vit D levels in 1-2 months     Follow up:   Return 12/31/2023 at 12:20 PM.  She was informed of the importance of frequent follow up visits to maximize her success  with intensive lifestyle modifications for her multiple health conditions.   Subjective:   Chief complaint: Obesity [name] is here to discuss her progress with her obesity treatment plan. She is journaling 1350-1450 calories and 115++ grams of protein daily using the CAT 2 MP w/ 6 ounces of lean protein at lunch as a guide and states she is following her eating plan approximately 75% of the time. She states she is doing weight lifting or cardio 30 minutes 5 days per week.   Interval History:  Jamie Bean is here for a follow up office visit. States she has been more protein focused. Has also been journaling more and is trying to be more consistent with her parameters. She is down 5 lbs since LOV on 11/05/2023.    Pharmacotherapy that aid with weight loss: none   Review of Systems:  Pertinent positives were addressed with patient today.  Reviewed by clinician on day of visit: allergies, medications, problem list, medical history, surgical history, family history, social history, and previous encounter notes.  Weight Summary and Biometrics   Weight Lost Since Last Visit: 5lb  Weight Gained Since Last Visit: 0lb    Vitals Temp: 98.7 F (37.1 C) BP: 111/75 Pulse Rate: 71 SpO2: 98 %   Anthropometric Measurements Height: 5' 9.5 (1.765 m) Weight: 235 lb (106.6 kg) BMI (Calculated): 34.22 Weight at Last Visit: 240lb Weight Lost Since Last Visit: 5lb Weight Gained Since Last Visit: 0lb Starting Weight: 250lb Total Weight Loss (lbs): 15 lb (6.804 kg) Peak Weight: 260lb Waist Measurement : 45 inches   Body Composition  Body Fat %: 42.9 % Fat Mass (lbs): 101.2 lbs Muscle Mass (lbs): 127.8 lbs Total Body Water  (lbs): 90.8 lbs Visceral Fat Rating : 10   Other Clinical Data RMR: 1901 Fasting: No Labs: no Today's Visit #: 4 Starting Date: 09/11/23 Comments: Cat2/1350-1450/115+/6oz    Objective:   PHYSICAL EXAM: Blood pressure 111/75, pulse 71, temperature  98.7 F (37.1 C), height 5' 9.5 (1.765 m), weight 235 lb (106.6 kg), SpO2 98%, currently breastfeeding. Body mass index is 34.21 kg/m.  General: she is overweight, cooperative and in no acute distress. PSYCH: Has normal mood, affect and thought process.   HEENT: EOMI, sclerae are anicteric. Lungs: Normal breathing effort, no conversational dyspnea. Extremities: Moves * 4 Neurologic: A and O * 3, good insight  DIAGNOSTIC DATA REVIEWED: BMET    Component Value Date/Time   NA 139 09/11/2023 0907   K 4.3 09/11/2023 0907   CL 103 09/11/2023 0907   CO2 22 09/11/2023 0907   GLUCOSE 93 09/11/2023 0907   GLUCOSE 98 08/06/2023 1134   BUN 17 09/11/2023 0907   CREATININE 0.78 09/11/2023 0907   CREATININE 0.77 09/05/2019 1453   CALCIUM  9.6 09/11/2023 0907   GFRNONAA >60 05/17/2023 1842   GFRAA 130 04/06/2020 0837   Lab Results  Component Value Date   HGBA1C 5.9 (H) 09/11/2023   HGBA1C 5.6 08/28/2016   Lab Results  Component Value Date   INSULIN  7.8 09/11/2023   INSULIN  5.8 10/09/2017   Lab Results  Component Value Date   TSH 0.798 09/11/2023   CBC    Component Value Date/Time   WBC 6.0 09/11/2023 0907   WBC  6.1 08/06/2023 1134   RBC 4.77 09/11/2023 0907   RBC 4.51 08/06/2023 1134   HGB 12.8 09/11/2023 0907   HCT 38.9 09/11/2023 0907   PLT 194 09/11/2023 0907   MCV 82 09/11/2023 0907   MCH 26.8 09/11/2023 0907   MCH 27.4 05/17/2023 1842   MCHC 32.9 09/11/2023 0907   MCHC 32.9 08/06/2023 1134   RDW 13.1 09/11/2023 0907   Iron  Studies    Component Value Date/Time   IRON  90 07/26/2022 1540   TIBC 295.4 07/26/2022 1540   FERRITIN 44.8 07/26/2022 1540   IRONPCTSAT 30.5 07/26/2022 1540   IRONPCTSAT 47 (H) 09/09/2020 0941   Lipid Panel     Component Value Date/Time   CHOL 194 09/11/2023 0907   TRIG 61 09/11/2023 0907   HDL 53 09/11/2023 0907   CHOLHDL 3.7 09/11/2023 0907   CHOLHDL 4 08/06/2023 1134   VLDL 8.4 08/06/2023 1134   LDLCALC 130 (H) 09/11/2023 0907    LDLCALC 118 (H) 09/05/2019 1453   Hepatic Function Panel     Component Value Date/Time   PROT 7.4 09/11/2023 0907   ALBUMIN 4.6 09/11/2023 0907   AST 18 09/11/2023 0907   ALT 14 09/11/2023 0907   ALKPHOS 53 09/11/2023 0907   BILITOT 0.2 09/11/2023 0907      Component Value Date/Time   TSH 0.798 09/11/2023 0907   Nutritional Lab Results  Component Value Date   VD25OH 42.7 09/11/2023   VD25OH 31.89 08/06/2023   VD25OH 26.25 (L) 07/26/2022    Attestations:   I, Special Puri , acting as a Stage manager for Jamie Jenkins, DO., have compiled all relevant documentation for today's office visit on behalf of Jamie Jenkins, DO, while in the presence of [employer], DO.  I have spent 43 minutes in the care of the patient today including 35 minutes face-to-face assessing and reviewing listed medical problems above as outlined in office visit note and providing nutritional and behavioral counseling as outlined in obesity care plan.   I have reviewed the above documentation for accuracy and completeness, and I agree with the above. Jamie JINNY Bean, D.O.  The 21st Century Cures Act was signed into law in 2016 which includes the topic of electronic health records.  This provides immediate access to information in MyChart.  This includes consultation notes, operative notes, office notes, lab results and pathology reports.  If you have any questions about what you read please let us  know at your next visit so we can discuss your concerns and take corrective action if need be.  We are right here with you.

## 2023-12-25 ENCOUNTER — Ambulatory Visit (INDEPENDENT_AMBULATORY_CARE_PROVIDER_SITE_OTHER): Admitting: Family Medicine

## 2023-12-31 ENCOUNTER — Ambulatory Visit (INDEPENDENT_AMBULATORY_CARE_PROVIDER_SITE_OTHER): Admitting: Family Medicine

## 2024-01-12 ENCOUNTER — Other Ambulatory Visit (INDEPENDENT_AMBULATORY_CARE_PROVIDER_SITE_OTHER): Payer: Self-pay | Admitting: Family Medicine

## 2024-01-15 ENCOUNTER — Encounter (INDEPENDENT_AMBULATORY_CARE_PROVIDER_SITE_OTHER): Payer: Self-pay | Admitting: Family Medicine

## 2024-01-15 ENCOUNTER — Ambulatory Visit (INDEPENDENT_AMBULATORY_CARE_PROVIDER_SITE_OTHER): Admitting: Family Medicine

## 2024-01-15 VITALS — BP 107/74 | HR 77 | Temp 98.3°F | Ht 69.5 in | Wt 227.0 lb

## 2024-01-15 DIAGNOSIS — E559 Vitamin D deficiency, unspecified: Secondary | ICD-10-CM

## 2024-01-15 DIAGNOSIS — Z6833 Body mass index (BMI) 33.0-33.9, adult: Secondary | ICD-10-CM | POA: Diagnosis not present

## 2024-01-15 DIAGNOSIS — R7303 Prediabetes: Secondary | ICD-10-CM

## 2024-01-15 DIAGNOSIS — Z6834 Body mass index (BMI) 34.0-34.9, adult: Secondary | ICD-10-CM

## 2024-01-15 NOTE — Progress Notes (Signed)
 Jamie Bean, D.O.  ABFM, ABOM Specializing in Clinical Bariatric Medicine  Office located at: [address] [place], [place]  [address]   Assessment and Plan:  No orders of the defined types were placed in this encounter.   There are no discontinued medications.   No orders of the defined types were placed in this encounter.     FOR THE DISEASE OF OBESITY:  *** Assessment & Plan: Since last office visit on 12/03/23 patient's muscle mass has increased by 0.4 lbs. Fat mass has decreased by 6.2 lbs. Total body water  has decreased by 3.8 lbs.  Body fat % has decreased by 2.4 %. Counseling done on how various foods will affect these numbers and how to maximize success  Total lbs lost to date: -  23 lbs Total weight loss percentage to date: -9.20  %   Recommended Dietary Goals Jamie Bean is currently in the action stage of change. As such, her goal is to continue weight management plan.  She has agreed to: {EMWTLOSSPLAN:29297::continue current plan}   Behavioral Intervention We discussed the following today: {dowtlossstrategies:31654} -meal prep (long life meal prep ,  factor meals ) - injectables    Additional resources provided today: {DOhandouts:31655::None} - handout of long life meal locations - healthy pasta  Evidence-based interventions for health behavior change were utilized today including the discussion of self monitoring techniques, problem-solving barriers and SMART goal setting techniques.   Regarding patient's less desirable eating habits and patterns, we employed the technique of small changes.   Goal(s) for next OV: ***    Recommended Physical Activity Goals Jamie Bean has been advised to work up to 300-450 minutes of moderate intensity aerobic activity a week and strengthening exercises 2-3 times per week for cardiovascular health, weight loss maintenance and preservation of muscle mass.   She has agreed to: {EMEXERCISE:28847::Think about  enjoyable ways to increase daily physical activity and overcoming barriers to exercise,Increase physical activity in their day and reduce sedentary time (increase NEAT).,Increase volume of physical activity to a goal of 240 minutes a week,Combine aerobic and strengthening exercises for efficiency and improved cardiometabolic health.}   Pharmacotherapy We both agreed to: Continue with current nutritional and behavioral strategies   ASSOCIATED CONDITIONS ADDRESSED TODAY: Vitamin D  deficiency Assessment & Plan Lab Results  Component Value Date   VD25OH 42.7 09/11/2023   VD25OH 31.89 08/06/2023   VD25OH 26.25 (L) 07/26/2022  Pt is taking OTC Vit D supplements with good compliance. No side effects.Continue taking.     Prediabetes Assessment & Plan  Lab Results  Component Value Date   HGBA1C 5.9 (H) 09/11/2023   HGBA1C 5.6 08/06/2023   HGBA1C 5.7 07/26/2022   INSULIN  7.8 09/11/2023   INSULIN  7.3 07/01/2018   INSULIN  6.9 02/04/2018   - Not taking the metformin , after speaking to her OBGYN due to breastfeeding is holding off til after her son is done breast feeding  - she is having some craving currently due to not eating properly  - feels like her craving and her hunger is well controlled when she eats proper food  - counsed on etaing proper protein intake to help her feel less hungry  *** - will take labs    ***    Follow up:   No follow-ups on file. *** She was informed of the importance of frequent follow up visits to maximize her success with intensive lifestyle modifications for her multiple health conditions.    Subjective:    Chief complaint:  Obesity Jamie Bean is here to discuss her progress with her obesity treatment plan. She is  journaling 1350-1450 calories and 115++ grams of protein daily using the CAT 2 MP w/ 6 ounces of lean protein at lunch as a guide and states she is following her eating plan approximately 75% of the time. Pt is weight lifting and  doing zumba 20-30 minutes 4 days per week    Interval History:  Jamie Bean is here for a follow up office visit. Pt has experienced a weight loss of 8 lbs since last OV on 12/03/23. 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 S   -did not hit goal of weight for her 38th but she is okay with that  Did journal for a week and was helping to think of the claories  Over 400  Mindufl of snaocking outside of meal time  (With her kids snakcs just popping in )  When she didn't eat all her protein she felt protein and was snacking a lot more      Pharmacotherapy that aid with weight loss: She is currently not taking any medication.   Review of Systems:  Pertinent positives were addressed with patient today.  Reviewed by clinician on day of visit: allergies, medications, problem list, medical history, surgical history, family history, social history, and previous encounter notes.  Weight Summary and Biometrics   Weight Lost Since Last Visit: 8lb  Weight Gained Since Last Visit: 0  ***  Vitals Temp: 98.3 F (36.8 C) BP: 107/74 Pulse Rate: 77 SpO2: 98 %   Anthropometric Measurements Height: 5' 9.5 (1.765 m) Weight: 227 lb (103 kg) BMI (Calculated): 33.05 Weight at Last Visit: 235lb Weight Lost Since Last Visit: 8lb Weight Gained Since Last Visit: 0 Starting Weight: 250lb Total Weight Loss (lbs): 23 lb (10.4 kg) Peak Weight: 260lb   Body Composition  Body Fat %: 40.5 % Fat Mass (lbs): 95 lbs Muscle Mass (lbs): 128.2 lbs Total Body Water  (lbs): 87 lbs Visceral Fat Rating : 9   Other Clinical Data Fasting: no Labs: no Today's Visit #: 5 Starting Date: 09/11/23    Objective:   PHYSICAL EXAM: Blood pressure 107/74, pulse 77,  temperature 98.3 F (36.8 C), height 5' 9.5 (1.765 m), weight 227 lb (103 kg), SpO2 98%, currently breastfeeding. Body mass index is 33.04 kg/m.  General: she is overweight, cooperative and in no acute distress. PSYCH: Has normal mood, affect and thought process.   HEENT: EOMI, sclerae are anicteric. Lungs: Normal breathing effort, no conversational dyspnea. Extremities: Moves * 4 Neurologic: A and O * 3, good insight  DIAGNOSTIC DATA REVIEWED: BMET    Component Value Date/Time   NA 139 09/11/2023 0907   K 4.3 09/11/2023 0907   CL 103 09/11/2023 0907   CO2 22 09/11/2023 0907   GLUCOSE 93 09/11/2023 0907   GLUCOSE 98 08/06/2023 1134   BUN 17 09/11/2023 0907   CREATININE 0.78 09/11/2023 0907   CREATININE 0.77 09/05/2019 1453   CALCIUM  9.6 09/11/2023 0907   GFRNONAA >60 05/17/2023 1842   GFRAA 130 04/06/2020 0837   Lab Results  Component Value Date   HGBA1C 5.9 (H) 09/11/2023   HGBA1C 5.6 08/28/2016   Lab Results  Component Value Date   INSULIN   7.8 09/11/2023   INSULIN  5.8 10/09/2017   Lab Results  Component Value Date   TSH 0.798 09/11/2023   CBC    Component Value Date/Time   WBC 6.0 09/11/2023 0907   WBC 6.1 08/06/2023 1134   RBC 4.77 09/11/2023 0907   RBC 4.51 08/06/2023 1134   HGB 12.8 09/11/2023 0907   HCT 38.9 09/11/2023 0907   PLT 194 09/11/2023 0907   MCV 82 09/11/2023 0907   MCH 26.8 09/11/2023 0907   MCH 27.4 05/17/2023 1842   MCHC 32.9 09/11/2023 0907   MCHC 32.9 08/06/2023 1134   RDW 13.1 09/11/2023 0907   Iron  Studies    Component Value Date/Time   IRON  90 07/26/2022 1540   TIBC 295.4 07/26/2022 1540   FERRITIN 44.8 07/26/2022 1540   IRONPCTSAT 30.5 07/26/2022 1540   IRONPCTSAT 47 (H) 09/09/2020 0941   Lipid Panel     Component Value Date/Time   CHOL 194 09/11/2023 0907   TRIG 61 09/11/2023 0907   HDL 53 09/11/2023 0907   CHOLHDL 3.7 09/11/2023 0907   CHOLHDL 4 08/06/2023 1134   VLDL 8.4 08/06/2023 1134   LDLCALC 130 (H)  09/11/2023 0907   LDLCALC 118 (H) 09/05/2019 1453   Hepatic Function Panel     Component Value Date/Time   PROT 7.4 09/11/2023 0907   ALBUMIN 4.6 09/11/2023 0907   AST 18 09/11/2023 0907   ALT 14 09/11/2023 0907   ALKPHOS 53 09/11/2023 0907   BILITOT 0.2 09/11/2023 0907      Component Value Date/Time   TSH 0.798 09/11/2023 0907   Nutritional Lab Results  Component Value Date   VD25OH 42.7 09/11/2023   VD25OH 31.89 08/06/2023   VD25OH 26.25 (L) 07/26/2022    Attestations:   I, ***, acting as a medical scribe for Jamie Jenkins, DO., have compiled all relevant documentation for today's office visit on behalf of Jamie Jenkins, DO, while in the presence of [employer], DO.  I have spent *** minutes in the care of the patient today including *** minutes face-to-face assessing and reviewing listed medical problems above as outlined in office visit note and providing nutritional and behavioral counseling as outlined in obesity care plan.   I have reviewed the above documentation for accuracy and completeness, and I agree with the above. Jamie JINNY Bean, D.O.  The 21st Century Cures Act was signed into law in 2016 which includes the topic of electronic health records.  This provides immediate access to information in MyChart.  This includes consultation notes, operative notes, office notes, lab results and pathology reports.  If you have any questions about what you read please let us  know at your next visit so we can discuss your concerns and take corrective action if need be.  We are right here with you.

## 2024-02-05 ENCOUNTER — Ambulatory Visit (INDEPENDENT_AMBULATORY_CARE_PROVIDER_SITE_OTHER): Admitting: Family Medicine

## 2024-02-27 ENCOUNTER — Encounter (INDEPENDENT_AMBULATORY_CARE_PROVIDER_SITE_OTHER): Payer: Self-pay | Admitting: Family Medicine

## 2024-02-27 ENCOUNTER — Ambulatory Visit (INDEPENDENT_AMBULATORY_CARE_PROVIDER_SITE_OTHER): Admitting: Family Medicine

## 2024-02-27 DIAGNOSIS — Z6832 Body mass index (BMI) 32.0-32.9, adult: Secondary | ICD-10-CM

## 2024-02-27 DIAGNOSIS — R7303 Prediabetes: Secondary | ICD-10-CM | POA: Diagnosis not present

## 2024-02-27 DIAGNOSIS — Z6834 Body mass index (BMI) 34.0-34.9, adult: Secondary | ICD-10-CM

## 2024-02-27 DIAGNOSIS — F5089 Other specified eating disorder: Secondary | ICD-10-CM | POA: Diagnosis not present

## 2024-02-27 DIAGNOSIS — E559 Vitamin D deficiency, unspecified: Secondary | ICD-10-CM | POA: Diagnosis not present

## 2024-02-27 DIAGNOSIS — E7841 Elevated Lipoprotein(a): Secondary | ICD-10-CM

## 2024-02-27 MED ORDER — VITAMIN D (CHOLECALCIFEROL) 25 MCG (1000 UT) PO TABS: ORAL_TABLET | ORAL | Status: DC

## 2024-02-27 NOTE — Progress Notes (Signed)
 Jamie Bean, D.O.  ABFM, ABOM Specializing in Clinical Bariatric Medicine  Office located at: [address] [place], [place]  [address]    FOR THE CHRONIC DISEASE OF OBESITY:   Morbid obesity starting (HCC) 36.4; BMI 34.0-34.9,adult current 32.47  Weight Summary and Body Composition Analysis  Weight Lost Since Last Visit: 4lb  Weight Gained Since Last Visit: 0    Vitals Temp: 98.4 F (36.9 C) BP: 109/72 Pulse Rate: 60 SpO2: 98 %   Anthropometric Measurements Height: 5' 9.5 (1.765 m) Weight: 223 lb (101.2 kg) BMI (Calculated): 32.47 Weight at Last Visit: 227lb Weight Lost Since Last Visit: 4lb Weight Gained Since Last Visit: 0 Starting Weight: 250lb Total Weight Loss (lbs): 27 lb (12.2 kg) Peak Weight: 260lb   Body Composition  Body Fat %: 40.9 % Fat Mass (lbs): 91.2 lbs Muscle Mass (lbs): 125.2 lbs Total Body Water  (lbs): 87 lbs Visceral Fat Rating : 9   Other Clinical Data Fasting: yes Labs: yes Today's Visit #: 6 Starting Date: 09/11/23    Chief complaint: Obesity  Interval History Jamie Bean is here for a follow-up office visit to discuss her progress with her obesity treatment plan. She is keeping a food journal and adhering to recommended goals of 1450 calories and 115+ grams protein with the CAT 2 MP with 6 ounces of lean protein at lunch as a guide and states she is following her eating plan approximately 90 % of the time. She is weight lifting and doing Zumba 20-30  minutes 4 days per week  She has experienced a weight loss of 4 lbs since last OV on 01/15/2024.   Her dietary and life habits include:  - Tracking Calories/Macros: not consistent with tracking at this time  - Eating More Whole Foods: yes  - Adequate Protein Intake: yes -she reports measuring her protein intake for lunch but not for dinner.  - Adequate Water  Intake: yes  - Skipping Meals: no  - Sleeping 7-9 Hours/ Night: yes    01/15/24 11:00 02/27/24  09:00   Body Fat % 40.5 % 40.9 %  Muscle Mass (lbs) 128.2 lbs 125.2 lbs  Fat Mass (lbs) 95 lbs 91.2 lbs  Total Body Water  (lbs) 87 lbs 87 lbs  Visceral Fat Rating  9 9   Counseling done on how various foods will affect these numbers and how to maximize success  Total lbs lost to date: - 27 lbs Total Fat Mass lost to date:  - 8  lbs Total weight loss percentage to date: -10.80 %   Recommended Dietary Goals Jamie Bean is currently in the action stage of change. As such, her goal is to continue weight management plan.  She has agreed to continue journaling 1450 cal and 115 + grams protein.   Nutritional and Behavioral Counseling:  We discussed the following today: work on meal planning and preparation, work on tracking and journaling calories using tracking application, practice mindfulness eating and understand the difference between hunger signals and cravings, eating multiple small meals a day to get in all their foods, and discussed pre-packaged healthier meals such as  [employer]life meal prep.  Additional resources provided today: Handout on protein equivalents of 2 ounces of meat or seafood and Handout on Daily Food Journaling Log  Evidence-based interventions for health behavior change were utilized today including the discussion of self monitoring techniques, problem-solving barriers and SMART goal setting techniques.   Regarding patient's less desirable eating habits and patterns, we employed the technique  of small changes.   SMART Goal(s) created today: use distraction strategies if desire to boredom eat arises + journal at least 3 days a week   Recommended Physical Activity Goals Jamie Bean has been advised to work up to 300-450 minutes of moderate intensity aerobic activity a week and strengthening exercises 2-3 times per week for cardiovascular health, weight loss maintenance and preservation of muscle mass.   She was encouraged to Continue to gradually increase the amount and  intensity of exercise routine   Medical Interventions and Pharmacotherapy Previous Bariatric surgery: n/a Pharmacotherapy: See Pre-DM note.    OBESITY RELATED CONDITIONS ADDRESSED TODAY:   Orders Placed This Encounter  Procedures   Hemoglobin A1c   Lipid panel   Insulin , random   VITAMIN D  25 Hydroxy (Vit-D Deficiency, Fractures)   Comprehensive metabolic panel with GFR    Medications Discontinued During This Encounter  Medication Reason   Vitamin D , Cholecalciferol , 25 MCG (1000 UT) TABS Reorder     Meds ordered this encounter  Medications   Vitamin D , Cholecalciferol , 25 MCG (1000 UT) TABS    Sig: 1000 IU daily     Prediabetes Assessment & Plan: Lab Results  Component Value Date   HGBA1C 5.9 (H) 09/11/2023   HGBA1C 5.6 08/06/2023   HGBA1C 5.7 07/26/2022   INSULIN  7.8 09/11/2023   INSULIN  7.3 07/01/2018   INSULIN  6.9 02/04/2018   She plans to start Metformin  at the beginning of next year as she is still weaning off breast feeding. Some days she feels slightly more hungry in the afternoons. At this time, she will work on increasing protein intake for hunger suppression and reducing simple and added sugars in her diet. Continue working on nutrition plan and exercise to promote weight loss and improve glycemic control and prevent progression to T2DM. Recheck labs today.    Vitamin D  deficiency Assessment & Plan: Lab Results  Component Value Date   VD25OH 42.7 09/11/2023   VD25OH 31.89 08/06/2023   VD25OH 26.25 (L) 07/26/2022   Currently on OTC Vit D 1000 lU daily with good compliance and tolerance. No acute concerns. Continue regimen and weight loss efforts. Recheck levels today.    Elevated lipoprotein(a) Assessment & Plan: Lab Results  Component Value Date   CHOL 194 09/11/2023   HDL 53 09/11/2023   LDLCALC 130 (H) 09/11/2023   TRIG 61 09/11/2023   CHOLHDL 3.7 09/11/2023   Condition managed with dietary and life style interventions. Most recent  lipid panel demonstrated elevated LDL at 130. Goal LDL < 100. Elevated LDL may be secondary to nutrition, genetics and spillover effect from excess adiposity. Continue to maintain diet low in saturated and trans fats. Recheck lipid panel today.    Other disorder of eating- emotional eating Assessment & Plan: She has a history of emotional eating tendencies especially when bored. Discussed mindfulness strategies to include doing a distraction activity (read a book, take a walk, talk with a friend, etc.) for 10-15 minutes, if she desires to snack because of boredom. Consider Dr.Barker referral in the future. Consider medications if needed in the future.    Objective:   PHYSICAL EXAM: Blood pressure 109/72, pulse 60, temperature 98.4 F (36.9 C), height 5' 9.5 (1.765 m), weight 223 lb (101.2 kg), last menstrual period 12/01/2023, SpO2 98%, currently breastfeeding. Body mass index is 32.46 kg/m.  General: she is overweight, cooperative and in no acute distress. PSYCH: Has normal mood, affect and thought process.   HEENT: EOMI, sclerae are anicteric. Lungs: Normal  breathing effort, no conversational dyspnea. Extremities: Moves * 4 Neurologic: A and O * 3, good insight  DIAGNOSTIC DATA REVIEWED: BMET    Component Value Date/Time   NA 139 09/11/2023 0907   K 4.3 09/11/2023 0907   CL 103 09/11/2023 0907   CO2 22 09/11/2023 0907   GLUCOSE 93 09/11/2023 0907   GLUCOSE 98 08/06/2023 1134   BUN 17 09/11/2023 0907   CREATININE 0.78 09/11/2023 0907   CREATININE 0.77 09/05/2019 1453   CALCIUM  9.6 09/11/2023 0907   GFRNONAA >60 05/17/2023 1842   GFRAA 130 04/06/2020 0837   Lab Results  Component Value Date   HGBA1C 5.9 (H) 09/11/2023   HGBA1C 5.6 08/28/2016   Lab Results  Component Value Date   INSULIN  7.8 09/11/2023   INSULIN  5.8 10/09/2017   Lab Results  Component Value Date   TSH 0.798 09/11/2023   CBC    Component Value Date/Time   WBC 6.0 09/11/2023 0907   WBC 6.1  08/06/2023 1134   RBC 4.77 09/11/2023 0907   RBC 4.51 08/06/2023 1134   HGB 12.8 09/11/2023 0907   HCT 38.9 09/11/2023 0907   PLT 194 09/11/2023 0907   MCV 82 09/11/2023 0907   MCH 26.8 09/11/2023 0907   MCH 27.4 05/17/2023 1842   MCHC 32.9 09/11/2023 0907   MCHC 32.9 08/06/2023 1134   RDW 13.1 09/11/2023 0907   Iron  Studies    Component Value Date/Time   IRON  90 07/26/2022 1540   TIBC 295.4 07/26/2022 1540   FERRITIN 44.8 07/26/2022 1540   IRONPCTSAT 30.5 07/26/2022 1540   IRONPCTSAT 47 (H) 09/09/2020 0941   Lipid Panel     Component Value Date/Time   CHOL 194 09/11/2023 0907   TRIG 61 09/11/2023 0907   HDL 53 09/11/2023 0907   CHOLHDL 3.7 09/11/2023 0907   CHOLHDL 4 08/06/2023 1134   VLDL 8.4 08/06/2023 1134   LDLCALC 130 (H) 09/11/2023 0907   LDLCALC 118 (H) 09/05/2019 1453   Hepatic Function Panel     Component Value Date/Time   PROT 7.4 09/11/2023 0907   ALBUMIN 4.6 09/11/2023 0907   AST 18 09/11/2023 0907   ALT 14 09/11/2023 0907   ALKPHOS 53 09/11/2023 0907   BILITOT 0.2 09/11/2023 0907      Component Value Date/Time   TSH 0.798 09/11/2023 0907   Nutritional Lab Results  Component Value Date   VD25OH 42.7 09/11/2023   VD25OH 31.89 08/06/2023   VD25OH 26.25 (L) 07/26/2022     Follow up:   Return 03/18/2024 at 10:20 AM.  She was informed of the importance of frequent follow up visits to maximize her success with intensive lifestyle modifications for her multiple health conditions.  Jamie Bean is aware that we will review all of her lab results at our next visit together in person.  She is aware that if anything is critical/ life threatening with the results, we will be contacting her via MyChart or by my CMA will be calling them prior to the office visit to discuss acute management.     Attestations:   I, Special Puri, acting as a stage manager for [employer], DO., have compiled all relevant documentation for today's office visit on  behalf of Jamie Jenkins, DO, while in the presence of [employer], DO.  Pertinent positives were addressed with patient today. Reviewed by clinician on day of visit: allergies, medications, problem list, medical history, surgical history, family history, social history, and previous encounter notes.  I  have reviewed the above documentation for accuracy and completeness, and I agree with the above. Jamie Bean, D.O.  The 21st Century Cures Act was signed into law in 2016 which includes the topic of electronic health records.  This provides immediate access to information in MyChart. This includes consultation notes, operative notes, office notes, lab results and pathology reports.  If you have any questions about what you read please let us  know at your next visit so we can discuss your concerns and take corrective action if need be.  We are right here with you.

## 2024-02-28 LAB — COMPREHENSIVE METABOLIC PANEL WITH GFR
ALT: 11 IU/L (ref 0–32)
AST: 16 IU/L (ref 0–40)
Albumin: 4.3 g/dL (ref 3.9–4.9)
Alkaline Phosphatase: 48 IU/L (ref 41–116)
BUN/Creatinine Ratio: 23 (ref 9–23)
BUN: 20 mg/dL (ref 6–24)
Bilirubin Total: 0.5 mg/dL (ref 0.0–1.2)
CO2: 23 mmol/L (ref 20–29)
Calcium: 9.5 mg/dL (ref 8.7–10.2)
Chloride: 103 mmol/L (ref 96–106)
Creatinine, Ser: 0.88 mg/dL (ref 0.57–1.00)
Globulin, Total: 2.7 g/dL (ref 1.5–4.5)
Glucose: 86 mg/dL (ref 70–99)
Potassium: 4.4 mmol/L (ref 3.5–5.2)
Sodium: 138 mmol/L (ref 134–144)
Total Protein: 7 g/dL (ref 6.0–8.5)
eGFR: 85 mL/min/1.73 (ref >59)

## 2024-02-28 LAB — LIPID PANEL
Chol/HDL Ratio: 3.5 ratio (ref 0.0–4.4)
Cholesterol, Total: 194 mg/dL (ref 100–199)
HDL: 56 mg/dL (ref >39)
LDL Chol Calc (NIH): 130 mg/dL — ABNORMAL HIGH (ref 0–99)
Triglycerides: 41 mg/dL (ref 0–149)
VLDL Cholesterol Cal: 8 mg/dL (ref 5–40)

## 2024-02-28 LAB — HEMOGLOBIN A1C
Est. average glucose Bld gHb Est-mCnc: 114 mg/dL
Hgb A1c MFr Bld: 5.6 % (ref 4.8–5.6)

## 2024-02-28 LAB — VITAMIN D 25 HYDROXY (VIT D DEFICIENCY, FRACTURES): Vit D, 25-Hydroxy: 53.7 ng/mL (ref 30.0–100.0)

## 2024-02-28 LAB — INSULIN, RANDOM: INSULIN: 4.7 u[IU]/mL (ref 2.6–24.9)

## 2024-03-18 ENCOUNTER — Encounter (INDEPENDENT_AMBULATORY_CARE_PROVIDER_SITE_OTHER): Payer: Self-pay | Admitting: Family Medicine

## 2024-03-18 ENCOUNTER — Ambulatory Visit (INDEPENDENT_AMBULATORY_CARE_PROVIDER_SITE_OTHER): Payer: Self-pay | Admitting: Family Medicine

## 2024-03-18 DIAGNOSIS — Z6834 Body mass index (BMI) 34.0-34.9, adult: Secondary | ICD-10-CM

## 2024-03-18 DIAGNOSIS — E7841 Elevated Lipoprotein(a): Secondary | ICD-10-CM | POA: Diagnosis not present

## 2024-03-18 DIAGNOSIS — E559 Vitamin D deficiency, unspecified: Secondary | ICD-10-CM

## 2024-03-18 DIAGNOSIS — Z6832 Body mass index (BMI) 32.0-32.9, adult: Secondary | ICD-10-CM

## 2024-03-18 DIAGNOSIS — R7303 Prediabetes: Secondary | ICD-10-CM

## 2024-03-18 MED ORDER — VITAMIN D (CHOLECALCIFEROL) 25 MCG (1000 UT) PO TABS: ORAL_TABLET | ORAL | Status: AC

## 2024-03-18 NOTE — Progress Notes (Signed)
 Jamie Bean, D.O.  ABFM, ABOM Specializing in Clinical Bariatric Medicine  Office located at: [address] [place], [place]  [address]      A) FOR THE CHRONIC DISEASE OF OBESITY:  Chief complaint: Obesity Jamie Bean is here to discuss her progress with her obesity treatment plan.   History of present illness / Interval history:  Jamie Bean is here today for her follow-up office visit.  Since last OV on 02/27/24, pt is down 2 lbs. Patient states that she has been busy with work and has been eating the same foods for breakfast and lunch. She endorses that the only thing that varies is her dinner.     02/27/24 09:00 03/18/24 10:00   Body Fat % 40.9 % 36.4 %  Muscle Mass (lbs) 125.2 lbs 133.6 lbs  Fat Mass (lbs) 91.2 lbs 80.4 lbs  Total Body Water  (lbs) 87 lbs 87.6 lbs  Visceral Fat Rating  9 8    Counseling done on how various foods will affect these numbers and how to maximize success   Total lbs lost to date: - 29 lbs Total Fat Mass in lbs lost to date: - 18.8 lbs Total weight loss percentage to date: - 11.60 %    Morbid obesity starting (HCC) 36.4 BMI 34.0-34.9,adult current 32.18  Nutrition Therapy She is journaling 1450 cal and 115 + grams protein and states she is following her eating plan approximately 75 % of the time.   - Tracking Calories/Macros: no   - Eating More Whole Foods: yes  - Adequate Protein Intake: yes  - Adequate Water  Intake: yes  - Skipping Meals: no   - Sleeping 7-9 Hours/ Night: yes   Jamie Bean is currently in the action stage of change. As such, her goal is to continue weight management plan.  She has agreed to: Cat 2 with the option to Journal dinner 450-500 calories with 40 + g proteins   Physical Activity Jamie Bean is doing zumba and weight training 20-30  minutes 4 days per week   Jamie Bean has been advised to work up to 300-450 minutes of moderate intensity aerobic activity a week and strengthening exercises 2-3  times per week for cardiovascular health, weight loss maintenance and preservation of muscle mass.  She has agreed to : Increase volume of physical activity to a goal of 240 minutes a week and Combine aerobic and strengthening exercises for efficiency and improved cardiometabolic health.   Behavioral Modifications Evidence-based interventions for health behavior change were utilized today including the discussion of  1) self monitoring techniques:    - Work on being consistent  2) problem-solving barriers:    - Journal dinner  3) SMART goals for next OV:    - Add 1 more day of Zumba  Regarding patient's less desirable eating habits and patterns, we employed the technique of small changes.   We discussed the following today: increasing lean protein intake to established goals and work on tracking and journaling calories using tracking application Additional resources provided today: None   Medical Interventions/ Pharmacotherapy Previous Bariatric surgery: n/a Pharmacotherapy for weight loss: She is not currently taking medications  for medical weight loss.    We discussed various medication options to help Jamie Bean with her weight loss efforts and we both agreed to : Continue with current nutritional and behavioral strategies   B) OBESITY RELATED CONDITIONS ADDRESSED TODAY:  Prediabetes Assessment & Plan Lab Results  Component Value Date   HGBA1C 5.6 02/27/2024  HGBA1C 5.9 (H) 09/11/2023   HGBA1C 5.6 08/06/2023   INSULIN  4.7 02/27/2024   INSULIN  7.8 09/11/2023   INSULIN  7.3 07/01/2018   CMP     Component Value Date/Time   NA 138 02/27/2024 1037   K 4.4 02/27/2024 1037   CL 103 02/27/2024 1037   CO2 23 02/27/2024 1037   GLUCOSE 86 02/27/2024 1037   GLUCOSE 98 08/06/2023 1134   BUN 20 02/27/2024 1037   CREATININE 0.88 02/27/2024 1037   CREATININE 0.77 09/05/2019 1453   CALCIUM  9.5 02/27/2024 1037   PROT 7.0 02/27/2024 1037   ALBUMIN 4.3 02/27/2024 1037   AST 16  02/27/2024 1037   ALT 11 02/27/2024 1037   ALKPHOS 48 02/27/2024 1037   BILITOT 0.5 02/27/2024 1037   GFR 78.41 08/06/2023 1134   EGFR 85 02/27/2024 1037   GFRNONAA >60 05/17/2023 1842   Patient is not currently on any medication, diet and life controlled. A1C levels have decreased from 5.9 to 5.6. Fasting insulin  levels are at goal below 5. Current level is 4.7. Patients pre-dm is well controlled. Patient has her Metformin  medication but has not started it due to her still breastfeeding. She intends to start next year when she is done breastfeeding.   Patients kidney function is at goal. Bun levels show that they were slightly elevated than before which could show that she is dehydrated. Encouraged patient to drink half of her body weight in oz to help stay hydrated. Electrolytes and liver enzymes are at goal. Albumin levels show that they have gotten better. Continue following prudent meal plan and decreasing simple carbs and sugars.      Vitamin D  deficiency Assessment & Plan Lab Results  Component Value Date   VD25OH 53.7 02/27/2024   VD25OH 42.7 09/11/2023   VD25OH 31.89 08/06/2023   Currently on OTC Vit D 1000 lU daily. With good compliance and tolerance. Vit D levels are at goal. Explained to patient how Vit D levels are important. Continue with supplementation.      Elevated lipoprotein(a) Assessment & Plan Lab Results  Component Value Date   CHOL 194 02/27/2024   HDL 56 02/27/2024   LDLCALC 130 (H) 02/27/2024   TRIG 41 02/27/2024   CHOLHDL 3.5 02/27/2024   Managed with diet and lifestyle. LDL levels have stayed the same. Explained to her that this could due to genes. HDL levels have increased from 53 to 56. Goal levels are 60 and above. As patient increases her exercise HDL levels will increase. Patients ASCVD risk was calculated in office today and was at a 0.2%. This is better than optimal. Continue with following prudent meal plan and decreasing foods high in saturated  and trans fat.     Medications Discontinued During This Encounter  Medication Reason   Vitamin D , Cholecalciferol , 25 MCG (1000 UT) TABS Reorder     Meds ordered this encounter  Medications   Vitamin D , Cholecalciferol , 25 MCG (1000 UT) TABS    Sig: 1000 IU daily     Follow up:   Return 04/16/2024 at 10:20 AM  She was informed of the importance of frequent follow up visits to maximize her success with intensive lifestyle modifications for her multiple health conditions.   Weight Summary and Biometrics   Weight Lost Since Last Visit: 2lb  Weight Gained Since Last Visit: 0lb  Vitals Temp: 98 F (36.7 C) BP: 104/69 Pulse Rate: 73 SpO2: 96 %   Anthropometric Measurements Height: 5' 9.5 (1.765 m) Weight: 221  lb (100.2 kg) BMI (Calculated): 32.18 Weight at Last Visit: 223lb Weight Lost Since Last Visit: 2lb Weight Gained Since Last Visit: 0lb Starting Weight: 250lb Total Weight Loss (lbs): 29 lb (13.2 kg) Peak Weight: 260lb   Body Composition  Body Fat %: 36.4 % Fat Mass (lbs): 80.4 lbs Muscle Mass (lbs): 133.6 lbs Total Body Water  (lbs): 87.6 lbs Visceral Fat Rating : 8   Other Clinical Data Fasting: no Labs: no Today's Visit #: 7 Starting Date: 09/11/23    Objective:   PHYSICAL EXAM: Blood pressure 104/69, pulse 73, temperature 98 F (36.7 C), height 5' 9.5 (1.765 m), weight 221 lb (100.2 kg), last menstrual period 12/01/2023, SpO2 96%, currently breastfeeding. Body mass index is 32.17 kg/m.  General: she is overweight, cooperative and in no acute distress. PSYCH: Has normal mood, affect and thought process.   HEENT: EOMI, sclerae are anicteric. Lungs: Normal breathing effort, no conversational dyspnea. Extremities: Moves * 4 Neurologic: A and O * 3, good insight  DIAGNOSTIC DATA REVIEWED: BMET    Component Value Date/Time   NA 138 02/27/2024 1037   K 4.4 02/27/2024 1037   CL 103 02/27/2024 1037   CO2 23 02/27/2024 1037   GLUCOSE 86  02/27/2024 1037   GLUCOSE 98 08/06/2023 1134   BUN 20 02/27/2024 1037   CREATININE 0.88 02/27/2024 1037   CREATININE 0.77 09/05/2019 1453   CALCIUM  9.5 02/27/2024 1037   GFRNONAA >60 05/17/2023 1842   GFRAA 130 04/06/2020 0837   Lab Results  Component Value Date   HGBA1C 5.6 02/27/2024   HGBA1C 5.6 08/28/2016   Lab Results  Component Value Date   INSULIN  4.7 02/27/2024   INSULIN  5.8 10/09/2017   Lab Results  Component Value Date   TSH 0.798 09/11/2023   CBC    Component Value Date/Time   WBC 6.0 09/11/2023 0907   WBC 6.1 08/06/2023 1134   RBC 4.77 09/11/2023 0907   RBC 4.51 08/06/2023 1134   HGB 12.8 09/11/2023 0907   HCT 38.9 09/11/2023 0907   PLT 194 09/11/2023 0907   MCV 82 09/11/2023 0907   MCH 26.8 09/11/2023 0907   MCH 27.4 05/17/2023 1842   MCHC 32.9 09/11/2023 0907   MCHC 32.9 08/06/2023 1134   RDW 13.1 09/11/2023 0907   Iron  Studies    Component Value Date/Time   IRON  90 07/26/2022 1540   TIBC 295.4 07/26/2022 1540   FERRITIN 44.8 07/26/2022 1540   IRONPCTSAT 30.5 07/26/2022 1540   IRONPCTSAT 47 (H) 09/09/2020 0941   Lipid Panel     Component Value Date/Time   CHOL 194 02/27/2024 1037   TRIG 41 02/27/2024 1037   HDL 56 02/27/2024 1037   CHOLHDL 3.5 02/27/2024 1037   CHOLHDL 4 08/06/2023 1134   VLDL 8.4 08/06/2023 1134   LDLCALC 130 (H) 02/27/2024 1037   LDLCALC 118 (H) 09/05/2019 1453   Hepatic Function Panel     Component Value Date/Time   PROT 7.0 02/27/2024 1037   ALBUMIN 4.3 02/27/2024 1037   AST 16 02/27/2024 1037   ALT 11 02/27/2024 1037   ALKPHOS 48 02/27/2024 1037   BILITOT 0.5 02/27/2024 1037      Component Value Date/Time   TSH 0.798 09/11/2023 0907   Nutritional Lab Results  Component Value Date   VD25OH 53.7 02/27/2024   VD25OH 42.7 09/11/2023   VD25OH 31.89 08/06/2023    Attestations:   LILLETTE Sonny Laroche, acting as a stage manager for [employer], DO., have compiled all  relevant documentation for today's  office visit on behalf of Jamie Jenkins, DO, while in the presence of [employer], DO.  I have reviewed the above documentation for accuracy and completeness, and I agree with the above. Jamie Bean, D.O.  The 21st Century Cures Act was signed into law in 2016 which includes the topic of electronic health records.  This provides immediate access to information in MyChart.  This includes consultation notes, operative notes, office notes, lab results and pathology reports.  If you have any questions about what you read please let us  know at your next visit so we can discuss your concerns and take corrective action if need be.  We are right here with you.

## 2024-04-16 ENCOUNTER — Encounter (INDEPENDENT_AMBULATORY_CARE_PROVIDER_SITE_OTHER): Payer: Self-pay | Admitting: Family Medicine

## 2024-04-16 ENCOUNTER — Ambulatory Visit (INDEPENDENT_AMBULATORY_CARE_PROVIDER_SITE_OTHER): Payer: Self-pay | Admitting: Family Medicine

## 2024-04-16 VITALS — BP 106/7 | HR 81 | Temp 98.7°F | Ht 69.5 in | Wt 216.0 lb

## 2024-04-16 DIAGNOSIS — Z6834 Body mass index (BMI) 34.0-34.9, adult: Secondary | ICD-10-CM

## 2024-04-16 DIAGNOSIS — Z6832 Body mass index (BMI) 32.0-32.9, adult: Secondary | ICD-10-CM

## 2024-04-16 DIAGNOSIS — R7303 Prediabetes: Secondary | ICD-10-CM

## 2024-04-16 DIAGNOSIS — E559 Vitamin D deficiency, unspecified: Secondary | ICD-10-CM

## 2024-04-16 NOTE — Progress Notes (Signed)
 Jamie Bean, D.O.  ABFM, ABOM Specializing in Clinical Bariatric Medicine  Office located at: [address] [place], [place]  [address]    FOR THE CHRONIC DISEASE OF OBESITY:   Morbid obesity starting (HCC) 36.4 BMI 34.0-34.9,adult current 32.18  Weight Summary and Body Composition Analysis  Weight Lost Since Last Visit: 5 lb  Weight Gained Since Last Visit: 0    Vitals Temp: 98.7 F (37.1 C) BP: (!) 106/7 Pulse Rate: 81 SpO2: 97 %   Anthropometric Measurements Height: 5' 9.5 (1.765 m) Weight: 216 lb (98 kg) BMI (Calculated): 31.45 Weight at Last Visit: 221 lb Weight Lost Since Last Visit: 5 lb Weight Gained Since Last Visit: 0 Starting Weight: 250 lb Total Weight Loss (lbs): 34 lb (15.4 kg) Peak Weight: 260 lb   Body Composition  Body Fat %: 39.2 % Fat Mass (lbs): 84.8 lbs Muscle Mass (lbs): 125 lbs Total Body Water  (lbs): 84.6 lbs Visceral Fat Rating : 8   Other Clinical Data Fasting: No Labs: No Today's Visit #: 8 Starting Date: 09/11/23    Chief complaint: Obesity  Interval History Jamie Bean is here for a follow-up office visit to discuss her progress with her obesity treatment plan. She is on the Category 2 Plan with the option to journal 450-500 cal and 40+ grams protein for dinner and states she is following her eating plan approximately 80 % of the time. She is doing weight lifting and Zumba 20 minutes, 4-5 days per week  She has experienced a weight loss of 5 lbs since last OV on 03/18/2024.   She enjoyed her Thanksgiving but acknowledges overindulging in deserts.   She was also sick with strep throat for a couple of days.   Her dietary and life habits include:  - Tracking Calories/Macros: no  - Eating More Whole Foods: yes  - Adequate Protein Intake: yes  - Adequate Water  Intake: yes  - Skipping Meals: no  - Sleeping 7-9 Hours/ Night: yes    03/18/24 10:00 04/16/24 10:00   Body Fat % 36.4 % 39.2 %  Muscle  Mass (lbs) 133.6 lbs 125 lbs  Fat Mass (lbs) 80.4 lbs 84.8 lbs  Total Body Water  (lbs) 87.6 lbs 84.6 lbs  Visceral Fat Rating  8 8   Counseling done on how various foods will affect these numbers and how to maximize success  Total Fat mass lost to date: -17.4  lbs Total lbs lost to date: - 34 lbs Total weight loss percentage to date: - 13.60 %   Nutritional and Behavioral Counseling:  We discussed the following today: creating time for self-care, increasing lean protein intake to established goals, high protein snacking choices, and increasing fiber rich foods  Additional resources provided today: Handout on Healthy [employer] , Handout on benefits of 5-10% weight loss, and Handout on December Goals, Handout on holiday recipes.   Evidence-based interventions for health behavior change were utilized today including the discussion of self monitoring techniques, problem-solving barriers and SMART goal setting techniques.   Regarding patient's less desirable eating habits and patterns, we employed the technique of small changes.   SMART Goal(s) created today: purposefully add late afternoon healthy high protein snack (e.g Tuna Packet) + maintain weight through holidays.    Recommended Dietary Goals Jamie Bean is currently in the action stage of change. As such, her goal is to continue weight management plan.  She has agreed to continue the Category 2 Plan with the option to journal  450-500 cal and 40+ grams protein for dinner.   Recommended Physical Activity Goals Jamie Bean has been advised to work up to 300-450 minutes of moderate intensity aerobic activity a week and strengthening exercises 2-3 times per week for cardiovascular health, weight loss maintenance and preservation of muscle mass.   She has agreed to Continue to gradually increase the amount and intensity of exercise routine.    Medical Interventions and Pharmacotherapy Previous Bariatric surgery:  n/a Pharmacotherapy: See Pre-DM note.     OBESITY RELATED CONDITIONS ADDRESSED TODAY:   Prediabetes Assessment & Plan: Lab Results  Component Value Date   HGBA1C 5.6 02/27/2024   HGBA1C 5.9 (H) 09/11/2023   HGBA1C 5.6 08/06/2023   INSULIN  4.7 02/27/2024   INSULIN  7.8 09/11/2023   INSULIN  7.3 07/01/2018   Pre-DM improving. She struggled with sweet cravings over Thanksgiving but has gotten rid of all the sweets in her home since then. She plans to start Metformin  at the beginning of next year as she is still weaning off breast feeding. Continue working on nutrition plan and exercise to promote weight loss and improve glycemic control and prevent progression to T2DM.    Vitamin D  deficiency Assessment & Plan: Lab Results  Component Value Date   VD25OH 53.7 02/27/2024   VD25OH 42.7 09/11/2023   VD25OH 31.89 08/06/2023   She is doing well on OTC Vit D 1,000 lU daily; continue supplementation Recheck as deemed medically necessary.   Objective:   PHYSICAL EXAM: Blood pressure (!) 106/7, pulse 81, temperature 98.7 F (37.1 C), height 5' 9.5 (1.765 m), weight 216 lb (98 kg), SpO2 97%, currently breastfeeding. Body mass index is 31.44 kg/m.  General: she is overweight, cooperative and in no acute distress. PSYCH: Has normal mood, affect and thought process.   HEENT: EOMI, sclerae are anicteric. Lungs: Normal breathing effort, no conversational dyspnea. Extremities: Moves * 4 Neurologic: A and O * 3, good insight  DIAGNOSTIC DATA REVIEWED: BMET    Component Value Date/Time   NA 138 02/27/2024 1037   K 4.4 02/27/2024 1037   CL 103 02/27/2024 1037   CO2 23 02/27/2024 1037   GLUCOSE 86 02/27/2024 1037   GLUCOSE 98 08/06/2023 1134   BUN 20 02/27/2024 1037   CREATININE 0.88 02/27/2024 1037   CREATININE 0.77 09/05/2019 1453   CALCIUM  9.5 02/27/2024 1037   GFRNONAA >60 05/17/2023 1842   GFRAA 130 04/06/2020 0837   Lab Results  Component Value Date   HGBA1C 5.6  02/27/2024   HGBA1C 5.6 08/28/2016   Lab Results  Component Value Date   INSULIN  4.7 02/27/2024   INSULIN  5.8 10/09/2017   Lab Results  Component Value Date   TSH 0.798 09/11/2023   CBC    Component Value Date/Time   WBC 6.0 09/11/2023 0907   WBC 6.1 08/06/2023 1134   RBC 4.77 09/11/2023 0907   RBC 4.51 08/06/2023 1134   HGB 12.8 09/11/2023 0907   HCT 38.9 09/11/2023 0907   PLT 194 09/11/2023 0907   MCV 82 09/11/2023 0907   MCH 26.8 09/11/2023 0907   MCH 27.4 05/17/2023 1842   MCHC 32.9 09/11/2023 0907   MCHC 32.9 08/06/2023 1134   RDW 13.1 09/11/2023 0907   Iron  Studies    Component Value Date/Time   IRON  90 07/26/2022 1540   TIBC 295.4 07/26/2022 1540   FERRITIN 44.8 07/26/2022 1540   IRONPCTSAT 30.5 07/26/2022 1540   IRONPCTSAT 47 (H) 09/09/2020 0941   Lipid Panel     Component  Value Date/Time   CHOL 194 02/27/2024 1037   TRIG 41 02/27/2024 1037   HDL 56 02/27/2024 1037   CHOLHDL 3.5 02/27/2024 1037   CHOLHDL 4 08/06/2023 1134   VLDL 8.4 08/06/2023 1134   LDLCALC 130 (H) 02/27/2024 1037   LDLCALC 118 (H) 09/05/2019 1453   Hepatic Function Panel     Component Value Date/Time   PROT 7.0 02/27/2024 1037   ALBUMIN 4.3 02/27/2024 1037   AST 16 02/27/2024 1037   ALT 11 02/27/2024 1037   ALKPHOS 48 02/27/2024 1037   BILITOT 0.5 02/27/2024 1037      Component Value Date/Time   TSH 0.798 09/11/2023 0907   Nutritional Lab Results  Component Value Date   VD25OH 53.7 02/27/2024   VD25OH 42.7 09/11/2023   VD25OH 31.89 08/06/2023     Follow up:   Return 05/12/2024 at 10:40 AM.  She was informed of the importance of frequent follow up visits to maximize her success with intensive lifestyle modifications for her multiple health conditions.   Attestations:   I, Special Puri, acting as a stage manager for [employer], DO., have compiled all relevant documentation for today's office visit on behalf of Jamie Jenkins, DO, while in the presence of  [employer], DO.  Pertinent positives were addressed with patient today. Reviewed by clinician on day of visit: allergies, medications, problem list, medical history, surgical history, family history, social history, and previous encounter notes.  I have reviewed the above documentation for accuracy and completeness, and I agree with the above. Jamie JINNY Bean, D.O.  The 21st Century Cures Act was signed into law in 2016 which includes the topic of electronic health records.  This provides immediate access to information in MyChart. This includes consultation notes, operative notes, office notes, lab results and pathology reports.  If you have any questions about what you read please let us  know at your next visit so we can discuss your concerns and take corrective action if need be.  We are right here with you.

## 2024-05-12 ENCOUNTER — Ambulatory Visit (INDEPENDENT_AMBULATORY_CARE_PROVIDER_SITE_OTHER): Admitting: Family Medicine

## 2024-05-12 ENCOUNTER — Encounter (INDEPENDENT_AMBULATORY_CARE_PROVIDER_SITE_OTHER): Payer: Self-pay | Admitting: Family Medicine

## 2024-05-12 DIAGNOSIS — Z6831 Body mass index (BMI) 31.0-31.9, adult: Secondary | ICD-10-CM | POA: Diagnosis not present

## 2024-05-12 DIAGNOSIS — R7303 Prediabetes: Secondary | ICD-10-CM

## 2024-05-12 DIAGNOSIS — Z6834 Body mass index (BMI) 34.0-34.9, adult: Secondary | ICD-10-CM

## 2024-05-12 DIAGNOSIS — E559 Vitamin D deficiency, unspecified: Secondary | ICD-10-CM

## 2024-05-12 NOTE — Progress Notes (Signed)
 "  Jamie DOROTHA Bean, D.O.  ABFM, ABOM Specializing in Clinical Bariatric Medicine  Office located at: [address] [place], [place]  [address]      A) FOR THE CHRONIC DISEASE OF OBESITY:  Morbid obesity starting (HCC) 36.4 BMI 34.0-34.9,adult current 31.89  Chief complaint: Obesity Jamie Bean is here to discuss her progress with her obesity treatment plan.   History of present illness / Interval history:  Jamie Bean is here today for her follow-up office visit.  Since last OV on 04/16/2024, pt is up 3 lbs.    04/16/24 10:00 05/12/24 10:00   Body Fat % 39.2 % 40.3 %  Muscle Mass (lbs) 125 lbs 124.2 lbs  Fat Mass (lbs) 84.8 lbs 88.2 lbs  Total Body Water  (lbs) 84.6 lbs 86.4 lbs  Visceral Fat Rating  8 8  Counseling done on how various foods will affect these numbers and how to maximize success   Total lbs lost to date: -31 lbs Total Fat Mass in lbs lost to date: -10 Total weight loss percentage to date: -12.40 %   Nutrition Therapy She is on the Category 2 Plan with the option to journal 450-500 cal and 40+ grams protein for dinner and states she is following her eating plan approximately 75 % of the time.   - Tracking Calories/Macros: no - ***  - Eating More Whole Foods: yes  - Adequate Protein Intake: yes  - Adequate Water  Intake: yes  - Skipping Meals: no - ***  - Sleeping 7-9 Hours/ Night: yes   Jamie Bean is currently in the action stage of change. As such, her goal is to continue weight management plan.  She has agreed to: continue current plan   Physical Activity Pt  is weight training or dancing for 20-30  minutes 3-4 days per week   Jamie Bean has been advised to work up to 300-450 minutes of moderate intensity aerobic activity a week and strengthening exercises 2-3 times per week for cardiovascular health, weight loss maintenance and preservation of muscle mass.  She has agreed to : Continue current level of physical activity , Increase physical  activity in their day and reduce sedentary time (increase NEAT)., Increase volume of physical activity to a goal of 240 minutes a week, and Combine aerobic and strengthening exercises for efficiency and improved cardiometabolic health.   Behavioral Modifications Evidence-based interventions for health behavior change were utilized today including the discussion of  1) self monitoring techniques:  tracking caloric and protein intake 2) problem-solving barriers:  n/a 3) self care:  exercising  4) SMART goals for next OV:  Creating a supportive home environment and meal prepping. Regarding patient's less desirable eating habits and patterns, we employed the technique of small changes.   We discussed the following today: increasing lean protein intake to established goals, decreasing simple carbohydrates , increasing water  intake , work on meal planning and preparation, keeping healthy foods at home, avoiding temptations and identifying enticing environmental cues, and continue to work on implementation of reduced calorie nutritional plan Additional resources provided today: None   Medical Interventions/ Pharmacotherapy Previous Bariatric surgery: none Pharmacotherapy for weight loss: She is not currently taking medications  for medical weight loss.    We discussed various medication options to help Jamie Bean with her weight loss efforts and we both agreed to : Continue with current nutritional and behavioral strategies   B) OBESITY RELATED CONDITIONS ADDRESSED TODAY:   Prediabetes Assessment & Plan Lab Results  Component Value Date  HGBA1C 5.6 02/27/2024   HGBA1C 5.9 (H) 09/11/2023   HGBA1C 5.6 08/06/2023   INSULIN  4.7 02/27/2024   INSULIN  7.8 09/11/2023   INSULIN  7.3 07/01/2018  Prescribed Metformin  250 mg twice daily; is not currently taking medication because she is breastfeeding. Hunger is controlled. Reports having increased sweet cravings. No acute concerns today. Discussed  different medications to help aid weight loss such as Mounjaro. Pt declines for now; will revisit in the future. Cont decreasing simple carbs/sugars and increasing lean protein. Increase exercise as able.     Vitamin D  deficiency Assessment & Plan Lab Results  Component Value Date   VD25OH 53.7 02/27/2024   VD25OH 42.7 09/11/2023   VD25OH 31.89 08/06/2023  Currently on OTC Vit D 1,000 units daily with good compliance and tolerance. Vit D levels are at goal. No acute concerns today. Cont regimen. Will recheck levels as necessary.       Follow up:   Return 06/09/2024 11:00 AM.  She was informed of the importance of frequent follow up visits to maximize her success with intensive lifestyle modifications for her multiple health conditions.   Weight Summary and Biometrics   Weight Lost Since Last Visit: 0lb  Weight Gained Since Last Visit: 3lb    Vitals Temp: 98.8 F (37.1 C) BP: 124/66 Pulse Rate: 68 SpO2: 96 %   Anthropometric Measurements Height: 5' 9.5 (1.765 m) Weight: 219 lb (99.3 kg) BMI (Calculated): 31.89 Weight at Last Visit: 216lb Weight Lost Since Last Visit: 0lb Weight Gained Since Last Visit: 3lb Starting Weight: 250lb Total Weight Loss (lbs): 31 lb (14.1 kg) Peak Weight: 260lb   Body Composition  Body Fat %: 40.3 % Fat Mass (lbs): 88.2 lbs Muscle Mass (lbs): 124.2 lbs Total Body Water  (lbs): 86.4 lbs Visceral Fat Rating : 8   Other Clinical Data Fasting: no Labs: no Today's Visit #: 9 Starting Date: 09/11/23    Objective:   PHYSICAL EXAM: Blood pressure 124/66, pulse 68, temperature 98.8 F (37.1 C), height 5' 9.5 (1.765 m), weight 219 lb (99.3 kg), last menstrual period 04/22/2024, SpO2 96%, currently breastfeeding. Body mass index is 31.88 kg/m.  General: she is overweight, cooperative and in no acute distress. PSYCH: Has normal mood, affect and thought process.   HEENT: EOMI, sclerae are anicteric. Lungs: Normal breathing  effort, no conversational dyspnea. Extremities: Moves * 4 Neurologic: A and O * 3, good insight  DIAGNOSTIC DATA REVIEWED: BMET    Component Value Date/Time   NA 138 02/27/2024 1037   K 4.4 02/27/2024 1037   CL 103 02/27/2024 1037   CO2 23 02/27/2024 1037   GLUCOSE 86 02/27/2024 1037   GLUCOSE 98 08/06/2023 1134   BUN 20 02/27/2024 1037   CREATININE 0.88 02/27/2024 1037   CREATININE 0.77 09/05/2019 1453   CALCIUM  9.5 02/27/2024 1037   GFRNONAA >60 05/17/2023 1842   GFRAA 130 04/06/2020 0837   Lab Results  Component Value Date   HGBA1C 5.6 02/27/2024   HGBA1C 5.6 08/28/2016   Lab Results  Component Value Date   INSULIN  4.7 02/27/2024   INSULIN  5.8 10/09/2017   Lab Results  Component Value Date   TSH 0.798 09/11/2023   CBC    Component Value Date/Time   WBC 6.0 09/11/2023 0907   WBC 6.1 08/06/2023 1134   RBC 4.77 09/11/2023 0907   RBC 4.51 08/06/2023 1134   HGB 12.8 09/11/2023 0907   HCT 38.9 09/11/2023 0907   PLT 194 09/11/2023 0907   MCV 82 09/11/2023  9092   MCH 26.8 09/11/2023 0907   MCH 27.4 05/17/2023 1842   MCHC 32.9 09/11/2023 0907   MCHC 32.9 08/06/2023 1134   RDW 13.1 09/11/2023 0907   Iron  Studies    Component Value Date/Time   IRON  90 07/26/2022 1540   TIBC 295.4 07/26/2022 1540   FERRITIN 44.8 07/26/2022 1540   IRONPCTSAT 30.5 07/26/2022 1540   IRONPCTSAT 47 (H) 09/09/2020 0941   Lipid Panel     Component Value Date/Time   CHOL 194 02/27/2024 1037   TRIG 41 02/27/2024 1037   HDL 56 02/27/2024 1037   CHOLHDL 3.5 02/27/2024 1037   CHOLHDL 4 08/06/2023 1134   VLDL 8.4 08/06/2023 1134   LDLCALC 130 (H) 02/27/2024 1037   LDLCALC 118 (H) 09/05/2019 1453   Hepatic Function Panel     Component Value Date/Time   PROT 7.0 02/27/2024 1037   ALBUMIN 4.3 02/27/2024 1037   AST 16 02/27/2024 1037   ALT 11 02/27/2024 1037   ALKPHOS 48 02/27/2024 1037   BILITOT 0.5 02/27/2024 1037      Component Value Date/Time   TSH 0.798 09/11/2023 0907    Nutritional Lab Results  Component Value Date   VD25OH 53.7 02/27/2024   VD25OH 42.7 09/11/2023   VD25OH 31.89 08/06/2023    Attestations:   Jamie Bean, acting as a stage manager for Jamie Jenkins, DO., have compiled all relevant documentation for today's office visit on behalf of Jamie Jenkins, DO, while in the presence of [employer], DO.   I have reviewed the above documentation for accuracy and completeness, and I agree with the above. Jamie Bean, D.O.  The 21st Century Cures Act was signed into law in 2016 which includes the topic of electronic health records.  This provides immediate access to information in MyChart.  This includes consultation notes, operative notes, office notes, lab results and pathology reports.  If you have any questions about what you read please let us  know at your next visit so we can discuss your concerns and take corrective action if need be.  We are right here with you.  "

## 2024-05-13 ENCOUNTER — Encounter: Payer: Self-pay | Admitting: Family

## 2024-05-13 ENCOUNTER — Ambulatory Visit: Admitting: Family

## 2024-05-13 VITALS — BP 126/80 | HR 77 | Temp 98.3°F | Ht 69.5 in | Wt 218.2 lb

## 2024-05-13 DIAGNOSIS — J029 Acute pharyngitis, unspecified: Secondary | ICD-10-CM | POA: Insufficient documentation

## 2024-05-13 NOTE — Progress Notes (Unsigned)
 "  Assessment & Plan:  There are no diagnoses linked to this encounter.   Return precautions given.   Risks, benefits, and alternatives of the medications and treatment plan prescribed today were discussed, and patient expressed understanding.   Education regarding symptom management and diagnosis given to patient on AVS either electronically or printed.  No follow-ups on file.  [name], FNP  Subjective:    Patient ID: Jamie Bean, female    DOB: [birth year], 39 y.o.   MRN: [id number]  CC: Jamie Bean is a 39 y.o. female who presents today for an acute visit.    HPI: HPI Concern for recurrent strep throat , confirmed positive 04/19/24 ( treated for 10 days with cephalexin), 03/11/24 ( treated with amoxicillin x 10 days)  Suspected positive with left sided white exudate tonsil and started amoxicillin 2 days ago A1c 5.6  Allergies: Sulfa antibiotics Medications Ordered Prior to Encounter[1]  Review of Systems    Objective:    BP 126/80   Pulse 77   Temp 98.3 F (36.8 C)   Ht 5' 9.5 (1.765 m)   Wt 218 lb 3.2 oz (99 kg)   LMP 04/22/2024   SpO2 97%   BMI 31.76 kg/m   BP Readings from Last 3 Encounters:  05/13/24 126/80  05/12/24 124/66  04/16/24 (!) 106/7   Wt Readings from Last 3 Encounters:  05/13/24 218 lb 3.2 oz (99 kg)  05/12/24 219 lb (99.3 kg)  04/16/24 216 lb (98 kg)    Physical Exam Vitals reviewed.  Constitutional:      Appearance: She is well-developed.  HENT:     Head: Normocephalic and atraumatic.     Right Ear: Hearing, tympanic membrane, ear canal and external ear normal. No decreased hearing noted. No drainage, swelling or tenderness. No middle ear effusion. No foreign body. Tympanic membrane is not erythematous or bulging.     Left Ear: Hearing, tympanic membrane, ear canal and external ear normal. No decreased hearing noted. No drainage, swelling or tenderness.  No middle ear effusion. No foreign body. Tympanic membrane is not  erythematous or bulging.     Nose: Nose normal. No rhinorrhea.     Right Sinus: No maxillary sinus tenderness or frontal sinus tenderness.     Left Sinus: No maxillary sinus tenderness or frontal sinus tenderness.     Mouth/Throat:     Pharynx: Uvula midline. Posterior oropharyngeal erythema present. No oropharyngeal exudate.     Tonsils: No tonsillar abscesses.     Comments: Tonsils well behind pillars.  No exudate Eyes:     Conjunctiva/sclera: Conjunctivae normal.  Cardiovascular:     Rate and Rhythm: Regular rhythm.     Pulses: Normal pulses.     Heart sounds: Normal heart sounds.  Pulmonary:     Effort: Pulmonary effort is normal.     Breath sounds: Normal breath sounds. No wheezing, rhonchi or rales.  Lymphadenopathy:     Head:     Right side of head: No submental, submandibular, tonsillar, preauricular, posterior auricular or occipital adenopathy.     Left side of head: No submental, submandibular, tonsillar, preauricular, posterior auricular or occipital adenopathy.     Cervical: No cervical adenopathy.  Skin:    General: Skin is warm and dry.  Neurological:     Mental Status: She is alert.  Psychiatric:        Speech: Speech normal.        Behavior: Behavior normal.  Thought Content: Thought content normal.           [1]  Current Outpatient Medications on File Prior to Visit  Medication Sig Dispense Refill   Drospirenone (SLYND PO) Take 1 tablet by mouth daily.     Prenatal Vit-Fe Fumarate-FA (PRENATAL MULTIVITAMIN) TABS tablet Take 1 tablet by mouth at bedtime.     Vitamin D , Cholecalciferol , 25 MCG (1000 UT) TABS 1000 IU daily     metFORMIN  (GLUCOPHAGE ) 500 MG tablet 1/2 po with lunch and dinner daily (Patient not taking: Reported on 05/13/2024) 30 tablet 1   No current facility-administered medications on file prior to visit.   "

## 2024-05-14 NOTE — Assessment & Plan Note (Signed)
 Symptom improvement.  Afebrile . currently on amoxicillin 1000mg  twice daily x 7 days.  Advised to complete this dose.  Referral to ENT provided

## 2024-05-14 NOTE — Patient Instructions (Signed)
 Referral to [facility]  Let us  know if you dont hear back within 2 weeks in regards to an appointment being scheduled.   So that you are aware, if you are [facility] MyChart user , please pay attention to your MyChart messages as you may receive a MyChart message with a phone number to call and schedule this test/appointment own your own from our referral coordinator. This is a new process so I do not want you to miss this message.  If you are not a MyChart user, you will receive a phone call.

## 2024-06-09 ENCOUNTER — Ambulatory Visit (INDEPENDENT_AMBULATORY_CARE_PROVIDER_SITE_OTHER): Admitting: Family Medicine

## 2024-06-09 ENCOUNTER — Encounter (INDEPENDENT_AMBULATORY_CARE_PROVIDER_SITE_OTHER): Payer: Self-pay | Admitting: *Deleted

## 2024-06-25 ENCOUNTER — Ambulatory Visit (INDEPENDENT_AMBULATORY_CARE_PROVIDER_SITE_OTHER): Admitting: Family Medicine

## 2024-08-07 ENCOUNTER — Encounter: Admitting: Family
